# Patient Record
Sex: Female | Born: 1965 | Race: Black or African American | Hispanic: No | Smoking: Former smoker
Health system: Southern US, Community
[De-identification: ages and names within clinical notes are randomized; demographics above are authoritative.]

## PROBLEM LIST (undated history)

## (undated) DIAGNOSIS — R569 Unspecified convulsions: Secondary | ICD-10-CM

## (undated) DIAGNOSIS — K219 Gastro-esophageal reflux disease without esophagitis: Secondary | ICD-10-CM

## (undated) DIAGNOSIS — J309 Allergic rhinitis, unspecified: Secondary | ICD-10-CM

## (undated) DIAGNOSIS — R300 Dysuria: Secondary | ICD-10-CM

## (undated) DIAGNOSIS — G4730 Sleep apnea, unspecified: Secondary | ICD-10-CM

## (undated) DIAGNOSIS — F419 Anxiety disorder, unspecified: Secondary | ICD-10-CM

## (undated) DIAGNOSIS — R51 Headache: Secondary | ICD-10-CM

## (undated) DIAGNOSIS — D649 Anemia, unspecified: Secondary | ICD-10-CM

## (undated) DIAGNOSIS — I10 Essential (primary) hypertension: Secondary | ICD-10-CM

## (undated) DIAGNOSIS — L209 Atopic dermatitis, unspecified: Secondary | ICD-10-CM

## (undated) DIAGNOSIS — J45909 Unspecified asthma, uncomplicated: Secondary | ICD-10-CM

## (undated) DIAGNOSIS — C801 Malignant (primary) neoplasm, unspecified: Secondary | ICD-10-CM

## (undated) DIAGNOSIS — E538 Deficiency of other specified B group vitamins: Secondary | ICD-10-CM

## (undated) DIAGNOSIS — D120 Benign neoplasm of cecum: Secondary | ICD-10-CM

## (undated) DIAGNOSIS — E559 Vitamin D deficiency, unspecified: Secondary | ICD-10-CM

## (undated) DIAGNOSIS — F10250 Alcohol dependence with alcohol-induced psychotic disorder with delusions: Secondary | ICD-10-CM

## (undated) DIAGNOSIS — R519 Headache, unspecified: Secondary | ICD-10-CM

## (undated) DIAGNOSIS — G44019 Episodic cluster headache, not intractable: Secondary | ICD-10-CM

## (undated) DIAGNOSIS — D72819 Decreased white blood cell count, unspecified: Secondary | ICD-10-CM

## (undated) HISTORY — PX: VARICOSE VEIN SURGERY: SHX832

## (undated) HISTORY — DX: Decreased white blood cell count, unspecified: D72.819

## (undated) HISTORY — DX: Gastro-esophageal reflux disease without esophagitis: K21.9

## (undated) HISTORY — DX: Anemia, unspecified: D64.9

## (undated) HISTORY — DX: Anxiety disorder, unspecified: F41.9

## (undated) HISTORY — DX: Malignant (primary) neoplasm, unspecified: C80.1

## (undated) HISTORY — DX: Deficiency of other specified B group vitamins: E53.8

## (undated) HISTORY — DX: Essential (primary) hypertension: I10

## (undated) HISTORY — DX: Benign neoplasm of cecum: D12.0

---

## 1985-05-24 HISTORY — PX: THYROIDECTOMY: SHX17

## 1998-05-24 HISTORY — PX: HERNIA REPAIR: SHX51

## 2007-02-06 ENCOUNTER — Emergency Department (HOSPITAL_COMMUNITY): Admission: EM | Admit: 2007-02-06 | Discharge: 2007-02-06 | Payer: Self-pay | Admitting: Family Medicine

## 2008-01-05 ENCOUNTER — Ambulatory Visit: Payer: Self-pay | Admitting: Internal Medicine

## 2008-01-08 ENCOUNTER — Ambulatory Visit: Payer: Self-pay | Admitting: Internal Medicine

## 2008-04-19 ENCOUNTER — Emergency Department: Payer: Self-pay | Admitting: Emergency Medicine

## 2009-04-02 ENCOUNTER — Ambulatory Visit: Payer: Self-pay | Admitting: Internal Medicine

## 2010-05-24 HISTORY — PX: BREAST LUMPECTOMY: SHX2

## 2010-07-02 ENCOUNTER — Ambulatory Visit: Payer: Self-pay | Admitting: Internal Medicine

## 2010-07-07 ENCOUNTER — Ambulatory Visit: Payer: Self-pay | Admitting: Internal Medicine

## 2010-08-23 HISTORY — PX: OTHER SURGICAL HISTORY: SHX169

## 2010-08-23 HISTORY — PX: BREAST BIOPSY: SHX20

## 2010-08-27 ENCOUNTER — Ambulatory Visit: Payer: Self-pay | Admitting: Surgery

## 2010-09-08 ENCOUNTER — Ambulatory Visit: Payer: Self-pay | Admitting: Surgery

## 2010-09-09 LAB — PATHOLOGY REPORT

## 2011-02-15 ENCOUNTER — Ambulatory Visit: Payer: Self-pay | Admitting: Internal Medicine

## 2011-02-23 ENCOUNTER — Ambulatory Visit: Payer: Self-pay | Admitting: Internal Medicine

## 2011-03-25 ENCOUNTER — Ambulatory Visit: Payer: Self-pay | Admitting: Internal Medicine

## 2011-03-30 ENCOUNTER — Ambulatory Visit: Payer: Self-pay | Admitting: Otolaryngology

## 2011-07-08 ENCOUNTER — Ambulatory Visit: Payer: Self-pay | Admitting: Internal Medicine

## 2011-07-08 LAB — CBC CANCER CENTER
Basophil #: 0 x10 3/mm (ref 0.0–0.1)
Eosinophil #: 0 x10 3/mm (ref 0.0–0.7)
Lymphocyte %: 31 %
Monocyte #: 0.3 x10 3/mm (ref 0.0–0.7)
Monocyte %: 8.6 %
Neutrophil #: 1.9 x10 3/mm (ref 1.4–6.5)
Neutrophil %: 57.9 %
Platelet: 201 x10 3/mm (ref 150–440)
RDW: 15.3 % — ABNORMAL HIGH (ref 11.5–14.5)
WBC: 3.3 x10 3/mm — ABNORMAL LOW (ref 3.6–11.0)

## 2011-07-23 ENCOUNTER — Ambulatory Visit: Payer: Self-pay | Admitting: Internal Medicine

## 2011-12-31 ENCOUNTER — Ambulatory Visit: Payer: Self-pay | Admitting: Oncology

## 2012-08-17 ENCOUNTER — Ambulatory Visit: Payer: Self-pay | Admitting: Internal Medicine

## 2012-11-17 IMAGING — US US THYROID
1 series · 17 of 25 positions shown · non-contrast
Comparison: none

REASON FOR EXAM: thyroid nodule
COMMENTS:

[Series 1: us thyroid · 17 of 40 slices shown]
[im 1/40]
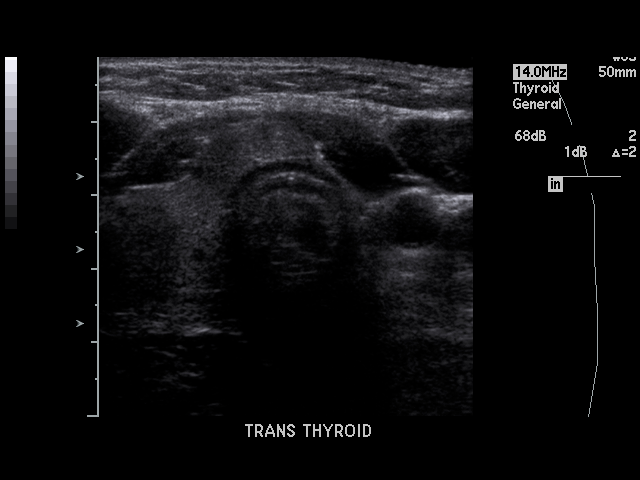
[im 4/40]
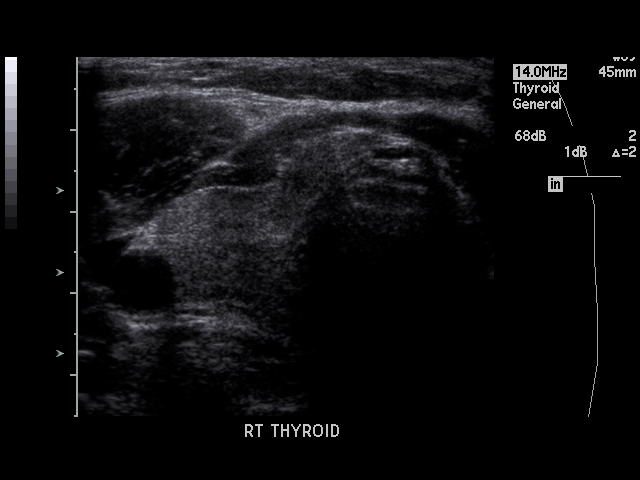
[im 5/40]
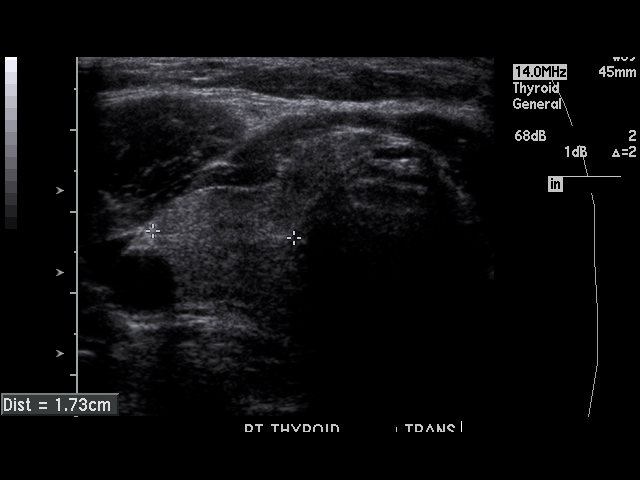
[im 9/40]
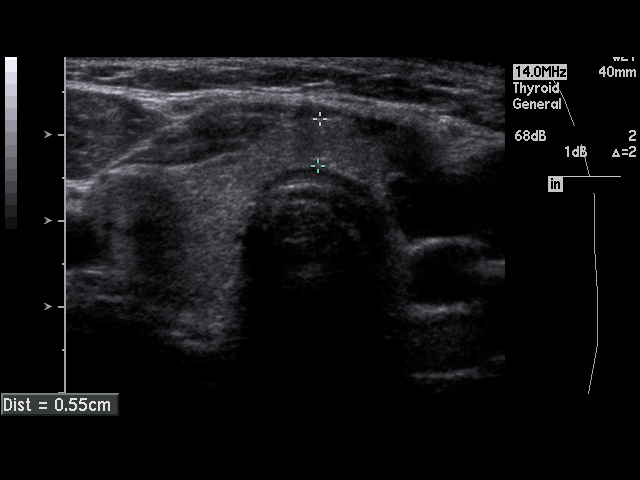
[im 10/40]
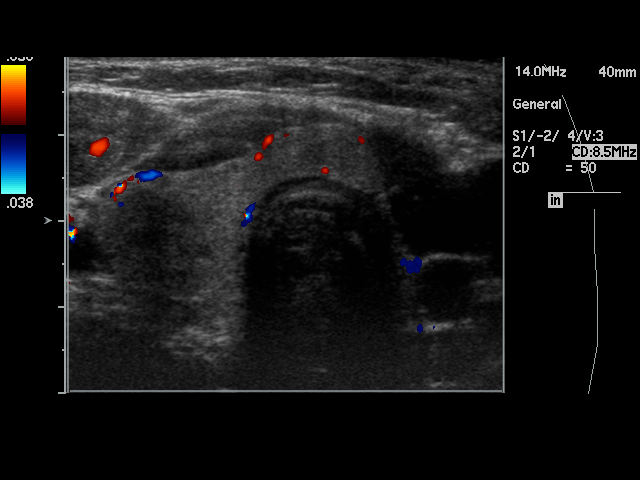
[im 14/40]
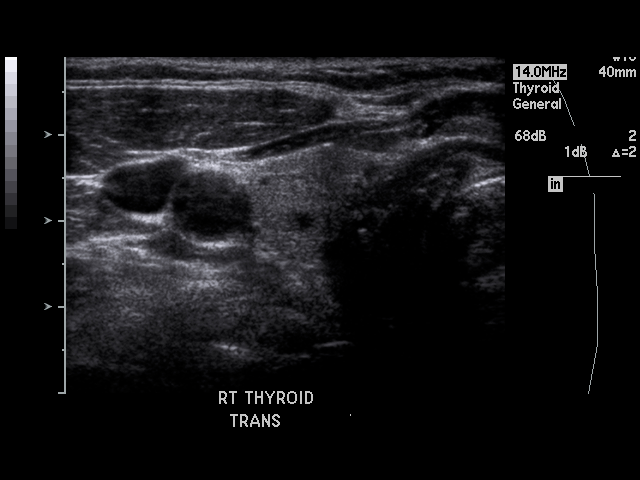
[im 15/40]
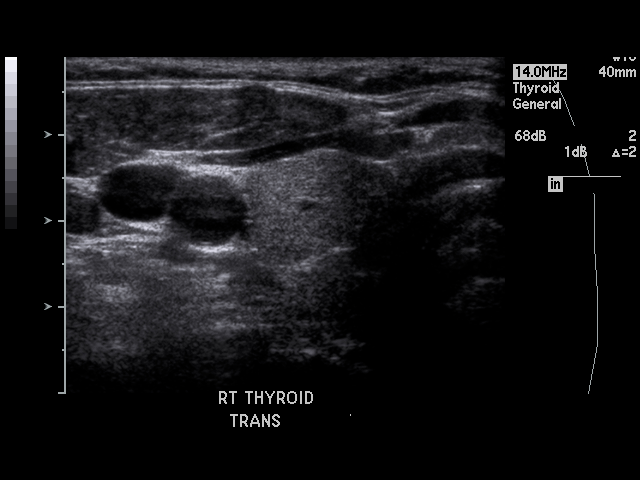
[im 18/40]
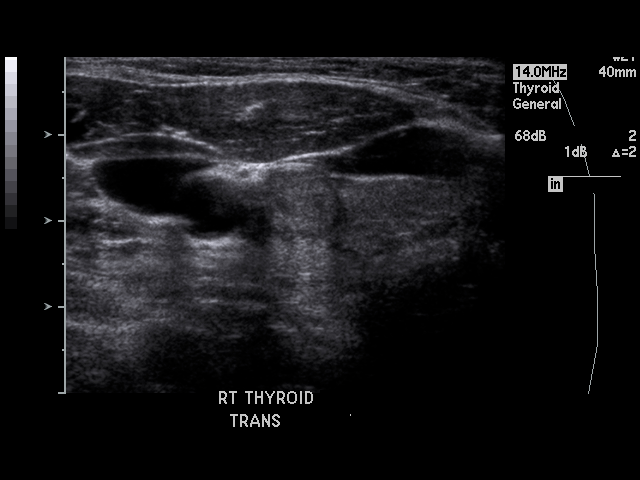
[im 20/40]
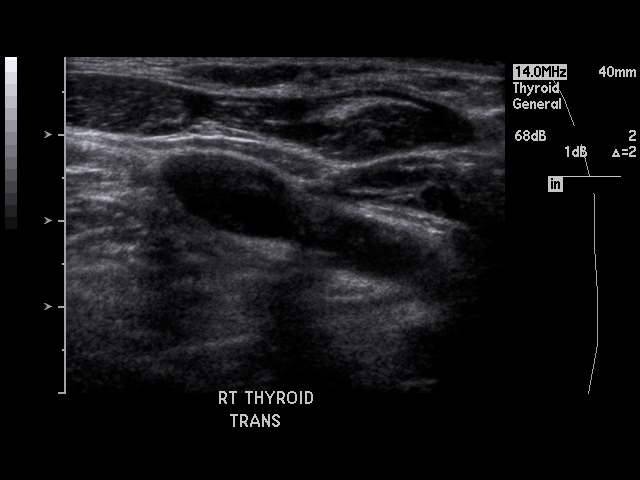
[im 22/40]
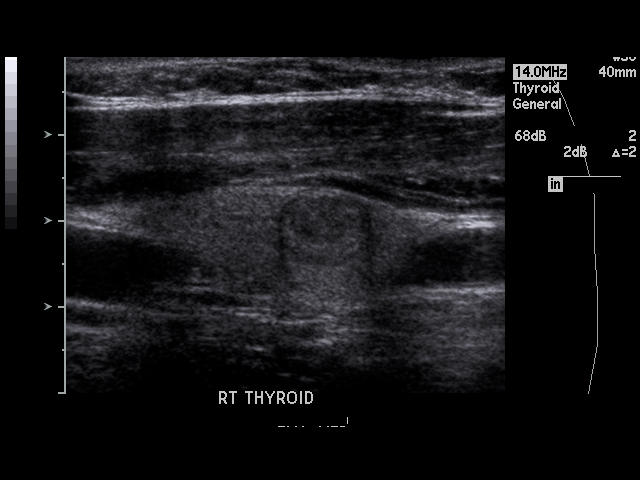
[im 25/40]
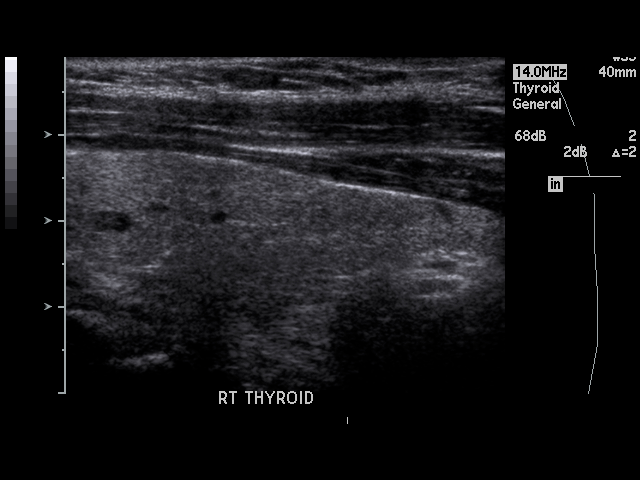
[im 27/40]
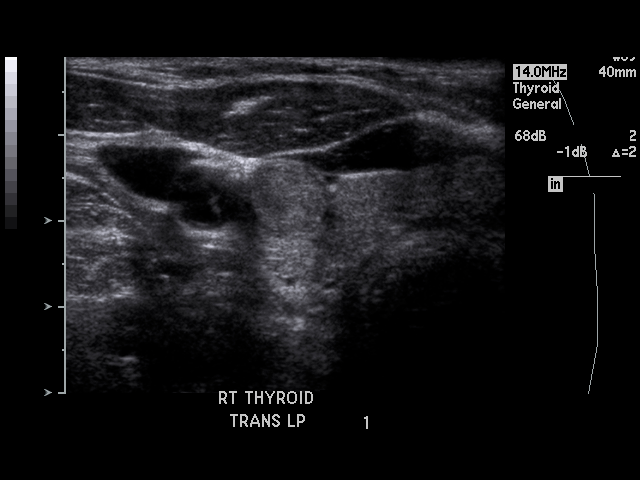
[im 30/40]
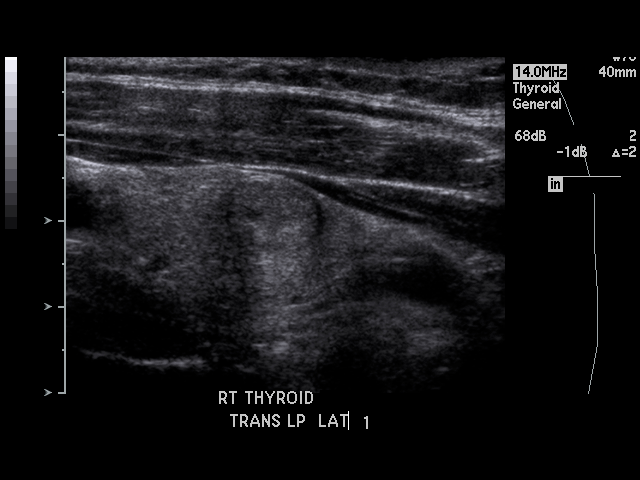
[im 31/40]
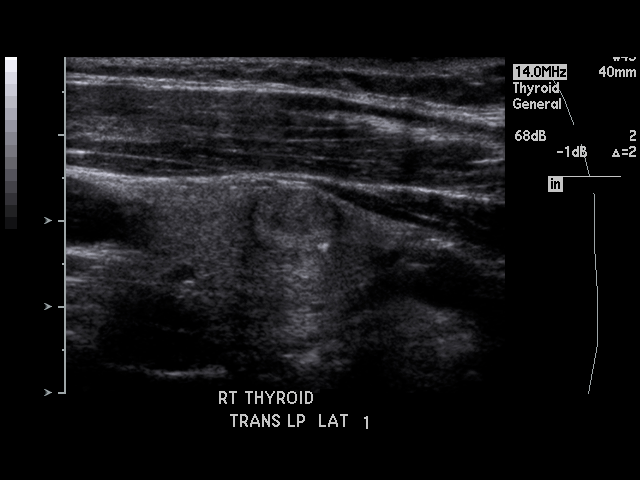
[im 35/40]
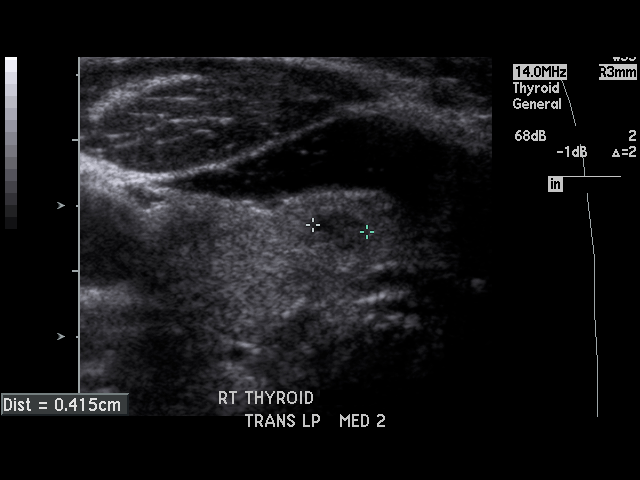
[im 36/40]
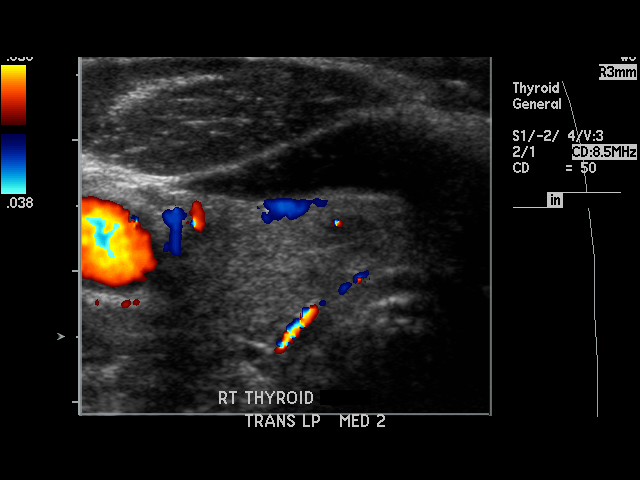
[im 40/40]
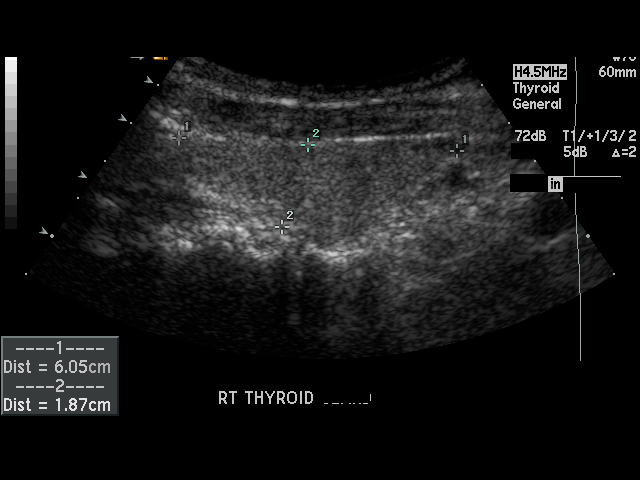

[17 of 25 positions shown; findings below may reference images not displayed]

PROCEDURE:     US  - US THYROID  - March 30, 2011  [DATE]

RESULT:     Sonographic interrogation of the thyroid is performed. There is
a history of left lobectomy. Comparison is made to the previous images dated
04/02/2009.

The right lobe measures 1.73 x 1.87 x 6.05 cm. The isthmus measures up to
0.55 cm anterior to posterior. Two areas of mixed echogenicity are seen in
the right lobe. One measures 0.88 x 1.11 x 0.89 cm. This is in the lower
pole laterally. The second area has a smaller measurement of 0.42 x 0.38 x
0.41 cm and is in the lower pole medially. Neither shows definite
calcification. Compared to the previous exam the nodules appear to be
relatively stable in size and sonographic appearance. No new nodules are
evident.
IMPRESSION: 1.     Stable solid nodules in the right lobe of the thyroid. Left lobectomy
changes are noted.

## 2014-02-07 ENCOUNTER — Ambulatory Visit: Payer: Self-pay | Admitting: Internal Medicine

## 2014-02-15 ENCOUNTER — Ambulatory Visit: Payer: Self-pay

## 2014-10-29 ENCOUNTER — Ambulatory Visit
Admission: RE | Admit: 2014-10-29 | Discharge: 2014-10-29 | Disposition: A | Discharge status: Home / Self Care | Payer: Medicaid Other | Source: Ambulatory Visit | Attending: Nurse Practitioner | Admitting: Nurse Practitioner

## 2014-10-29 ENCOUNTER — Other Ambulatory Visit: Payer: Self-pay | Admitting: Nurse Practitioner

## 2014-10-29 DIAGNOSIS — M25532 Pain in left wrist: Secondary | ICD-10-CM

## 2014-12-31 ENCOUNTER — Other Ambulatory Visit: Payer: Self-pay | Admitting: Nurse Practitioner

## 2014-12-31 DIAGNOSIS — R1903 Right lower quadrant abdominal swelling, mass and lump: Secondary | ICD-10-CM

## 2015-01-06 ENCOUNTER — Ambulatory Visit
Admission: RE | Admit: 2015-01-06 | Discharge: 2015-01-06 | Disposition: A | Discharge status: Home / Self Care | Payer: Medicaid Other | Source: Ambulatory Visit | Attending: Nurse Practitioner | Admitting: Nurse Practitioner

## 2015-01-06 DIAGNOSIS — R1903 Right lower quadrant abdominal swelling, mass and lump: Secondary | ICD-10-CM | POA: Diagnosis present

## 2015-01-06 HISTORY — DX: Unspecified asthma, uncomplicated: J45.909

## 2015-01-06 MED ORDER — IOHEXOL 300 MG/ML  SOLN
100.0000 mL | Freq: Once | INTRAMUSCULAR | Status: AC | PRN
  Administered 2015-01-06: 100 mL via INTRAVENOUS

## 2015-01-08 ENCOUNTER — Encounter: Payer: Self-pay | Admitting: General Surgery

## 2015-01-08 ENCOUNTER — Telehealth: Payer: Self-pay | Admitting: General Surgery

## 2015-01-08 ENCOUNTER — Ambulatory Visit (INDEPENDENT_AMBULATORY_CARE_PROVIDER_SITE_OTHER): Payer: Medicaid Other | Admitting: General Surgery

## 2015-01-08 VITALS — BP 110/78 | HR 78 | Resp 14 | Ht 66.0 in | Wt 182.0 lb

## 2015-01-08 DIAGNOSIS — K6389 Other specified diseases of intestine: Secondary | ICD-10-CM

## 2015-01-08 MED ORDER — POLYETHYLENE GLYCOL 3350 17 GM/SCOOP PO POWD: ORAL | Status: DC

## 2015-01-08 NOTE — Telephone Encounter (Signed)
Pt wanted to have her colonoscopy on Sept 28. She does not have any one available for transportation till then. She was advised on the potential acute problems related to intussusception which may warrant urgnt intervention. In view of this  She was encouraged to have the planned procedures done earlier than 6 weeks from now. Pt voiced understanding and will try to get someone to help her sooner.

## 2015-01-08 NOTE — Progress Notes (Signed)
Patient ID: Briana Snyder, female   DOB: February 22, 1966, 49 y.o.   MRN: [id number]  Chief Complaint  Patient presents with  . Abdominal Pain    HPI Briana Snyder is a 49 y.o. female here for evaluation of possible ileocecal mass. The patient has a history of abdominal pain for the last 2 years. She had an abdominal ultrasound done in her PCP office on 11/25/14 with abnormal findings. She then had a CT of the abdomen pelvis done on 01/06/15 which showed a possible ileal or cecal mass. She reports that the pain may come 1-2 times a month and has been increasing in intensity. She states that is may last from 20 minutes to 24 hours. She denies any nausea or vomiting. She reports that she has a bowel movement daily but she reports that her stools have been very thin.  HPI  Past Medical History  Diagnosis Date  . Asthma   . Anxiety   . GERD (gastroesophageal reflux disease)   . Anemia     Past Surgical History  Procedure Laterality Date  . Thyroidectomy  1987    Right  . Endometrial polyps  08/2010    resected  . Hernia repair  2000    inguinal   . Breast biopsy Left 08/2010    benign  . Breast lumpectomy Left 2012    Family History  Problem Relation Age of Onset  . Breast cancer Mother 38  . Prostate cancer Father     Social History Social History  Substance Use Topics  . Smoking status: Former Smoker -- 0.50 packs/day for 18 years    Types: Cigarettes    Quit date: 05/25/2011  . Smokeless tobacco: Never Used  . Alcohol Use: 0.0 oz/week    0 Standard drinks or equivalent per week    Not on File  Current Outpatient Prescriptions  Medication Sig Dispense Refill  . Acetaminophen-Codeine (TYLENOL/CODEINE #3) 300-30 MG per tablet Take 1 tablet by mouth 2 (two) times daily as needed for pain (headaches).    . busPIRone (BUSPAR) 10 MG tablet Take 1 tablet by mouth 2 (two) times daily.  0  . butalbital-aspirin-caffeine-codeine (BUTALBITAL COMPOUND/CODEINE) 50-325-40-30 MG capsule  Take 1 capsule by mouth 3 (three) times daily as needed for pain.    . clotrimazole-betamethasone (LOTRISONE) cream apply externally to affected area twice a day for 2 weeks  0  . fluticasone (FLONASE) 50 MCG/ACT nasal spray Place 1 spray into both nostrils daily.    Marland Kitchen omeprazole (PRILOSEC) 40 MG capsule Take 40 mg by mouth every evening.  0  . PROAIR HFA 108 (90 BASE) MCG/ACT inhaler Inhale 1 puff into the lungs every 4 (four) hours as needed.  0  . polyethylene glycol powder (GLYCOLAX/MIRALAX) powder 255 grams one bottle for colonoscopy prep 255 g 0   No current facility-administered medications for this visit.    Review of Systems Review of Systems  Constitutional: Negative.   Respiratory: Negative.   Cardiovascular: Negative.   Gastrointestinal: Positive for abdominal pain (right lower quadrant). Negative for nausea, vomiting, diarrhea, constipation, blood in stool, abdominal distention, anal bleeding and rectal pain.    Blood pressure 110/78, pulse 78, resp. rate 14, height $RemoveBe'5\' 6"'iTxNxqLUX$  (1.676 m), weight 182 lb (82.555 kg).  Physical Exam Physical Exam  Constitutional: She is oriented to person, place, and time. She appears well-developed and well-nourished.  Eyes: Conjunctivae are normal. No scleral icterus.  Neck: Neck supple.  Cardiovascular: Normal rate, regular rhythm and normal heart sounds.  Pulmonary/Chest: Effort normal and breath sounds normal.  Abdominal: Soft. Normal appearance and bowel sounds are normal. There is no hepatomegaly. There is tenderness (mild subjective in right side). No hernia.    Lymphadenopathy:    She has no cervical adenopathy.  Neurological: She is alert and oriented to person, place, and time.  Skin: Skin is warm and dry.    Data Reviewed CT of abdomen reviewed. 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 Colonoscopy indicated first. Likely will need surgical resection after if there is a cecal mass.  Colonoscopy with possible biopsy/polypectomy prn: Information regarding the procedure, including its potential risks and complications (including but not limited to perforation of the bowel, which may require emergency surgery to repair, and bleeding) was verbally given to the patient. Educational information regarding lower instestinal endoscopy was given to the patient. Written instructions for how to complete the bowel prep using Miralax were provided. The importance of drinking ample fluids to avoid dehydration as a result of the prep emphasized. The patient will be scheduled for a colonoscopy. Patient has to depend on transportation and they will be coming from out of town. This patient was given possible procedure dates and will call our office back when she is ready to proceed.   CBC, Met C, and CEA ordered at [facility] today.       PCP: Gerline Legacy G 01/08/2015, 1:12 PM

## 2015-01-08 NOTE — Patient Instructions (Addendum)
Colonoscopy A colonoscopy is an exam to look at the entire large intestine (colon). This exam can help find problems such as tumors, polyps, inflammation, and areas of bleeding. The exam takes about 1 hour.  LET [facility] CARE PROVIDER KNOW ABOUT:   Any allergies you have.  All medicines you are taking, including vitamins, herbs, eye drops, creams, and over-the-counter medicines.  Previous problems you or members of your family have had with the use of anesthetics.  Any blood disorders you have.  Previous surgeries you have had.  Medical conditions you have. RISKS AND COMPLICATIONS  Generally, this is a safe procedure. However, as with any procedure, complications can occur. Possible complications include:  Bleeding.  Tearing or rupture of the colon wall.  Reaction to medicines given during the exam.  Infection (rare). BEFORE THE PROCEDURE   Ask your health care provider about changing or stopping your regular medicines.  You may be prescribed an oral bowel prep. This involves drinking a large amount of medicated liquid, starting the day before your procedure. The liquid will cause you to have multiple loose stools until your stool is almost clear or light green. This cleans out your colon in preparation for the procedure.  Do not eat or drink anything else once you have started the bowel prep, unless your health care provider tells you it is safe to do so.  Arrange for someone to drive you home after the procedure. PROCEDURE   You will be given medicine to help you relax (sedative).  You will lie on your side with your knees bent.  A long, flexible tube with a light and camera on the end (colonoscope) will be inserted through the rectum and into the colon. The camera sends video back to a computer screen as it moves through the colon. The colonoscope also releases carbon dioxide gas to inflate the colon. This helps your health care provider see the area better.  During  the exam, your health care provider may take a small tissue sample (biopsy) to be examined under a microscope if any abnormalities are found.  The exam is finished when the entire colon has been viewed. AFTER THE PROCEDURE   Do not drive for 24 hours after the exam.  You may have a small amount of blood in your stool.  You may pass moderate amounts of gas and have mild abdominal cramping or bloating. This is caused by the gas used to inflate your colon during the exam.  Ask when your test results will be ready and how you will get your results. Make sure you get your test results. Document Released: 05/07/2000 Document Revised: 02/28/2013 Document Reviewed: 01/15/2013 [facility] Patient Information 2015 [place], [place]. This information is not intended to replace advice given to you by your health care provider. Make sure you discuss any questions you have with your health care provider.    Intussusception of colon: telescoping of bowel. (This occurs when a portion of the intestine slides into another portion of the intestine, could potentially be due to a mass or polyp. Once a colonoscopy is performed, will be able to further discuss this situation and plan for appropriate surgery).

## 2015-01-09 LAB — CBC WITH DIFFERENTIAL/PLATELET
BASOS: 1 %
Basophils Absolute: 0 10*3/uL (ref 0.0–0.2)
EOS (ABSOLUTE): 0.1 10*3/uL (ref 0.0–0.4)
EOS: 2 %
HEMATOCRIT: 42 % (ref 34.0–46.6)
Hemoglobin: 13.6 g/dL (ref 11.1–15.9)
IMMATURE GRANS (ABS): 0 10*3/uL (ref 0.0–0.1)
IMMATURE GRANULOCYTES: 0 %
Lymphocytes Absolute: 1.6 10*3/uL (ref 0.7–3.1)
Lymphs: 43 %
MCH: 26.3 pg — AB (ref 26.6–33.0)
MCHC: 32.4 g/dL (ref 31.5–35.7)
MCV: 81 fL (ref 79–97)
MONOS ABS: 0.4 10*3/uL (ref 0.1–0.9)
Monocytes: 12 %
NEUTROS ABS: 1.6 10*3/uL (ref 1.4–7.0)
NEUTROS PCT: 42 %
Platelets: 212 10*3/uL (ref 150–379)
RBC: 5.18 x10E6/uL (ref 3.77–5.28)
RDW: 17.2 % — ABNORMAL HIGH (ref 12.3–15.4)
WBC: 3.6 10*3/uL (ref 3.4–10.8)

## 2015-01-09 LAB — CEA: CEA: 2.2 ng/mL (ref 0.0–4.7)

## 2015-01-09 LAB — COMPREHENSIVE METABOLIC PANEL
A/G RATIO: 1.5 (ref 1.1–2.5)
ALT: 9 IU/L (ref 0–32)
AST: 20 IU/L (ref 0–40)
Albumin: 4.6 g/dL (ref 3.5–5.5)
Alkaline Phosphatase: 97 IU/L (ref 39–117)
BUN / CREAT RATIO: 8 — AB (ref 9–23)
BUN: 8 mg/dL (ref 6–24)
Bilirubin Total: 0.3 mg/dL (ref 0.0–1.2)
CALCIUM: 9.5 mg/dL (ref 8.7–10.2)
CO2: 24 mmol/L (ref 18–29)
Chloride: 100 mmol/L (ref 97–108)
Creatinine, Ser: 0.98 mg/dL (ref 0.57–1.00)
GFR, EST AFRICAN AMERICAN: 78 mL/min/{1.73_m2} (ref >59)
GFR, EST NON AFRICAN AMERICAN: 67 mL/min/{1.73_m2} (ref >59)
GLOBULIN, TOTAL: 3 g/dL (ref 1.5–4.5)
Glucose: 84 mg/dL (ref 65–99)
POTASSIUM: 5.3 mmol/L — AB (ref 3.5–5.2)
SODIUM: 142 mmol/L (ref 134–144)
TOTAL PROTEIN: 7.6 g/dL (ref 6.0–8.5)

## 2015-01-10 ENCOUNTER — Telehealth: Payer: Self-pay

## 2015-01-10 NOTE — Telephone Encounter (Signed)
Patient called to reschedule her colonoscopy scheduled for 02/19/15. Patient is now rescheduled for a colonoscopy at [facility] for 01/28/15. Instructions reviewed with patient. She will call back with any questions. Patient is aware of date and instructoins.

## 2015-01-21 ENCOUNTER — Other Ambulatory Visit: Payer: Self-pay | Admitting: General Surgery

## 2015-01-21 DIAGNOSIS — R933 Abnormal findings on diagnostic imaging of other parts of digestive tract: Secondary | ICD-10-CM

## 2015-01-28 ENCOUNTER — Ambulatory Visit
Admission: RE | Admit: 2015-01-28 | Discharge: 2015-01-28 | Disposition: A | Discharge status: Home / Self Care | Payer: Medicaid Other | Source: Ambulatory Visit | Attending: General Surgery | Admitting: General Surgery

## 2015-01-28 ENCOUNTER — Encounter (HOSPITAL_BASED_OUTPATIENT_CLINIC_OR_DEPARTMENT_OTHER): Payer: Medicaid Other | Admitting: General Surgery

## 2015-01-28 ENCOUNTER — Encounter: Payer: Self-pay | Admitting: *Deleted

## 2015-01-28 ENCOUNTER — Ambulatory Visit: Payer: Medicaid Other | Admitting: Anesthesiology

## 2015-01-28 ENCOUNTER — Encounter
Admission: RE | Disposition: A | Discharge status: Home / Self Care | Payer: Self-pay | Source: Ambulatory Visit | Attending: General Surgery

## 2015-01-28 DIAGNOSIS — D120 Benign neoplasm of cecum: Secondary | ICD-10-CM | POA: Diagnosis not present

## 2015-01-28 DIAGNOSIS — J45909 Unspecified asthma, uncomplicated: Secondary | ICD-10-CM | POA: Diagnosis not present

## 2015-01-28 DIAGNOSIS — K5730 Diverticulosis of large intestine without perforation or abscess without bleeding: Secondary | ICD-10-CM | POA: Diagnosis not present

## 2015-01-28 DIAGNOSIS — K561 Intussusception: Secondary | ICD-10-CM | POA: Insufficient documentation

## 2015-01-28 DIAGNOSIS — Z01818 Encounter for other preprocedural examination: Secondary | ICD-10-CM

## 2015-01-28 DIAGNOSIS — F419 Anxiety disorder, unspecified: Secondary | ICD-10-CM | POA: Insufficient documentation

## 2015-01-28 DIAGNOSIS — D649 Anemia, unspecified: Secondary | ICD-10-CM | POA: Diagnosis not present

## 2015-01-28 DIAGNOSIS — K219 Gastro-esophageal reflux disease without esophagitis: Secondary | ICD-10-CM | POA: Insufficient documentation

## 2015-01-28 DIAGNOSIS — R109 Unspecified abdominal pain: Secondary | ICD-10-CM | POA: Diagnosis present

## 2015-01-28 DIAGNOSIS — R933 Abnormal findings on diagnostic imaging of other parts of digestive tract: Secondary | ICD-10-CM

## 2015-01-28 DIAGNOSIS — Z87891 Personal history of nicotine dependence: Secondary | ICD-10-CM | POA: Insufficient documentation

## 2015-01-28 HISTORY — PX: COLONOSCOPY: SHX5424

## 2015-01-28 SURGERY — COLONOSCOPY
Anesthesia: General

## 2015-01-28 MED ORDER — MIDAZOLAM HCL 2 MG/2ML IJ SOLN
INTRAMUSCULAR | Status: DC | PRN
  Administered 2015-01-28: 2 mg via INTRAVENOUS

## 2015-01-28 MED ORDER — FENTANYL CITRATE (PF) 100 MCG/2ML IJ SOLN
INTRAMUSCULAR | Status: DC | PRN
  Administered 2015-01-28: 50 ug via INTRAVENOUS
  Administered 2015-01-28 (×2): 25 ug via INTRAVENOUS

## 2015-01-28 MED ORDER — PROPOFOL INFUSION 10 MG/ML OPTIME
INTRAVENOUS | Status: DC | PRN
  Administered 2015-01-28: 120 ug/kg/min via INTRAVENOUS

## 2015-01-28 MED ORDER — LACTATED RINGERS IV SOLN
INTRAVENOUS | Status: DC
  Administered 2015-01-28: 13:00:00 via INTRAVENOUS
  Administered 2015-01-28: 1000 mL via INTRAVENOUS

## 2015-01-28 NOTE — Op Note (Signed)
[facility] Gastroenterology Patient Name: Briana Snyder Procedure Date: 01/28/2015 12:33 PM MRN: [id number] Account #: [id number] Date of Birth: 1966/03/12 Admit Type: Outpatient Age: 49 Room: [facility] ENDO ROOM 1 Gender: Female Note Status: Finalized Procedure:         Colonoscopy Indications:       Abnormal CT of the GI tract, Preoperative assessment Providers:         Orlie Pollen, MD Referring MD:      No Local Md, MD (Referring MD) Medicines:         General Anesthesia Complications:     No immediate complications. Procedure:         Pre-Anesthesia Assessment:                    - General anesthesia under the supervision of an                     anesthesiologist was determined to be medically necessary                     for this procedure based on review of the patient's                     medical history, medications, and prior anesthesia history.                    After obtaining informed consent, the colonoscope was                     passed under direct vision. Throughout the procedure, the                     patient's blood pressure, pulse, and oxygen saturations                     were monitored continuously. The Colonoscope was                     introduced through the anus and advanced to the the cecum,                     identified by the ileocecal valve. The colonoscopy was                     performed without difficulty. The patient tolerated the                     procedure well. The quality of the bowel preparation was                     good. Findings:      The perianal and digital rectal examinations were normal.      Multiple small-mouthed diverticula were found in the sigmoid colon and       in the descending colon.      A [Size] non-bleeding polyp was found at the ileocecal valve. Biopsies       were taken with a cold forceps for histology.      The exam was otherwise without abnormality. Impression:        -  Diverticulosis in the sigmoid colon and in the                     descending colon.                    -  One, non-bleeding polyp at the ileocecal valve. Biopsied.                    - The examination was otherwise normal. Recommendation:    - Await pathology results.                    - Return to endoscopist in 1 week. Procedure Code(s): --- Professional ---                    [phone], Colonoscopy, flexible; with biopsy, single or                     multiple Diagnosis Code(s): --- Professional ---                    D12.0, Benign neoplasm of cecum                    Z01.818, Encounter for other preprocedural examination                    K57.30, Diverticulosis of large intestine without                     perforation or abscess without bleeding                    R93.3, Abnormal findings on diagnostic imaging of other                     parts of digestive tract CPT copyright 2014 American Medical Association. All rights reserved. The codes documented in this report are preliminary and upon coder review may  be revised to meet current compliance requirements. Orlie Pollen, MD 01/28/2015 2:42:59 PM This report has been signed electronically. Number of Addenda: 0 Note Initiated On: 01/28/2015 12:33 PM Scope Withdrawal Time: 0 hours 0 minutes 24 seconds  Total Procedure Duration: 0 hours 26 minutes 35 seconds       [facility]

## 2015-01-28 NOTE — Anesthesia Preprocedure Evaluation (Signed)
Anesthesia Evaluation  Patient identified by MRN, date of birth, ID band Patient awake    Reviewed: Allergy & Precautions, H&P , NPO status , Patient's Chart, lab work & pertinent test results  History of Anesthesia Complications Negative for: history of anesthetic complications  Airway Mallampati: II  TM Distance: >3 FB Neck ROM: full    Dental  (+) Poor Dentition, Chipped   Pulmonary neg shortness of breath, asthma , former smoker,  breath sounds clear to auscultation  Pulmonary exam normal       Cardiovascular Exercise Tolerance: Good - Past MI negative cardio ROS Normal cardiovascular examRhythm:regular Rate:Normal     Neuro/Psych PSYCHIATRIC DISORDERS Anxiety negative neurological ROS     GI/Hepatic Neg liver ROS, GERD-  Controlled,  Endo/Other  negative endocrine ROS  Renal/GU negative Renal ROS  negative genitourinary   Musculoskeletal   Abdominal   Peds  Hematology negative hematology ROS (+)   Anesthesia Other Findings Past Medical History:   Asthma                                                       Anxiety                                                      GERD (gastroesophageal reflux disease)                       Anemia                                                       Reproductive/Obstetrics negative OB ROS                             Anesthesia Physical Anesthesia Plan  ASA: III  Anesthesia Plan: General   Post-op Pain Management:    Induction:   Airway Management Planned:   Additional Equipment:   Intra-op Plan:   Post-operative Plan:   Informed Consent: I have reviewed the patients History and Physical, chart, labs and discussed the procedure including the risks, benefits and alternatives for the proposed anesthesia with the patient or authorized representative who has indicated his/her understanding and acceptance.   Dental Advisory  Given  Plan Discussed with: Anesthesiologist, CRNA and Surgeon  Anesthesia Plan Comments:         Anesthesia Quick Evaluation

## 2015-01-28 NOTE — Interval H&P Note (Signed)
History and Physical Interval Note:  01/28/2015 2:45 PM  Briana Snyder  has presented today for surgery, with the diagnosis of CECAL MASS ON CT SCAN  The various methods of treatment have been discussed with the patient and family. After consideration of risks, benefits and other options for treatment, the patient has consented to  Procedure(s): COLONOSCOPY (N/A) as a surgical intervention .  The patient's history has been reviewed, patient examined, no change in status, stable for surgery.  I have reviewed the patient's chart and labs.  Questions were answered to the patient's satisfaction.     Gwenneth Whiteman G

## 2015-01-28 NOTE — Anesthesia Procedure Notes (Signed)
Performed by: COOK-MARTIN, Rigel Filsinger Pre-anesthesia Checklist: Patient identified, Emergency Drugs available, Suction available, Patient being monitored and Timeout performed Patient Re-evaluated:Patient Re-evaluated prior to inductionOxygen Delivery Method: Nasal cannula Preoxygenation: Pre-oxygenation with 100% oxygen Intubation Type: IV induction Placement Confirmation: positive ETCO2 and CO2 detector       

## 2015-01-28 NOTE — H&P (View-Only) (Signed)
Patient ID: Briana Snyder, female   DOB: 16-Oct-1965, 49 y.o.   MRN: [id number]  Chief Complaint  Patient presents with  . Abdominal Pain    HPI Briana Snyder is a 49 y.o. female here for evaluation of possible ileocecal mass. The patient has a history of abdominal pain for the last 2 years. She had an abdominal ultrasound done in her PCP office on 11/25/14 with abnormal findings. She then had a CT of the abdomen pelvis done on 01/06/15 which showed a possible ileal or cecal mass. She reports that the pain may come 1-2 times a month and has been increasing in intensity. She states that is may last from 20 minutes to 24 hours. She denies any nausea or vomiting. She reports that she has a bowel movement daily but she reports that her stools have been very thin.  HPI  Past Medical History  Diagnosis Date  . Asthma   . Anxiety   . GERD (gastroesophageal reflux disease)   . Anemia     Past Surgical History  Procedure Laterality Date  . Thyroidectomy  1987    Right  . Endometrial polyps  08/2010    resected  . Hernia repair  2000    inguinal   . Breast biopsy Left 08/2010    benign  . Breast lumpectomy Left 2012    Family History  Problem Relation Age of Onset  . Breast cancer Mother 92  . Prostate cancer Father     Social History Social History  Substance Use Topics  . Smoking status: Former Smoker -- 0.50 packs/day for 18 years    Types: Cigarettes    Quit date: 05/25/2011  . Smokeless tobacco: Never Used  . Alcohol Use: 0.0 oz/week    0 Standard drinks or equivalent per week    Not on File  Current Outpatient Prescriptions  Medication Sig Dispense Refill  . Acetaminophen-Codeine (TYLENOL/CODEINE #3) 300-30 MG per tablet Take 1 tablet by mouth 2 (two) times daily as needed for pain (headaches).    . busPIRone (BUSPAR) 10 MG tablet Take 1 tablet by mouth 2 (two) times daily.  0  . butalbital-aspirin-caffeine-codeine (BUTALBITAL COMPOUND/CODEINE) 50-325-40-30 MG capsule  Take 1 capsule by mouth 3 (three) times daily as needed for pain.    . clotrimazole-betamethasone (LOTRISONE) cream apply externally to affected area twice a day for 2 weeks  0  . fluticasone (FLONASE) 50 MCG/ACT nasal spray Place 1 spray into both nostrils daily.    Marland Kitchen omeprazole (PRILOSEC) 40 MG capsule Take 40 mg by mouth every evening.  0  . PROAIR HFA 108 (90 BASE) MCG/ACT inhaler Inhale 1 puff into the lungs every 4 (four) hours as needed.  0  . polyethylene glycol powder (GLYCOLAX/MIRALAX) powder 255 grams one bottle for colonoscopy prep 255 g 0   No current facility-administered medications for this visit.    Review of Systems Review of Systems  Constitutional: Negative.   Respiratory: Negative.   Cardiovascular: Negative.   Gastrointestinal: Positive for abdominal pain (right lower quadrant). Negative for nausea, vomiting, diarrhea, constipation, blood in stool, abdominal distention, anal bleeding and rectal pain.    Blood pressure 110/78, pulse 78, resp. rate 14, height $RemoveBe'5\' 6"'uwvrvUTDP$  (1.676 m), weight 182 lb (82.555 kg).  Physical Exam Physical Exam  Constitutional: She is oriented to person, place, and time. She appears well-developed and well-nourished.  Eyes: Conjunctivae are normal. No scleral icterus.  Neck: Neck supple.  Cardiovascular: Normal rate, regular rhythm and normal heart sounds.  Pulmonary/Chest: Effort normal and breath sounds normal.  Abdominal: Soft. Normal appearance and bowel sounds are normal. There is no hepatomegaly. There is tenderness (mild subjective in right side). No hernia.    Lymphadenopathy:    She has no cervical adenopathy.  Neurological: She is alert and oriented to person, place, and time.  Skin: Skin is warm and dry.    Data Reviewed CT of abdomen reviewed. 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 Colonoscopy indicated first. Likely will need surgical resection after if there is a cecal mass.  Colonoscopy with possible biopsy/polypectomy prn: Information regarding the procedure, including its potential risks and complications (including but not limited to perforation of the bowel, which may require emergency surgery to repair, and bleeding) was verbally given to the patient. Educational information regarding lower instestinal endoscopy was given to the patient. Written instructions for how to complete the bowel prep using Miralax were provided. The importance of drinking ample fluids to avoid dehydration as a result of the prep emphasized. The patient will be scheduled for a colonoscopy. Patient has to depend on transportation and they will be coming from out of town. This patient was given possible procedure dates and will call our office back when she is ready to proceed.   CBC, Met C, and CEA ordered at [facility] today.       PCP: Gerline Legacy G 01/08/2015, 1:12 PM

## 2015-01-28 NOTE — Transfer of Care (Signed)
Immediate Anesthesia Transfer of Care Note  Patient: Briana Snyder  Procedure(s) Performed: Procedure(s): COLONOSCOPY (N/A)  Patient Location: PACU  Anesthesia Type:General  Level of Consciousness: awake, alert , oriented and sedated  Airway & Oxygen Therapy: Patient Spontanous Breathing and Patient connected to nasal cannula oxygen  Post-op Assessment: Report given to RN and Post -op Vital signs reviewed and stable  Post vital signs: Reviewed and stable  Last Vitals:  Filed Vitals:   01/28/15 1226  BP: 119/93  Pulse: 73  Temp: 37 C    Complications: no anesthesia problems

## 2015-01-30 LAB — SURGICAL PATHOLOGY

## 2015-01-31 NOTE — Anesthesia Postprocedure Evaluation (Signed)
  Anesthesia Post-op Note  Patient: Briana Snyder  Procedure(s) Performed: Procedure(s): COLONOSCOPY (N/A)  Anesthesia type:General  Patient location: PACU  Post pain: Pain level controlled  Post assessment: Post-op Vital signs reviewed, Patient's Cardiovascular Status Stable, Respiratory Function Stable, Patent Airway and No signs of Nausea or vomiting  Post vital signs: Reviewed and stable  Last Vitals:  Filed Vitals:   01/28/15 1513  BP: 131/96  Pulse: 83  Temp:   Resp: 16    Level of consciousness: awake, alert  and patient cooperative  Complications: No apparent anesthesia complications

## 2015-02-04 ENCOUNTER — Ambulatory Visit (INDEPENDENT_AMBULATORY_CARE_PROVIDER_SITE_OTHER): Payer: Medicaid Other | Admitting: General Surgery

## 2015-02-04 ENCOUNTER — Encounter: Payer: Self-pay | Admitting: General Surgery

## 2015-02-04 VITALS — BP 132/82 | HR 88 | Resp 16 | Ht 66.5 in | Wt 183.0 lb

## 2015-02-04 DIAGNOSIS — K635 Polyp of colon: Secondary | ICD-10-CM

## 2015-02-04 DIAGNOSIS — K6389 Other specified diseases of intestine: Secondary | ICD-10-CM

## 2015-02-04 MED ORDER — METRONIDAZOLE 500 MG PO TABS: 500.0000 mg | ORAL_TABLET | Freq: Once | ORAL | Status: DC

## 2015-02-04 MED ORDER — POLYETHYLENE GLYCOL 3350 17 GM/SCOOP PO POWD: 1.0000 | Freq: Once | ORAL | Status: DC

## 2015-02-04 MED ORDER — NEOMYCIN SULFATE 500 MG PO TABS: 500.0000 mg | ORAL_TABLET | Freq: Once | ORAL | Status: DC

## 2015-02-04 NOTE — Patient Instructions (Addendum)
The patient is aware to call back for any questions or concerns. Right colectomy Laparoscopic Colectomy Laparoscopic colectomy is surgery to remove part or all of the large intestine (colon). This procedure is used to treat several conditions, including:  Inflammation and infection of the colon (diverticulitis).  Tumors or masses in the colon.  Inflammatory bowel disease, such as Crohn disease or ulcerative colitis. Colectomy is an option when symptoms cannot be controlled with medicines.  Bleeding from the colon that cannot be controlled by another method.  Blockage or obstruction of the colon. LET [facility] CARE PROVIDER KNOW ABOUT:  Any allergies you have.  All medicines you are taking, including vitamins, herbs, eye drops, creams, and over-the-counter medicines.  Previous problems you or members of your family have had with the use of anesthetics.  Any blood disorders you have.  Previous surgeries you have had.  Medical conditions you have. RISKS AND COMPLICATIONS Generally, this is a safe procedure. However, as with any procedure, complications can occur. Possible complications include:  Infection.  Bleeding.  Damage to other organs.  Leaking from where the colon was sewn together.  Future blockage of the small intestines from scar tissue. Another surgery may be needed to repair this. In some cases, complications such as damage to other organs or excessive bleeding may require the surgeon to convert from a laparoscopic procedure to an open procedure. This involves making a larger incision in the abdomen to perform the procedure. BEFORE THE PROCEDURE  Ask your health care provider about changing or stopping any regular medicines.  You may be prescribed an oral bowel prep. This involves drinking a large amount of medicated liquid, starting the day before your surgery. The liquid will cause you to have multiple loose stools until your stool is almost clear or light  green. This cleans out your colon in preparation for the surgery.  Do not eat or drink anything else once you have started the bowel prep, unless your health care provider tells you it is safe to do so.  You may also be given antibiotic pills to clean out your colon of bacteria. Be sure to follow the directions carefully and take the medicine at the correct time. PROCEDURE   Small monitors will be put on your body. They are used to check your heart, blood pressure, and oxygen level.  An IV access tube will be put into one of your veins. Medicine will be able to flow directly into your body through this IV tube.  You might be given a medicine to help you relax (sedative).  You will be given a medicine to make you sleep through the procedure (general anesthetic). A breathing tube may be placed into your lungs during the procedure.  A thin, flexible tube (catheter) will be placed into your bladder to collect urine.  A tube may be put in through your nose. It is called a nasogastric tube. It is used to remove stomach fluids after surgery until the intestines start working again.  Your abdomen will be filled with air so that it expands. This gives the surgeon more room to operate and makes your organs easier to see.  Several small cuts (incisions) are made in your abdomen.  A thin, lighted tube with a tiny camera on the end (laparoscope) is put through one of the small incisions. The camera on the laparoscope sends a picture to a TV screen in the operating room. This gives the surgeon a good view inside your abdomen.  Hollow tubes are put through the other small incisions in your abdomen. The tools needed for the procedure are put through these tubes.  Clamps or staples are put on both ends of the diseased part of the colon.  The part of the intestine between the clamps or staples is removed.  If possible, the ends of the healthy colon that remain will be stitched or stapled together to  allow your body to expel waste (stool).  Sometimes, the remaining colon cannot be stitched back together. If this is the case, a colostomy is needed. For a colostomy:  An opening (stoma) to the outside of your body is made through the abdomen.  The end of the colon is brought to the opening. It is stitched to the skin.  A bag is attached to the opening. Stool will drain into this bag. The bag is removable.  The colostomy can be temporary or permanent.  The incisions from the colectomy are closed with stitches or staples. AFTER THE PROCEDURE  You will be monitored closely in a recovery area until you are stable and doing well. You will then be moved to a regular hospital room.  You will need to receive fluids through an IV tube until your bowel function has returned. This may take 1-3 days. Once your bowels are working again, you will be started on clear liquids and then advanced to solid food as tolerated.  You will be given pain medicines to control your pain. Document Released: 07/31/2002 Document Revised: 02/28/2013 Document Reviewed: 12/20/2012 [facility] Patient Information 2015 [place], [place]. This information is not intended to replace advice given to you by your health care provider. Make sure you discuss any questions you have with your health care provider.  Patient is scheduled for surgery at [facility] on 02/25/15. She will do her bowel prep on 02/24/15 as instructed. She is aware to call the day before surgery for her arrival time. prescriptions for Miralax, Flagyl, and Neomycin have been sent into her pharmacy. Patient is aware of date and instructions.

## 2015-02-04 NOTE — Progress Notes (Signed)
Patient ID: Briana Snyder, female   DOB: 03-19-1966, 49 y.o.   MRN: [id number]  Chief Complaint  Patient presents with  . Follow-up    HPI Briana Snyder is a 50 y.o. female.  Here today for postoperative visit, colonoscopy on 01-28-15 for colonic mass. She states she is doing well. 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   HPI  Past Medical History  Diagnosis Date  . Asthma   . Anxiety   . GERD (gastroesophageal reflux disease)   . Anemia     Past Surgical History  Procedure Laterality Date  . Thyroidectomy  1987    Right  . Endometrial polyps  08/2010    resected  . Hernia repair  2000    inguinal   . Breast biopsy Left 08/2010    benign  . Breast lumpectomy Left 2012  . Colonoscopy N/A 01/28/2015    Procedure: COLONOSCOPY;  Surgeon: Christene Lye, MD;  Location: [facility] ENDOSCOPY;  Service: Endoscopy;  Laterality: N/A;    Family History  Problem Relation Age of Onset  . Breast cancer Mother 76  . Prostate cancer Father     Social History Social History  Substance Use Topics  . Smoking status: Former Smoker -- 0.50 packs/day for 18 years    Types: Cigarettes    Quit date: 05/25/2011  . Smokeless tobacco: Never Used  . Alcohol Use: 0.0 oz/week    0 Standard drinks or equivalent per week    No Known Allergies  Current Outpatient Prescriptions  Medication Sig Dispense Refill  . Acetaminophen-Codeine (TYLENOL/CODEINE #3) 300-30 MG per tablet Take 1 tablet by mouth 2 (two) times daily as needed for pain (headaches).    . busPIRone (BUSPAR) 10 MG tablet Take 1 tablet by mouth 2 (two) times daily.  0  . butalbital-aspirin-caffeine-codeine (BUTALBITAL COMPOUND/CODEINE) 50-325-40-30 MG capsule Take 1 capsule by mouth 3 (three) times daily as needed for pain.    . clotrimazole-betamethasone (LOTRISONE) cream apply externally to affected area twice a day for 2 weeks  0  . fluticasone (FLONASE) 50 MCG/ACT nasal spray Place 1 spray  into both nostrils daily.    Marland Kitchen omeprazole (PRILOSEC) 40 MG capsule Take 40 mg by mouth every evening.  0  . PROAIR HFA 108 (90 BASE) MCG/ACT inhaler Inhale 1 puff into the lungs every 4 (four) hours as needed.  0   No current facility-administered medications for this visit.    Review of Systems Review of Systems  Constitutional: Negative.   Respiratory: Negative.   Cardiovascular: Negative.     Blood pressure 132/82, pulse 88, resp. rate 16, height 5' 6.5" (1.689 m), weight 183 lb (83.008 kg).  Physical Exam Physical Exam  Constitutional: She is oriented to person, place, and time. She appears well-developed and well-nourished.  HENT:  Mouth/Throat: Oropharynx is clear and moist.  Eyes: Conjunctivae are normal. No scleral icterus.  Neck: Neck supple.  Cardiovascular: Normal rate, regular rhythm and normal heart sounds.   No lower leg edema.  Pulmonary/Chest: Effort normal and breath sounds normal.  Abdominal: Soft. Bowel sounds are normal. There is no hepatomegaly. There is no tenderness.  Lymphadenopathy:    She has no cervical adenopathy.  Neurological: She is alert and oriented to person, place, and time.  Skin: Skin is warm and dry.  Psychiatric: Her behavior is normal.    Data Reviewed CT scan and colonoscopy.  Assessment    Cecal polyp with dysplasia and intussusception  Plan   Recommended laparoscopy, right colectomy.  Discussed risk and benefits of surgery in detail. Pt is agreeable.  Patient is scheduled for surgery at [facility] on 02/25/15. She will do her bowel prep on 02/24/15 as instructed. She is aware to call the day before surgery for her arrival time. prescriptions for Miralax, Flagyl, and Neomycin have been sent into her pharmacy. Patient is aware of date and instructions.   Cc: PCP  Shandell Giovanni G 02/04/2015, 9:54 AM

## 2015-02-04 NOTE — Progress Notes (Deleted)
Here today for postoperative visit, colonoscopy on 01-28-15. She states she is doing well.

## 2015-02-06 ENCOUNTER — Telehealth: Payer: Self-pay | Admitting: *Deleted

## 2015-02-06 NOTE — Telephone Encounter (Signed)
error 

## 2015-02-17 ENCOUNTER — Other Ambulatory Visit: Payer: Medicaid Other

## 2015-02-24 ENCOUNTER — Encounter: Payer: Self-pay | Admitting: *Deleted

## 2015-02-24 ENCOUNTER — Encounter
Admission: RE | Admit: 2015-02-24 | Discharge: 2015-02-24 | Disposition: A | Discharge status: Home / Self Care | Payer: Medicaid Other | Source: Ambulatory Visit | Attending: Anesthesiology | Admitting: Anesthesiology

## 2015-02-24 LAB — SURGICAL PCR SCREEN
MRSA, PCR: NEGATIVE
Staphylococcus aureus: POSITIVE — AB

## 2015-02-24 LAB — POTASSIUM: POTASSIUM: 3.7 mmol/L (ref 3.5–5.1)

## 2015-02-24 NOTE — Patient Instructions (Signed)
  Your procedure is scheduled on: 02-25-15 Report to [facility] To find out your arrival time please call [phone] between 1PM - 3PM on 02-24-15 [facility])  Remember: Instructions that are not followed completely may result in serious medical risk, up to and including death, or upon the discretion of your surgeon and anesthesiologist your surgery may need to be rescheduled.    _X___ 1. Do not eat food or drink liquids after midnight. No gum chewing or hard candies.     _X___ 2. No Alcohol for 24 hours before or after surgery.   ____ 3. Bring all medications with you on the day of surgery if instructed.    ____ 4. Notify your doctor if there is any change in your medical condition     (cold, fever, infections).     Do not wear jewelry, make-up, hairpins, clips or nail polish.  Do not wear lotions, powders, or perfumes. You may wear deodorant.  Do not shave 48 hours prior to surgery. Men may shave face and neck.  Do not bring valuables to the hospital.    [facility] is not responsible for any belongings or valuables.               Contacts, dentures or bridgework may not be worn into surgery.  Leave your suitcase in the car. After surgery it may be brought to your room.  For patients admitted to the hospital, discharge time is determined by your treatment team.   Patients discharged the day of surgery will not be allowed to drive home.   Please read over the following fact sheets that you were given:     __X__ Take these medicines the morning of surgery with A SIP OF WATER:    1. BUSPIRONE  2. OMEPRAZOLE  3. TAKE AN EXTRA OMEPRAZOLE Monday NIGHT  4.  5.  6.  ____ Fleet Enema (as directed)   ____ Use CHG Soap as directed  __X__ Use inhalers on the day of surgery-USE PROAIR INHALER AT [place]  ____ Stop metformin 2 days prior to surgery    ____ Take 1/2 of usual insulin dose the night before surgery and none on the morning  of surgery.   _X___ Stop Coumadin/Plavix/aspirin-STOP BUTALBITOL NOW  ____ Stop Anti-inflammatories-NO NSAIDS OR ASA PRODUCTS-TYLENOL OK   ____ Stop supplements until after surgery.    ____ Bring C-Pap to the hospital.

## 2015-02-24 NOTE — Pre-Procedure Instructions (Signed)
NOTIFIED MICHELE IN DR [facility] OFFICE THAT PT WAS POSITIVE FOR STAPH BUT NEGATIVE FOR MRSA ( MRSA PCR SWAB PER PROTOCOL FOR COLON SURGERY). RESULTS FAXED TO DR BWIOMB'T OFFICE AND INFORMED MICHELE THAT IT WAS UP TO DR Jamal Collin WHETHER OR NOT HE WANTS TO START MUPIROCIN FOR POSITVE STAPH RESULT. CHG Tucker WAS GIVEN TO PT AND SHE IS TO SHOWER WITH THIS TONIGHT AND IN THE MORNING BEFORE SURGERY.

## 2015-02-24 NOTE — Pre-Procedure Instructions (Signed)
MICHELE CALLED BACK AND SAID THAT DR Jamal Collin IS NOT GOING TO ORDER MUPIROCIN FOR + STAPH RESULT-HE STATED THAT INVANZ WILL BE ADEQUATE FOR THE POSITIVE STAPH RESULT

## 2015-02-25 ENCOUNTER — Encounter
Admission: RE | Disposition: A | Discharge status: Home / Self Care | Payer: Self-pay | Source: Ambulatory Visit | Attending: General Surgery

## 2015-02-25 ENCOUNTER — Encounter: Payer: Self-pay | Admitting: *Deleted

## 2015-02-25 ENCOUNTER — Inpatient Hospital Stay
Admission: RE | Admit: 2015-02-25 | Discharge: 2015-03-01 | DRG: 330 | Disposition: A | Discharge status: Home / Self Care | Payer: Medicaid Other | Source: Ambulatory Visit | Attending: General Surgery | Admitting: General Surgery

## 2015-02-25 ENCOUNTER — Inpatient Hospital Stay: Payer: Medicaid Other | Admitting: Anesthesiology

## 2015-02-25 DIAGNOSIS — K219 Gastro-esophageal reflux disease without esophagitis: Secondary | ICD-10-CM | POA: Diagnosis present

## 2015-02-25 DIAGNOSIS — Z79899 Other long term (current) drug therapy: Secondary | ICD-10-CM | POA: Diagnosis not present

## 2015-02-25 DIAGNOSIS — C180 Malignant neoplasm of cecum: Secondary | ICD-10-CM | POA: Diagnosis present

## 2015-02-25 DIAGNOSIS — K561 Intussusception: Secondary | ICD-10-CM | POA: Diagnosis present

## 2015-02-25 DIAGNOSIS — D649 Anemia, unspecified: Secondary | ICD-10-CM | POA: Diagnosis present

## 2015-02-25 DIAGNOSIS — D120 Benign neoplasm of cecum: Secondary | ICD-10-CM | POA: Diagnosis present

## 2015-02-25 DIAGNOSIS — K660 Peritoneal adhesions (postprocedural) (postinfection): Secondary | ICD-10-CM | POA: Diagnosis present

## 2015-02-25 DIAGNOSIS — J45909 Unspecified asthma, uncomplicated: Secondary | ICD-10-CM | POA: Diagnosis present

## 2015-02-25 DIAGNOSIS — K635 Polyp of colon: Secondary | ICD-10-CM

## 2015-02-25 DIAGNOSIS — F419 Anxiety disorder, unspecified: Secondary | ICD-10-CM | POA: Diagnosis present

## 2015-02-25 DIAGNOSIS — I10 Essential (primary) hypertension: Secondary | ICD-10-CM | POA: Diagnosis present

## 2015-02-25 DIAGNOSIS — Z87891 Personal history of nicotine dependence: Secondary | ICD-10-CM

## 2015-02-25 HISTORY — DX: Headache: R51

## 2015-02-25 HISTORY — PX: LAPAROSCOPIC RIGHT HEMI COLECTOMY: SHX5926

## 2015-02-25 HISTORY — DX: Headache, unspecified: R51.9

## 2015-02-25 LAB — POCT PREGNANCY, URINE: PREG TEST UR: NEGATIVE

## 2015-02-25 LAB — CBC
HCT: 33.6 % — ABNORMAL LOW (ref 35.0–47.0)
HEMOGLOBIN: 11.1 g/dL — AB (ref 12.0–16.0)
MCH: 28.4 pg (ref 26.0–34.0)
MCHC: 33 g/dL (ref 32.0–36.0)
MCV: 86.2 fL (ref 80.0–100.0)
Platelets: 188 10*3/uL (ref 150–440)
RBC: 3.9 MIL/uL (ref 3.80–5.20)
RDW: 18.7 % — ABNORMAL HIGH (ref 11.5–14.5)
WBC: 10.5 10*3/uL (ref 3.6–11.0)

## 2015-02-25 LAB — CREATININE, SERUM
Creatinine, Ser: 0.88 mg/dL (ref 0.44–1.00)
GFR calc Af Amer: >60 mL/min (ref >60)
GFR calc non Af Amer: >60 mL/min (ref >60)

## 2015-02-25 SURGERY — LAPAROSCOPIC RIGHT HEMI COLECTOMY
Anesthesia: General | Laterality: Right | Wound class: Clean Contaminated

## 2015-02-25 MED ORDER — ZOLPIDEM TARTRATE 5 MG PO TABS: 5.0000 mg | ORAL_TABLET | Freq: Every evening | ORAL | Status: DC | PRN

## 2015-02-25 MED ORDER — DEXAMETHASONE SODIUM PHOSPHATE 4 MG/ML IJ SOLN
INTRAMUSCULAR | Status: DC | PRN
  Administered 2015-02-25: 5 mg via INTRAVENOUS

## 2015-02-25 MED ORDER — LACTATED RINGERS IV SOLN
INTRAVENOUS | Status: DC
Start: 2015-02-25 — End: 2015-02-25
  Administered 2015-02-25 (×4): via INTRAVENOUS

## 2015-02-25 MED ORDER — CHLORHEXIDINE GLUCONATE 4 % EX LIQD: 1.0000 "application " | Freq: Once | CUTANEOUS | Status: DC

## 2015-02-25 MED ORDER — ALBUTEROL SULFATE (2.5 MG/3ML) 0.083% IN NEBU: 2.5000 mg | INHALATION_SOLUTION | RESPIRATORY_TRACT | Status: DC | PRN

## 2015-02-25 MED ORDER — INFLUENZA VAC SPLIT QUAD 0.5 ML IM SUSY: 0.5000 mL | PREFILLED_SYRINGE | INTRAMUSCULAR | Status: DC

## 2015-02-25 MED ORDER — LABETALOL HCL 5 MG/ML IV SOLN
INTRAVENOUS | Status: DC | PRN
  Administered 2015-02-25 (×2): 10 mg via INTRAVENOUS

## 2015-02-25 MED ORDER — FENTANYL CITRATE (PF) 100 MCG/2ML IJ SOLN
INTRAMUSCULAR | Status: DC | PRN
  Administered 2015-02-25 (×2): 250 ug via INTRAVENOUS

## 2015-02-25 MED ORDER — FERROUS SULFATE 325 (65 FE) MG PO TABS
325.0000 mg | ORAL_TABLET | Freq: Every day | ORAL | Status: DC
  Administered 2015-02-25 – 2015-02-28 (×3): 325 mg via ORAL
  Filled 2015-02-25 (×5): qty 1

## 2015-02-25 MED ORDER — IRON PO TABS: 1.0000 | ORAL_TABLET | Freq: Every day | ORAL | Status: DC

## 2015-02-25 MED ORDER — GLYCOPYRROLATE 0.2 MG/ML IJ SOLN
INTRAMUSCULAR | Status: DC | PRN
  Administered 2015-02-25: .5 mg via INTRAVENOUS

## 2015-02-25 MED ORDER — BUTALBITAL-ASA-CAFF-CODEINE 50-325-40-30 MG PO CAPS: 1.0000 | ORAL_CAPSULE | Freq: Three times a day (TID) | ORAL | Status: DC | PRN

## 2015-02-25 MED ORDER — LIDOCAINE HCL (CARDIAC) 20 MG/ML IV SOLN
INTRAVENOUS | Status: DC | PRN
  Administered 2015-02-25: 100 mg via INTRAVENOUS

## 2015-02-25 MED ORDER — ONDANSETRON HCL 4 MG/2ML IJ SOLN: 4.0000 mg | Freq: Once | INTRAMUSCULAR | Status: DC | PRN

## 2015-02-25 MED ORDER — FENTANYL CITRATE (PF) 100 MCG/2ML IJ SOLN
INTRAMUSCULAR | Status: AC
  Administered 2015-02-25: 25 ug via INTRAVENOUS
  Filled 2015-02-25: qty 2

## 2015-02-25 MED ORDER — ESMOLOL HCL 10 MG/ML IV SOLN
INTRAVENOUS | Status: DC | PRN
  Administered 2015-02-25: 20 mg via INTRAVENOUS

## 2015-02-25 MED ORDER — BUSPIRONE HCL 10 MG PO TABS
10.0000 mg | ORAL_TABLET | Freq: Two times a day (BID) | ORAL | Status: DC
  Administered 2015-02-25 – 2015-02-28 (×5): 10 mg via ORAL
  Filled 2015-02-25 (×6): qty 1

## 2015-02-25 MED ORDER — PROPOFOL 10 MG/ML IV BOLUS
INTRAVENOUS | Status: DC | PRN
  Administered 2015-02-25: 180 mg via INTRAVENOUS

## 2015-02-25 MED ORDER — ROCURONIUM BROMIDE 100 MG/10ML IV SOLN
INTRAVENOUS | Status: DC | PRN
  Administered 2015-02-25: 50 mg via INTRAVENOUS
  Administered 2015-02-25: 15 mg via INTRAVENOUS
  Administered 2015-02-25: 20 mg via INTRAVENOUS

## 2015-02-25 MED ORDER — OXYCODONE HCL 5 MG PO TABS
5.0000 mg | ORAL_TABLET | ORAL | Status: DC | PRN
  Administered 2015-02-25: 10 mg via ORAL
  Administered 2015-02-25: 5 mg via ORAL
  Administered 2015-02-26 (×2): 10 mg via ORAL
  Administered 2015-02-27 (×2): 5 mg via ORAL
  Administered 2015-02-27 – 2015-03-01 (×3): 10 mg via ORAL
  Filled 2015-02-25 (×2): qty 2
  Filled 2015-02-25 (×2): qty 1
  Filled 2015-02-25 (×4): qty 2
  Filled 2015-02-25: qty 1
  Filled 2015-02-25: qty 2

## 2015-02-25 MED ORDER — ALVIMOPAN 12 MG PO CAPS
12.0000 mg | ORAL_CAPSULE | Freq: Two times a day (BID) | ORAL | Status: DC
  Administered 2015-02-26 – 2015-02-27 (×4): 12 mg via ORAL
  Filled 2015-02-25 (×4): qty 1

## 2015-02-25 MED ORDER — CLOTRIMAZOLE 1 % EX CREA
TOPICAL_CREAM | Freq: Two times a day (BID) | CUTANEOUS | Status: DC
  Administered 2015-02-25 – 2015-02-28 (×7): via TOPICAL
  Filled 2015-02-25: qty 15

## 2015-02-25 MED ORDER — DEXTROSE-NACL 5-0.45 % IV SOLN
INTRAVENOUS | Status: DC
  Administered 2015-02-25 – 2015-02-26 (×3): via INTRAVENOUS

## 2015-02-25 MED ORDER — BUTALBITAL-APAP-CAFFEINE 50-325-40 MG PO TABS: 1.0000 | ORAL_TABLET | Freq: Three times a day (TID) | ORAL | Status: DC | PRN

## 2015-02-25 MED ORDER — MORPHINE SULFATE (PF) 2 MG/ML IV SOLN
2.0000 mg | INTRAVENOUS | Status: DC | PRN
  Administered 2015-02-25: 2 mg via INTRAVENOUS
  Filled 2015-02-25: qty 1

## 2015-02-25 MED ORDER — ACETAMINOPHEN 10 MG/ML IV SOLN
INTRAVENOUS | Status: DC | PRN
Start: 2015-02-25 — End: 2015-02-25
  Administered 2015-02-25: 1000 mg via INTRAVENOUS

## 2015-02-25 MED ORDER — SODIUM CHLORIDE 0.9 % IV SOLN
1.0000 g | INTRAVENOUS | Status: AC
  Administered 2015-02-25: 1 g via INTRAVENOUS
  Filled 2015-02-25: qty 1

## 2015-02-25 MED ORDER — PANTOPRAZOLE SODIUM 40 MG PO TBEC
40.0000 mg | DELAYED_RELEASE_TABLET | Freq: Every day | ORAL | Status: DC
  Administered 2015-02-27 – 2015-02-28 (×2): 40 mg via ORAL
  Filled 2015-02-25 (×2): qty 1

## 2015-02-25 MED ORDER — ALBUTEROL SULFATE HFA 108 (90 BASE) MCG/ACT IN AERS: 1.0000 | INHALATION_SPRAY | RESPIRATORY_TRACT | Status: DC | PRN

## 2015-02-25 MED ORDER — FLUTICASONE PROPIONATE 50 MCG/ACT NA SUSP
1.0000 | NASAL | Status: DC | PRN
  Filled 2015-02-25: qty 16

## 2015-02-25 MED ORDER — ENOXAPARIN SODIUM 40 MG/0.4ML ~~LOC~~ SOLN
40.0000 mg | SUBCUTANEOUS | Status: DC
  Administered 2015-02-25 – 2015-02-28 (×4): 40 mg via SUBCUTANEOUS
  Filled 2015-02-25 (×4): qty 0.4

## 2015-02-25 MED ORDER — PHENYLEPHRINE HCL 10 MG/ML IJ SOLN
INTRAMUSCULAR | Status: DC | PRN
  Administered 2015-02-25: 200 ug via INTRAVENOUS
  Administered 2015-02-25 (×2): 100 ug via INTRAVENOUS
  Administered 2015-02-25: 200 ug via INTRAVENOUS

## 2015-02-25 MED ORDER — ONDANSETRON HCL 4 MG/2ML IJ SOLN
INTRAMUSCULAR | Status: DC | PRN
  Administered 2015-02-25: 4 mg via INTRAVENOUS

## 2015-02-25 MED ORDER — ALVIMOPAN 12 MG PO CAPS
12.0000 mg | ORAL_CAPSULE | Freq: Once | ORAL | Status: AC
  Administered 2015-02-25: 12 mg via ORAL

## 2015-02-25 MED ORDER — FENTANYL CITRATE (PF) 100 MCG/2ML IJ SOLN
25.0000 ug | INTRAMUSCULAR | Status: DC | PRN
  Administered 2015-02-25 (×4): 25 ug via INTRAVENOUS

## 2015-02-25 MED ORDER — ACETAMINOPHEN 325 MG PO TABS
650.0000 mg | ORAL_TABLET | Freq: Four times a day (QID) | ORAL | Status: DC | PRN
  Administered 2015-02-27: 650 mg via ORAL
  Filled 2015-02-25: qty 2

## 2015-02-25 MED ORDER — ACETAMINOPHEN 10 MG/ML IV SOLN
INTRAVENOUS | Status: AC
  Filled 2015-02-25: qty 100

## 2015-02-25 MED ORDER — EPINEPHRINE HCL 1 MG/ML IJ SOLN
INTRAMUSCULAR | Status: DC | PRN
  Administered 2015-02-25 (×2): .01 mg via INTRAVENOUS

## 2015-02-25 MED ORDER — ACETAMINOPHEN 650 MG RE SUPP: 650.0000 mg | Freq: Four times a day (QID) | RECTAL | Status: DC | PRN

## 2015-02-25 MED ORDER — ALVIMOPAN 12 MG PO CAPS
ORAL_CAPSULE | ORAL | Status: AC
  Filled 2015-02-25: qty 1

## 2015-02-25 MED ORDER — NEOSTIGMINE METHYLSULFATE 10 MG/10ML IV SOLN
INTRAVENOUS | Status: DC | PRN
  Administered 2015-02-25: 3 mg via INTRAVENOUS

## 2015-02-25 SURGICAL SUPPLY — 77 items
APPLIER CLIP ROT 10 11.4 M/L (STAPLE)
BLADE SURG 10 STRL SS SAFETY (BLADE) ×3 IMPLANT
BLADE SURG 11 STRL SS SAFETY (MISCELLANEOUS) ×3 IMPLANT
CANISTER SUCT 1200ML W/VALVE (MISCELLANEOUS) ×3 IMPLANT
CANNULA DILATOR 12 W/SLV (CANNULA) IMPLANT
CANNULA DILATOR 12MM W/SLV (CANNULA)
CATH FOL LEG HOLDER (MISCELLANEOUS) ×3 IMPLANT
CATH TRAY 16F METER LATEX (MISCELLANEOUS) ×3 IMPLANT
CHLORAPREP W/TINT 26ML (MISCELLANEOUS) ×3 IMPLANT
CLEANER CAUTERY TIP 5X5 PAD (MISCELLANEOUS) IMPLANT
CLIP APPLIE ROT 10 11.4 M/L (STAPLE) IMPLANT
CLOSURE WOUND 1/2 X4 (GAUZE/BANDAGES/DRESSINGS)
COVER CLAMP SIL LG PBX B (MISCELLANEOUS) IMPLANT
DEFOGGER SCOPE WARMER CLEARIFY (MISCELLANEOUS) ×3 IMPLANT
DEVICE HAND ACCESS DEXTUS (MISCELLANEOUS) IMPLANT
DRAPE INCISE IOBAN 66X45 STRL (DRAPES) ×3 IMPLANT
DRAPE LEGGINS SURG 28X43 STRL (DRAPES) IMPLANT
DRAPE UNDER BUTTOCK W/FLU (DRAPES) IMPLANT
DRSG OPSITE POSTOP 4X10 (GAUZE/BANDAGES/DRESSINGS) IMPLANT
DRSG OPSITE POSTOP 4X8 (GAUZE/BANDAGES/DRESSINGS) ×3 IMPLANT
DRSG TEGADERM 2-3/8X2-3/4 SM (GAUZE/BANDAGES/DRESSINGS) ×3 IMPLANT
DRSG TEGADERM 4X4.75 (GAUZE/BANDAGES/DRESSINGS) IMPLANT
DRSG TELFA 3X8 NADH (GAUZE/BANDAGES/DRESSINGS) ×3 IMPLANT
ELECT BLADE 6.5 EXT (BLADE) ×3 IMPLANT
FILTER LAP SMOKE EVAC STRL (MISCELLANEOUS) IMPLANT
GLOVE BIO SURGEON STRL SZ7 (GLOVE) ×36 IMPLANT
GOWN STRL REUS W/ TWL LRG LVL3 (GOWN DISPOSABLE) ×8 IMPLANT
GOWN STRL REUS W/TWL LRG LVL3 (GOWN DISPOSABLE) ×16
HANDLE YANKAUER SUCT BULB TIP (MISCELLANEOUS) IMPLANT
IRRIGATION STRYKERFLOW (MISCELLANEOUS) ×1 IMPLANT
IRRIGATOR STRYKERFLOW (MISCELLANEOUS) ×3
IV LACTATED RINGERS 1000ML (IV SOLUTION) ×3 IMPLANT
KIT RM TURNOVER STRD PROC AR (KITS) ×3 IMPLANT
LABEL OR SOLS (LABEL) IMPLANT
NDL INSUFF ACCESS 14 VERSASTEP (NEEDLE) ×3 IMPLANT
NS IRRIG 500ML POUR BTL (IV SOLUTION) ×3 IMPLANT
PACK COLON CLEAN CLOSURE (MISCELLANEOUS) ×3 IMPLANT
PACK LAP CHOLECYSTECTOMY (MISCELLANEOUS) ×3 IMPLANT
PAD CLEANER CAUTERY TIP 5X5 (MISCELLANEOUS)
PAD GROUND ADULT SPLIT (MISCELLANEOUS) ×3 IMPLANT
PAD PREP 24X41 OB/GYN DISP (PERSONAL CARE ITEMS) IMPLANT
PAD TRENDELENBURG OR TABLE (MISCELLANEOUS) ×3 IMPLANT
PENCIL ELECTRO HAND CTR (MISCELLANEOUS) ×3 IMPLANT
PROT DEXTUS HAND ACCESS (MISCELLANEOUS)
RELOAD PROXIMATE 75MM BLUE (ENDOMECHANICALS) ×3 IMPLANT
RETRACTOR FIXED LENGTH SML (MISCELLANEOUS) IMPLANT
RETRACTOR WOUND ALXS 18CM MED (MISCELLANEOUS) ×1 IMPLANT
RTRCTR WOUND ALEXIS O 18CM MED (MISCELLANEOUS) ×3
SCISSORS METZENBAUM CVD 33 (INSTRUMENTS) IMPLANT
SET YANKAUER POOLE SUCT (MISCELLANEOUS) ×3 IMPLANT
SHEARS HARMONIC ACE PLUS 36CM (ENDOMECHANICALS) ×3 IMPLANT
SLEEVE ENDOPATH XCEL 5M (ENDOMECHANICALS) IMPLANT
SOL ANTI-FOG 6CC FOG-OUT (MISCELLANEOUS) ×1 IMPLANT
SOL FOG-OUT ANTI-FOG 6CC (MISCELLANEOUS) ×2
SPONGE LAP 18X18 5 PK (GAUZE/BANDAGES/DRESSINGS) ×6 IMPLANT
STAPLER PROXIMATE 75MM BLUE (STAPLE) ×3 IMPLANT
STAPLER SKIN PROX 35W (STAPLE) ×3 IMPLANT
STRIP CLOSURE SKIN 1/2X4 (GAUZE/BANDAGES/DRESSINGS) IMPLANT
SUT MNCRL AB 3-0 PS2 27 (SUTURE) ×3 IMPLANT
SUT PROLENE 0 CT 1 30 (SUTURE) ×12 IMPLANT
SUT SILK 2 0 (SUTURE) ×2
SUT SILK 2-0 18XBRD TIE 12 (SUTURE) ×1 IMPLANT
SUT SILK 3-0 (SUTURE) ×3 IMPLANT
SUT VIC AB 0 CT1 36 (SUTURE) IMPLANT
SUT VIC AB 2-0 BRD 54 (SUTURE) IMPLANT
SUT VIC AB 2-0 CT1 27 (SUTURE) ×2
SUT VIC AB 2-0 CT1 TAPERPNT 27 (SUTURE) ×1 IMPLANT
SUT VIC AB 3-0 54X BRD REEL (SUTURE) ×2 IMPLANT
SUT VIC AB 3-0 BRD 54 (SUTURE) ×4
SUT VIC AB 3-0 SH 27 (SUTURE) ×2
SUT VIC AB 3-0 SH 27X BRD (SUTURE) ×1 IMPLANT
SUT VIC AB 4-0 FS2 27 (SUTURE) IMPLANT
SYR BULB IRRIG 60ML STRL (SYRINGE) ×3 IMPLANT
TROCAR XCEL NON-BLD 11X100MML (ENDOMECHANICALS) ×3 IMPLANT
TROCAR XCEL NON-BLD 5MMX100MML (ENDOMECHANICALS) ×3 IMPLANT
TROCAR XCEL UNIV SLVE 11M 100M (ENDOMECHANICALS) ×3 IMPLANT
TUBING INSUFFLATOR HEATED (MISCELLANEOUS) ×3 IMPLANT

## 2015-02-25 NOTE — Anesthesia Preprocedure Evaluation (Signed)
Anesthesia Evaluation  Patient identified by MRN, date of birth, ID band Patient awake    Reviewed: Allergy & Precautions, NPO status , Patient's Chart, lab work & pertinent test results  History of Anesthesia Complications Negative for: history of anesthetic complications  Airway Mallampati: II  TM Distance: >3 FB Neck ROM: Full    Dental  (+) Teeth Intact   Pulmonary asthma (last inhaler 1 week ago) , former smoker,           Cardiovascular negative cardio ROS       Neuro/Psych Anxiety    GI/Hepatic GERD  Medicated and Controlled,  Endo/Other  negative endocrine ROS  Renal/GU negative Renal ROS     Musculoskeletal   Abdominal   Peds  Hematology  (+) anemia ,   Anesthesia Other Findings   Reproductive/Obstetrics                             Anesthesia Physical Anesthesia Plan  ASA: II  Anesthesia Plan: General   Post-op Pain Management:    Induction: Intravenous  Airway Management Planned:   Additional Equipment:   Intra-op Plan:   Post-operative Plan:   Informed Consent: I have reviewed the patients History and Physical, chart, labs and discussed the procedure including the risks, benefits and alternatives for the proposed anesthesia with the patient or authorized representative who has indicated his/her understanding and acceptance.     Plan Discussed with:   Anesthesia Plan Comments:         Anesthesia Quick Evaluation

## 2015-02-25 NOTE — Anesthesia Postprocedure Evaluation (Signed)
  Anesthesia Post-op Note  Patient: Briana Snyder  Procedure(s) Performed: Procedure(s): LAPAROSCOPIC RIGHT HEMI COLECTOMY (Right)  Anesthesia type:General  Patient location: PACU  Post pain: Pain level controlled  Post assessment: Post-op Vital signs reviewed, Patient's Cardiovascular Status Stable, Respiratory Function Stable, Patent Airway and No signs of Nausea or vomiting  Post vital signs: Reviewed and stable  Last Vitals:  Filed Vitals:   02/25/15 1219  BP: 112/78  Pulse: 76  Temp: 36.4 C  Resp: 16    Level of consciousness: awake, alert  and patient cooperative  Complications: No apparent anesthesia complications

## 2015-02-25 NOTE — H&P (View-Only) (Signed)
Patient ID: Briana Snyder, female   DOB: 03/29/1966, 49 y.o.   MRN: [id number]  Chief Complaint  Patient presents with  . Follow-up    HPI Briana Snyder is a 49 y.o. female.  Here today for postoperative visit, colonoscopy on 01-28-15 for colonic mass. She states she is doing well. 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   HPI  Past Medical History  Diagnosis Date  . Asthma   . Anxiety   . GERD (gastroesophageal reflux disease)   . Anemia     Past Surgical History  Procedure Laterality Date  . Thyroidectomy  1987    Right  . Endometrial polyps  08/2010    resected  . Hernia repair  2000    inguinal   . Breast biopsy Left 08/2010    benign  . Breast lumpectomy Left 2012  . Colonoscopy N/A 01/28/2015    Procedure: COLONOSCOPY;  Surgeon: Christene Lye, MD;  Location: [facility] ENDOSCOPY;  Service: Endoscopy;  Laterality: N/A;    Family History  Problem Relation Age of Onset  . Breast cancer Mother 46  . Prostate cancer Father     Social History Social History  Substance Use Topics  . Smoking status: Former Smoker -- 0.50 packs/day for 18 years    Types: Cigarettes    Quit date: 05/25/2011  . Smokeless tobacco: Never Used  . Alcohol Use: 0.0 oz/week    0 Standard drinks or equivalent per week    No Known Allergies  Current Outpatient Prescriptions  Medication Sig Dispense Refill  . Acetaminophen-Codeine (TYLENOL/CODEINE #3) 300-30 MG per tablet Take 1 tablet by mouth 2 (two) times daily as needed for pain (headaches).    . busPIRone (BUSPAR) 10 MG tablet Take 1 tablet by mouth 2 (two) times daily.  0  . butalbital-aspirin-caffeine-codeine (BUTALBITAL COMPOUND/CODEINE) 50-325-40-30 MG capsule Take 1 capsule by mouth 3 (three) times daily as needed for pain.    . clotrimazole-betamethasone (LOTRISONE) cream apply externally to affected area twice a day for 2 weeks  0  . fluticasone (FLONASE) 50 MCG/ACT nasal spray Place 1 spray  into both nostrils daily.    Marland Kitchen omeprazole (PRILOSEC) 40 MG capsule Take 40 mg by mouth every evening.  0  . PROAIR HFA 108 (90 BASE) MCG/ACT inhaler Inhale 1 puff into the lungs every 4 (four) hours as needed.  0   No current facility-administered medications for this visit.    Review of Systems Review of Systems  Constitutional: Negative.   Respiratory: Negative.   Cardiovascular: Negative.     Blood pressure 132/82, pulse 88, resp. rate 16, height 5' 6.5" (1.689 m), weight 183 lb (83.008 kg).  Physical Exam Physical Exam  Constitutional: She is oriented to person, place, and time. She appears well-developed and well-nourished.  HENT:  Mouth/Throat: Oropharynx is clear and moist.  Eyes: Conjunctivae are normal. No scleral icterus.  Neck: Neck supple.  Cardiovascular: Normal rate, regular rhythm and normal heart sounds.   No lower leg edema.  Pulmonary/Chest: Effort normal and breath sounds normal.  Abdominal: Soft. Bowel sounds are normal. There is no hepatomegaly. There is no tenderness.  Lymphadenopathy:    She has no cervical adenopathy.  Neurological: She is alert and oriented to person, place, and time.  Skin: Skin is warm and dry.  Psychiatric: Her behavior is normal.    Data Reviewed CT scan and colonoscopy.  Assessment    Cecal polyp with dysplasia and intussusception  Plan   Recommended laparoscopy, right colectomy.  Discussed risk and benefits of surgery in detail. Pt is agreeable.  Patient is scheduled for surgery at [facility] on 02/25/15. She will do her bowel prep on 02/24/15 as instructed. She is aware to call the day before surgery for her arrival time. prescriptions for Miralax, Flagyl, and Neomycin have been sent into her pharmacy. Patient is aware of date and instructions.   Cc: PCP  SANKAR,SEEPLAPUTHUR G 02/04/2015, 9:54 AM

## 2015-02-25 NOTE — Interval H&P Note (Signed)
History and Physical Interval Note:  02/25/2015 7:07 AM  Briana Snyder  has presented today for surgery, with the diagnosis of CECAL POLYP  The various methods of treatment have been discussed with the patient and family. After consideration of risks, benefits and other options for treatment, the patient has consented to  Procedure(s): LAPAROSCOPIC RIGHT HEMI COLECTOMY (Right) as a surgical intervention .  The patient's history has been reviewed, patient examined, no change in status, stable for surgery.  I have reviewed the patient's chart and labs.  Questions were answered to the patient's satisfaction.     Gustave Lindeman G

## 2015-02-25 NOTE — Anesthesia Procedure Notes (Signed)
Procedure Name: Intubation Date/Time: 02/25/2015 7:29 AM Performed by: Rosaria Ferries, Pius Byrom Pre-anesthesia Checklist: Patient identified, Emergency Drugs available, Suction available and Patient being monitored Patient Re-evaluated:Patient Re-evaluated prior to inductionOxygen Delivery Method: Circle system utilized Preoxygenation: Pre-oxygenation with 100% oxygen Intubation Type: IV induction Laryngoscope Size: Mac and 3 Grade View: Grade I Tube type: Oral Tube size: 7.0 mm Number of attempts: 1 Placement Confirmation: ETT inserted through vocal cords under direct vision,  positive ETCO2 and breath sounds checked- equal and bilateral Secured at: 21 cm Tube secured with: Tape Dental Injury: Teeth and Oropharynx as per pre-operative assessment

## 2015-02-25 NOTE — Transfer of Care (Signed)
Immediate Anesthesia Transfer of Care Note  Patient: Briana Snyder  Procedure(s) Performed: Procedure(s): LAPAROSCOPIC RIGHT HEMI COLECTOMY (Right)  Patient Location: PACU  Anesthesia Type:General  Level of Consciousness: awake, alert , oriented and patient cooperative  Airway & Oxygen Therapy: Patient Spontanous Breathing and Patient connected to nasal cannula oxygen  Post-op Assessment: Report given to RN and Post -op Vital signs reviewed and stable  Post vital signs: Reviewed and stable  Last Vitals:  Filed Vitals:   02/25/15 1025  BP: 127/99  Pulse: 79  Temp: 36.1 C  Resp: 14    Complications: No apparent anesthesia complications

## 2015-02-25 NOTE — Progress Notes (Signed)
   02/25/15 1522  Clinical Encounter Type  Visited With Patient  Visit Type Initial;Spiritual support  Referral From Nurse  Consult/Referral To Chaplain  Spiritual Encounters  Spiritual Needs Prayer  Stress Factors  Patient Stress Factors None identified  Order rec'd. Requesting prayer. Visited with Pt. And had prayer. Pt was in some discomfort (post-op) but thanked for the prayer. Chap. Evern Bio [phone]

## 2015-02-25 NOTE — Op Note (Signed)
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  Complications: None  EBL: 200 mL  Drains: None  Description: Patient was put to sleep in supine position the operating table. Foley catheter was inserted and the abdomen prepped and draped out as sterile field. Initial port incision was made and a small vertical incision just above the umbilicus and this was just below the small vertical midline incision where she had a previous hernia repair done. Veress needle with the InnerDyne sleeve was position through the peritoneum into the peritoneal cavity and verified of the hanging drop method and subsequent pneumoperitoneum obtained. 10 mm port was placed at the site. The camera in place there was noted to be adhesions of the omentum tenting up the colon in the epigastric region from her previous hernia repair and this precluded adequate visible visualization of the proximal portion of the transverse colon. The left upper quadrant 11 mm port and is epigastric 5 mm ports were placed. The cecum and terminal ileum was identified and was noted that there was tethered to the lateral peritoneum and with careful exposure this was taken down with the use of the harmonic device. There was bulkiness and heaviness in the cecum consistent with the known polyp at the site. Terminal ileum appeared normal. Mobilization of right colon was then performed and along the lateral gutter until the hepatic flexure area was reached. Again using the harmonic scalpel the hepatic flexure was freed. The right colon was noted be fairly mobile at this time being able to come towards the midline and a little bit beyond. The portion of the transverse colon that was tented and covered with the adhesions however could not be adequately freed. Therefore it was decided to  proceed with the minilaparotomy incision. 6 cm incision was then made along the upper midline from the umbilicus up including the initial port site and deepened through the layers of abdominal wall in the superior aspect of the previous mesh was encountered which was cut with the scissors along the midline area. The omental adhesions were then freed the transverse colon was adequately identified and freed. Lines of resection were mapped out in the terminal ileum and the proximal transverse colon. The mesentery of these 2 sites was freed and then taken down with the use of a Harmonic scalpel down to the main right colic vessel. The right colic vessel was clamped cut and ligated with the patient side being doubly ligated with 06 2-0 silk. A side-to-side anastomosis of the terminal ileum to the transverse colon was then made with the use of a GIA stapling device and the remaining opening was closed along with transection of the bowel using a second load of the GIA. The mesenteric opening was closed with 3-0 Vicryl and the corners along the staple line were reinforced with 3-0 silk. At this point the gloves and gown were changed the area around the incision was re-prepped and draped in closure was obtained the wound abdominal cavity was irrigated with saline and all fluid suctioned out. The fascia was closed with the interrupted stitches of 0 proline figure-of-eight. Subcutaneous tissue was irrigated and closed with 3-0 Vicryl. The skin portion going through the umbilicus was reapproximated with a subcuticular 3-0 Monocryl. The rest of the skin incision above this closed with staples as also the 2 small port sites.  Dry sterile dressings were placed, patient tolerated the procedure well. She was subsequently returned recovery room after extubation in stable condition. The transected right colon was opened to reveal the presence of a very large polyp with a very firm broad base located behind the ileocecal opening as was  evidenced on colonoscopy.

## 2015-02-26 LAB — BASIC METABOLIC PANEL
Anion gap: 7 (ref 5–15)
BUN: <5 mg/dL — ABNORMAL LOW (ref 6–20)
CHLORIDE: 103 mmol/L (ref 101–111)
CO2: 28 mmol/L (ref 22–32)
Calcium: 8.2 mg/dL — ABNORMAL LOW (ref 8.9–10.3)
Creatinine, Ser: 0.84 mg/dL (ref 0.44–1.00)
GFR calc non Af Amer: >60 mL/min (ref >60)
Glucose, Bld: 114 mg/dL — ABNORMAL HIGH (ref 65–99)
POTASSIUM: 3.3 mmol/L — AB (ref 3.5–5.1)
SODIUM: 138 mmol/L (ref 135–145)

## 2015-02-26 LAB — CBC
HEMATOCRIT: 33.5 % — AB (ref 35.0–47.0)
Hemoglobin: 11 g/dL — ABNORMAL LOW (ref 12.0–16.0)
MCH: 28 pg (ref 26.0–34.0)
MCHC: 32.7 g/dL (ref 32.0–36.0)
MCV: 85.5 fL (ref 80.0–100.0)
Platelets: 192 10*3/uL (ref 150–440)
RBC: 3.92 MIL/uL (ref 3.80–5.20)
RDW: 18.5 % — ABNORMAL HIGH (ref 11.5–14.5)
WBC: 6.8 10*3/uL (ref 3.6–11.0)

## 2015-02-26 MED ORDER — KCL IN DEXTROSE-NACL 20-5-0.45 MEQ/L-%-% IV SOLN
INTRAVENOUS | Status: DC
  Administered 2015-02-26 – 2015-02-27 (×3): via INTRAVENOUS
  Filled 2015-02-26 (×5): qty 1000

## 2015-02-26 NOTE — Progress Notes (Signed)
Patient ID: Briana Snyder, female   DOB: 06/20/1965, 49 y.o.   MRN: [id number] No complaints. No n/v, pain control good. AVSS.Abdomen is soft, good bowel sounds. Incision clean. Lungs clear. Good u/o K 3.3 , rest ok. Doing well. Encouraged oob and ambulation.

## 2015-02-27 LAB — SURGICAL PATHOLOGY

## 2015-02-27 LAB — POTASSIUM: POTASSIUM: 3.7 mmol/L (ref 3.5–5.1)

## 2015-02-27 MED ORDER — LISINOPRIL 10 MG PO TABS: 10.0000 mg | ORAL_TABLET | Freq: Every day | ORAL | Status: DC

## 2015-02-27 NOTE — Progress Notes (Signed)
Patient ID: Briana Snyder, female   DOB: 07/13/1965, 49 y.o.   MRN: [id number] No complaints. Afebrile. BP tending to be high at times-pt with no prior history of hypertension. Abd is soft, mildly distended, active bowel sounds.Incision is clean. Good u/o. K 3.7 Start clear liq diet. D/C foley. IM consult for BP

## 2015-02-27 NOTE — Consult Note (Signed)
[place] at [facility] Internal medicine consultation   PATIENT NAME: Briana Snyder    MR#:  [id number]  DATE OF BIRTH:  1966-01-07  DATE OF ADMISSION:  02/25/2015  PRIMARY CARE PHYSICIAN: Ronnell Freshwater, NP   REQUESTING/REFERRING PHYSICIAN: Dr. Jamal Collin  CHIEF COMPLAINT:  No chief complaint on file.  Hypertension  HISTORY OF PRESENT ILLNESS:  Briana Snyder  is a 49 y.o. female with a known history of GERD, anxiety, asthma who presented on 10/4 for laparoscopic right hemicolectomy due to finding of cecal polyp on colonoscopy in September 2016. Surgery was performed without complication and she has been recovering well. Today she has had a small bowel movement. She continues to have moderate abdominal pain at the incision site. She has a cough productive of clear sputum and this is very painful over the incision site. Hospitalist service was asked to consult regarding hypertension. She does not have a known history of hypertension.   PAST MEDICAL HISTORY:   Past Medical History  Diagnosis Date  . Asthma   . Anxiety   . GERD (gastroesophageal reflux disease)   . Anemia   . Headache     PAST SURGICAL HISTORY:   Past Surgical History  Procedure Laterality Date  . Thyroidectomy  1987    Right  . Endometrial polyps  08/2010    resected  . Hernia repair  2000    inguinal   . Breast biopsy Left 08/2010    benign  . Breast lumpectomy Left 2012  . Colonoscopy N/A 01/28/2015    Procedure: COLONOSCOPY;  Surgeon: Christene Lye, MD;  Location: [facility] ENDOSCOPY;  Service: Endoscopy;  Laterality: N/A;  . Varicose vein surgery    . Laparoscopic right hemi colectomy Right 02/25/2015    Procedure: LAPAROSCOPIC RIGHT HEMI COLECTOMY;  Surgeon: Christene Lye, MD;  Location: [facility] ORS;  Service: General;  Laterality: Right;    SOCIAL HISTORY:   Social History  Substance Use Topics  . Smoking status: Former Smoker -- 0.50 packs/day  for 18 years    Types: Cigarettes    Quit date: 05/25/2011  . Smokeless tobacco: Never Used  . Alcohol Use: 0.0 oz/week    0 Standard drinks or equivalent per week     Comment: pt states she drinks beer almost every day-pt unsure when asked how many cans of beer     FAMILY HISTORY:   Family History  Problem Relation Age of Onset  . Breast cancer Mother 66  . Prostate cancer Father     DRUG ALLERGIES:  No Known Allergies  REVIEW OF SYSTEMS:   Review of Systems  Constitutional: Negative for fever, chills, weight loss and malaise/fatigue.  HENT: Negative for congestion and hearing loss.   Eyes: Negative for blurred vision and pain.  Respiratory: Positive for cough and sputum production. Negative for hemoptysis, shortness of breath and stridor.   Cardiovascular: Negative for chest pain, palpitations, orthopnea and leg swelling.  Gastrointestinal: Positive for abdominal pain. Negative for nausea, vomiting, diarrhea, constipation and blood in stool.  Genitourinary: Negative for dysuria and frequency.  Musculoskeletal: Negative for myalgias, back pain, joint pain and neck pain.  Skin: Negative for rash.  Neurological: Negative for focal weakness, loss of consciousness and headaches.  Endo/Heme/Allergies: Does not bruise/bleed easily.  Psychiatric/Behavioral: Negative for depression and hallucinations. The patient is not nervous/anxious.     MEDICATIONS AT HOME:   Prior to Admission medications   Medication Sig Start Date End Date Taking?  Authorizing Provider  busPIRone (BUSPAR) 10 MG tablet Take 1 tablet by mouth 2 (two) times daily. 12/24/14  Yes Historical Provider, MD  butalbital-aspirin-caffeine-codeine (BUTALBITAL COMPOUND/CODEINE) 50-325-40-30 MG capsule Take 1 capsule by mouth 3 (three) times daily as needed for pain.   Yes Historical Provider, MD  clotrimazole-betamethasone (LOTRISONE) cream apply externally to affected area twice a day for 2 weeks 10/01/14  Yes Historical  Provider, MD  fluticasone (FLONASE) 50 MCG/ACT nasal spray Place 1 spray into both nostrils as needed.    Yes Historical Provider, MD  Iron TABS Take 1 tablet by mouth daily.   Yes Historical Provider, MD  omeprazole (PRILOSEC) 40 MG capsule Take 40 mg by mouth every evening. 12/24/14  Yes Historical Provider, MD  PROAIR HFA 108 (90 BASE) MCG/ACT inhaler Inhale 1 puff into the lungs every 4 (four) hours as needed. 12/24/14  Yes Historical Provider, MD      VITAL SIGNS:  Blood pressure 107/88, pulse 104, temperature 99.8 F (37.7 C), temperature source Oral, resp. rate 16, height 5\' 6"  (1.676 m), weight 89.041 kg (196 lb 4.8 oz), last menstrual period 02/08/2015, SpO2 98 %.  PHYSICAL EXAMINATION:  GENERAL:  49 y.o.-year-old patient lying in the bed with no acute distress.  EYES: Pupils equal, round, reactive to light and accommodation. No scleral icterus. Extraocular muscles intact.  HEENT: Head atraumatic, normocephalic. Oropharynx and nasopharynx clear.  NECK:  Supple, no jugular venous distention. No thyroid enlargement, no tenderness.  LUNGS: Normal breath sounds bilaterally, no wheezing, rales,rhonchi or crepitation. No use of accessory muscles of respiration.  CARDIOVASCULAR: S1, S2 normal. No murmurs, rubs, or gallops.  ABDOMEN: Soft, tender, nondistended. Bowel sounds present. No organomegaly or mass.  EXTREMITIES: No pedal edema, cyanosis, or clubbing.  NEUROLOGIC: Cranial nerves II through XII are intact. Muscle strength 5/5 in all extremities. Sensation intact. Gait not checked.  PSYCHIATRIC: The patient is alert and oriented x 3.  SKIN:Midline vertical surgical wound is covered in honeycomb dressing, stapled, no drainage, no erythema, tender   LABORATORY PANEL:   CBC  Recent Labs Lab 02/26/15 0538  WBC 6.8  HGB 11.0*  HCT 33.5*  PLT 192   ------------------------------------------------------------------------------------------------------------------  Chemistries    Recent Labs Lab 02/26/15 0538 02/27/15 0442  NA 138  --   K 3.3* 3.7  CL 103  --   CO2 28  --   GLUCOSE 114*  --   BUN <5*  --   CREATININE 0.84  --   CALCIUM 8.2*  --    ------------------------------------------------------------------------------------------------------------------  Cardiac Enzymes No results for input(s): TROPONINI in the last 168 hours. ------------------------------------------------------------------------------------------------------------------  RADIOLOGY:  No results found.  EKG:  No orders found for this or any previous visit.  IMPRESSION AND PLAN:   #1 hypertension: Blood pressure peaked at 161/68 yesterday morning. Has been declining since that time. She continues to have moderate pain which could drive a blood pressure. She is also on IV fluids. We will discontinue IV fluids. I will not start any antihypertensives at this time. Continue to monitor.  #2 postop course: Status post right hemicolectomy due to: Cecil polyp. Recovering well, had bowel movement today. Management per primary surgical team. Continue pain control. Encourage ambulation  #3 GERD - Continue Protonix  #4 anxiety - Continue BuSpar, Ambien as needed  Thank you for consulting the medicine service. We will continue to follow this patient with you   CODE STATUS: Full   TOTAL TIME TAKING CARE OF THIS PATIENT: 30 minutes.  Greater  than 50% of time spent in coordination of care and counseling.  Myrtis Ser M.D on 02/27/2015 at 1:30 PM  Between 7am to 6pm - Pager - [phone]  After 6pm go to [email or web address] - password [email or web address]  Office  [phone]  CC: Primary care physician; Ronnell Freshwater, NP

## 2015-02-28 NOTE — Progress Notes (Signed)
[place] at [place] NAME: Briana Snyder    MR#:  [id number]  DATE OF BIRTH:  [birth year]  SUBJECTIVE:  CHIEF COMPLAINT:  No chief complaint on file.  No complaints this morning. Bandages have been removed and she is been walking in the room. Blood pressure has been controlled. Tolerating diet.  REVIEW OF SYSTEMS:   Review of Systems  Constitutional: Negative for fever.  Respiratory: Negative for shortness of breath.   Cardiovascular: Negative for chest pain and palpitations.  Gastrointestinal: Positive for abdominal pain. Negative for nausea and vomiting.  Genitourinary: Negative for dysuria.    DRUG ALLERGIES:  No Known Allergies  VITALS:  Blood pressure 100/72, pulse 96, temperature 98.8 F (37.1 C), temperature source Oral, resp. rate 18, height 5\' 6"  (1.676 m), weight 89.041 kg (196 lb 4.8 oz), last menstrual period 02/08/2015, SpO2 96 %.  PHYSICAL EXAMINATION:  GENERAL:  49 y.o.-year-old patient lying in the bed with no acute distress.  EYES: Pupils equal, round, reactive to light and accommodation. No scleral icterus. Extraocular muscles intact.  HEENT: Head atraumatic, normocephalic. Oropharynx and nasopharynx clear.  NECK:  Supple, no jugular venous distention. No thyroid enlargement, no tenderness.  LUNGS: Normal breath sounds bilaterally, no wheezing, rales,rhonchi or crepitation. No use of accessory muscles of respiration.  CARDIOVASCULAR: S1, S2 normal. No murmurs, rubs, or gallops.  ABDOMEN: Soft, tender over incision which has been stapled., nondistended. Bowel sounds present. No organomegaly or mass.  EXTREMITIES: No pedal edema, cyanosis, or clubbing.  NEUROLOGIC: Cranial nerves II through XII are intact. Muscle strength 5/5 in all extremities. Sensation intact. Gait not checked.  PSYCHIATRIC: The patient is alert and oriented x 3.  SKIN: No obvious rash, lesion, or ulcer. Surgical wounds are clean with no  exudate, erythema, induration    LABORATORY PANEL:   CBC  Recent Labs Lab 02/26/15 0538  WBC 6.8  HGB 11.0*  HCT 33.5*  PLT 192   ------------------------------------------------------------------------------------------------------------------  Chemistries   Recent Labs Lab 02/26/15 0538 02/27/15 0442  NA 138  --   K 3.3* 3.7  CL 103  --   CO2 28  --   GLUCOSE 114*  --   BUN <5*  --   CREATININE 0.84  --   CALCIUM 8.2*  --    ------------------------------------------------------------------------------------------------------------------  Cardiac Enzymes No results for input(s): TROPONINI in the last 168 hours. ------------------------------------------------------------------------------------------------------------------  RADIOLOGY:  No results found.  EKG:  No orders found for this or any previous visit.  ASSESSMENT AND PLAN:    #1 hypertension:  - Elevations were likely due to pain. Now controlled. Does not need antihypertensives.  #2 postop course: Status post right hemicolectomy due to: Cecil polyp.  - Recovering well. Plan is for discharge in the morning per the surgical service  #3 GERD - Continue Protonix  #4 anxiety - Continue BuSpar, Ambien as needed  Thank you for consultation is on this patient. We'll sign off at this time  CODE STATUS: Full   TOTAL TIME TAKING CARE OF THIS PATIENT: 15 minutes.  Greater than 50% of time spent in care coordination and counseling. Care plan discussed with the patient her friend and daughter at the bedside POSSIBLE D/C IN 1 DAYS, DEPENDING ON CLINICAL CONDITION.   Myrtis Ser M.D on 02/28/2015 at 1:33 PM  Between 7am to 6pm - Pager - [phone]  After 6pm go to [email or web address] - [facility]  Office  [phone]  CC: Primary care physician; Ronnell Freshwater, NP

## 2015-02-28 NOTE — Progress Notes (Signed)
Patient ID: Briana Snyder, female   DOB: [birth year], 49 y.o.   MRN: [id number] One time temp rise to 100, resolved. No complaints. Bowels moved. No n/v. VSS with stable BP. Abd is soft, incision clean, good bowel sounds. Doing well. Advance diet. Home in am if stable. 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 Pt advised on this fully.

## 2015-03-01 LAB — BASIC METABOLIC PANEL
ANION GAP: 4 — AB (ref 5–15)
BUN: 6 mg/dL (ref 6–20)
CALCIUM: 8 mg/dL — AB (ref 8.9–10.3)
CO2: 26 mmol/L (ref 22–32)
Chloride: 107 mmol/L (ref 101–111)
Creatinine, Ser: 0.96 mg/dL (ref 0.44–1.00)
GFR calc Af Amer: >60 mL/min (ref >60)
GLUCOSE: 108 mg/dL — AB (ref 65–99)
Potassium: 3.1 mmol/L — ABNORMAL LOW (ref 3.5–5.1)
Sodium: 137 mmol/L (ref 135–145)

## 2015-03-01 LAB — CBC
HEMATOCRIT: 30.9 % — AB (ref 35.0–47.0)
Hemoglobin: 10 g/dL — ABNORMAL LOW (ref 12.0–16.0)
MCH: 27.9 pg (ref 26.0–34.0)
MCHC: 32.4 g/dL (ref 32.0–36.0)
MCV: 86.1 fL (ref 80.0–100.0)
PLATELETS: 179 10*3/uL (ref 150–440)
RBC: 3.59 MIL/uL — ABNORMAL LOW (ref 3.80–5.20)
RDW: 18.2 % — AB (ref 11.5–14.5)
WBC: 5.9 10*3/uL (ref 3.6–11.0)

## 2015-03-01 MED ORDER — INFLUENZA VAC SPLIT QUAD 0.5 ML IM SUSY: 0.5000 mL | PREFILLED_SYRINGE | INTRAMUSCULAR | Status: DC

## 2015-03-01 MED ORDER — HYDROCODONE-ACETAMINOPHEN 5-325 MG PO TABS: 1.0000 | ORAL_TABLET | Freq: Four times a day (QID) | ORAL | Status: DC | PRN

## 2015-03-01 NOTE — Discharge Instructions (Signed)
Heating pad to abdomen for comfort.  Alcohol: If needed for soreness.  Norco (hydrocodone) if needed for pain.  Diet as tolerated.  No lifting over 10 pounds.  No driving until pain-free.  You may shower any time.  Come Dr. Angie Fava office next Wednesday for staple removal.

## 2015-03-01 NOTE — Progress Notes (Signed)
Pt alert and oriented. Discharge summary given to patient. IV site removed. Concerns addressed.

## 2015-03-05 ENCOUNTER — Ambulatory Visit (INDEPENDENT_AMBULATORY_CARE_PROVIDER_SITE_OTHER): Payer: Medicaid Other | Admitting: *Deleted

## 2015-03-05 DIAGNOSIS — K6389 Other specified diseases of intestine: Secondary | ICD-10-CM

## 2015-03-05 NOTE — Progress Notes (Signed)
The staples were removed and steri strips applied. No sign of infection.

## 2015-03-05 NOTE — Patient Instructions (Signed)
Patient to return as scheduled.  

## 2015-03-10 NOTE — Discharge Summary (Signed)
Physician Discharge Summary  Patient ID: Briana Snyder MRN: [id number] DOB/AGE: May 17, 1966 49 y.o.  Admit date: 02/25/2015 Discharge date: 03/10/2015  Admission Diagnoses: Polyp with dysplasia cecum  Discharge Diagnoses: Invasive adenocarcinoma cecum Active Problems:   Benign neoplasm of cecum   Discharged Condition: good  Hospital Course: Pt underwent laparoscopy and right hemicolectomy as planned on 02/25/15. Procedure was uneventful. Postoperative course was uncomplicated. She did have mildly elevated BP with no prior history. IM consult was obtained. BP settled down with no treatment required. She was started on solid food on 02/28/15 after she had return of bowel activity. Discharged home on 03/01/15   Consults: internl medicine.  Significant Diagnostic Studies: pathology  Treatments: surgery: laparoscopy, right hemicolectomy  Discharge Exam: Blood pressure 97/67, pulse 101, temperature 98.9 F (37.2 C), temperature source Oral, resp. rate 18, height 5\' 6"  (1.676 m), weight 196 lb 4.8 oz (89.041 kg), last menstrual period 02/08/2015, SpO2 95 %. GI: soft, non-tender; bowel sounds normal; no masses,  no organomegaly. Incision clean and healing well  Disposition: 01-Home or Self Care     Medication List    TAKE these medications        busPIRone 10 MG tablet  Commonly known as:  BUSPAR  Take 1 tablet by mouth 2 (two) times daily.     BUTALBITAL COMPOUND/CODEINE 50-325-40-30 MG capsule  Generic drug:  butalbital-aspirin-caffeine-codeine  Take 1 capsule by mouth 3 (three) times daily as needed for pain.     clotrimazole-betamethasone cream  Commonly known as:  LOTRISONE  apply externally to affected area twice a day for 2 weeks     fluticasone 50 MCG/ACT nasal spray  Commonly known as:  FLONASE  Place 1 spray into both nostrils as needed.     HYDROcodone-acetaminophen 5-325 MG tablet  Commonly known as:  NORCO  Take 1-2 tablets by mouth every 6 (six) hours as  needed for moderate pain.     Influenza vac split quadrivalent PF 0.5 ML injection  Commonly known as:  FLUARIX  Inject 0.5 mLs into the muscle tomorrow at 10 am.     Iron Tabs  Take 1 tablet by mouth daily.     omeprazole 40 MG capsule  Commonly known as:  PRILOSEC  Take 40 mg by mouth every evening.     PROAIR HFA 108 (90 BASE) MCG/ACT inhaler  Generic drug:  albuterol  Inhale 1 puff into the lungs every 4 (four) hours as needed.           Follow-up Information    Follow up with Christene Lye, MD In 2 weeks.   Specialties:  General Surgery, Radiology   Why:  post op check   Contact information:   [place] [address] [phone]       Signed: Christene Lye 03/10/2015, 4:26 PM

## 2015-03-13 ENCOUNTER — Ambulatory Visit (INDEPENDENT_AMBULATORY_CARE_PROVIDER_SITE_OTHER): Payer: Medicaid Other | Admitting: General Surgery

## 2015-03-13 ENCOUNTER — Encounter: Payer: Self-pay | Admitting: General Surgery

## 2015-03-13 VITALS — BP 112/84 | HR 78 | Resp 12 | Ht 66.0 in | Wt 180.0 lb

## 2015-03-13 DIAGNOSIS — C180 Malignant neoplasm of cecum: Secondary | ICD-10-CM

## 2015-03-13 NOTE — Progress Notes (Signed)
This is a 49 year old female here today-lap/ right  hemi colectomy done on 02/25/15.Patient states she is doing well.  Incision looks clear and healing well. Lungs are clear and abdomenl is soft.  Path- T2,N0,M0 invasive CA cecum. Margins clear. Patient to return in one month. No exertional activity. May drive.

## 2015-03-13 NOTE — Patient Instructions (Signed)
Patient to return in one month. 

## 2015-04-10 ENCOUNTER — Ambulatory Visit (INDEPENDENT_AMBULATORY_CARE_PROVIDER_SITE_OTHER): Payer: Medicaid Other | Admitting: General Surgery

## 2015-04-10 ENCOUNTER — Ambulatory Visit: Payer: Medicaid Other | Admitting: General Surgery

## 2015-04-10 ENCOUNTER — Encounter: Payer: Self-pay | Admitting: General Surgery

## 2015-04-10 VITALS — BP 130/88 | HR 78 | Resp 12 | Ht 64.0 in | Wt 179.0 lb

## 2015-04-10 DIAGNOSIS — C180 Malignant neoplasm of cecum: Secondary | ICD-10-CM

## 2015-04-10 NOTE — Progress Notes (Signed)
This is a 49 year old female here today-lap/ right hemi colectomy done on 02/25/15.Patient states she is doing well. She feels stronger, has not had any abdominal pain. I have reviewed the history of present illness with the patient.    Incision well healedl. Lungs are clear and abdomen  is soft, non tender. T2,N0,M0 CA cecum. Recovered well from right colectomy. Patient to return in four months. CEA ordered

## 2015-04-10 NOTE — Patient Instructions (Addendum)
Patient to return in four months. 

## 2015-04-11 LAB — CEA: CEA: 1.2 ng/mL (ref 0.0–4.7)

## 2015-05-29 ENCOUNTER — Other Ambulatory Visit: Payer: Self-pay | Admitting: *Deleted

## 2015-05-29 DIAGNOSIS — C180 Malignant neoplasm of cecum: Secondary | ICD-10-CM

## 2015-06-04 ENCOUNTER — Encounter: Payer: Self-pay | Admitting: *Deleted

## 2015-08-06 ENCOUNTER — Encounter: Payer: Self-pay | Admitting: *Deleted

## 2015-08-06 ENCOUNTER — Encounter: Payer: Self-pay | Admitting: General Surgery

## 2015-08-06 ENCOUNTER — Ambulatory Visit (INDEPENDENT_AMBULATORY_CARE_PROVIDER_SITE_OTHER): Payer: Medicaid Other | Admitting: General Surgery

## 2015-08-06 VITALS — BP 132/86 | HR 78 | Resp 14 | Ht 66.0 in | Wt 176.0 lb

## 2015-08-06 DIAGNOSIS — C180 Malignant neoplasm of cecum: Secondary | ICD-10-CM

## 2015-08-06 NOTE — Progress Notes (Signed)
Patient ID: Briana Snyder, female   DOB: [birth year], 50 y.o.   MRN: [id number]  Chief Complaint  Patient presents with  . Colon Cancer    HPI Briana Snyder is a 50 y.o. female here today for her follow up colon cancer. Patient was send to lab today for CEA. She states she is doing well. Bowels move daily and states the stool is "skinny" since the surgery. She does feel some "tension " in her lower abdomen. No nausea or vomiting. Overall, she has been doing very well. I have reviewed the history of present illness with the patient.   HPI  Past Medical History  Diagnosis Date  . Asthma   . Anxiety   . GERD (gastroesophageal reflux disease)   . Anemia   . Headache     Past Surgical History  Procedure Laterality Date  . Thyroidectomy  1987    Right  . Endometrial polyps  08/2010    resected  . Hernia repair  2000    inguinal   . Breast biopsy Left 08/2010    benign  . Breast lumpectomy Left 2012  . Colonoscopy N/A 01/28/2015    Procedure: COLONOSCOPY;  Surgeon: Christene Lye, MD;  Location: [facility];  Service: Endoscopy;  Laterality: N/A;  . Varicose vein surgery    . Laparoscopic right hemi colectomy Right 02/25/2015    Procedure: LAPAROSCOPIC RIGHT HEMI COLECTOMY;  Surgeon: Christene Lye, MD;  Location: [facility] ORS;  Service: General;  Laterality: Right;    Family History  Problem Relation Age of Onset  . Breast cancer Mother 107  . Prostate cancer Father     Social History Social History  Substance Use Topics  . Smoking status: Former Smoker -- 0.50 packs/day for 18 years    Types: Cigarettes    Quit date: 05/25/2011  . Smokeless tobacco: Never Used  . Alcohol Use: 0.0 oz/week    0 Standard drinks or equivalent per week     Comment: pt states she drinks beer almost every day-pt unsure when asked how many cans of beer     No Known Allergies  Current Outpatient Prescriptions  Medication Sig Dispense Refill  . busPIRone (BUSPAR) 10 MG tablet  Take 1 tablet by mouth 2 (two) times daily.  0  . butalbital-aspirin-caffeine-codeine (BUTALBITAL COMPOUND/CODEINE) 50-325-40-30 MG capsule Take 1 capsule by mouth 3 (three) times daily as needed for pain.    . fluticasone (FLONASE) 50 MCG/ACT nasal spray Place 1 spray into both nostrils as needed.     Marland Kitchen omeprazole (PRILOSEC) 40 MG capsule Take 40 mg by mouth every evening.  0  . PROAIR HFA 108 (90 BASE) MCG/ACT inhaler Inhale 1 puff into the lungs every 4 (four) hours as needed.  0   No current facility-administered medications for this visit.    Review of Systems Review of Systems  Constitutional: Negative.   Respiratory: Negative.   Cardiovascular: Negative.   Gastrointestinal: Negative for nausea, vomiting, diarrhea and constipation.    Blood pressure 132/86, pulse 78, resp. rate 14, height 5\' 6"  (1.676 m), weight 176 lb (79.833 kg).  Physical Exam Physical Exam  Constitutional: She is oriented to person, place, and time. She appears well-developed and well-nourished.  HENT:  Mouth/Throat: Oropharynx is clear and moist.  Eyes: Conjunctivae are normal. No scleral icterus.  Neck: Neck supple.  Cardiovascular: Normal rate, regular rhythm and normal heart sounds.   Pulmonary/Chest: Effort normal and breath sounds normal.  Abdominal: Soft. Normal appearance and bowel  sounds are normal. She exhibits no distension. There is no hepatomegaly. There is no tenderness. No hernia.  Incision is well healed. No palpable mass or hernia  Lymphadenopathy:    She has no cervical adenopathy.  Neurological: She is alert and oriented to person, place, and time.  Skin: Skin is warm and dry.  Psychiatric: Her behavior is normal.    Data Reviewed Notes reviewed  Assessment    Stable exam, no new findings. 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     Plan   follow up in 6 months. Will need repeat colonoscopy at that time. CEA today to be monitored today and for the next  visit    PCP:  [place] This information has been scribed by Gaspar Cola CMA.     Shamell Hittle G 08/06/2015, 1:16 PM

## 2015-08-06 NOTE — Patient Instructions (Signed)
The patient is aware to call back for any questions or concerns.  

## 2015-08-07 ENCOUNTER — Telehealth: Payer: Self-pay | Admitting: *Deleted

## 2015-08-07 LAB — CEA: CEA: 2.2 ng/mL (ref 0.0–4.7)

## 2015-08-07 NOTE — Progress Notes (Signed)
Quick Note:  Inform pt labs are normal. F/u as scheduled ______ 

## 2015-08-07 NOTE — Telephone Encounter (Signed)
Notified patient as instructed, patient pleased. Discussed follow-up appointments, patient agrees  

## 2015-08-07 NOTE — Telephone Encounter (Signed)
-----   Message from Christene Lye, MD sent at 08/07/2015  9:11 AM EDT ----- Inform pt labs are normal. F/u as scheduled

## 2015-10-07 ENCOUNTER — Other Ambulatory Visit: Payer: Self-pay | Admitting: Nurse Practitioner

## 2015-10-07 DIAGNOSIS — Z1231 Encounter for screening mammogram for malignant neoplasm of breast: Secondary | ICD-10-CM

## 2015-10-28 ENCOUNTER — Ambulatory Visit: Payer: Medicaid Other | Attending: Nurse Practitioner

## 2015-12-05 ENCOUNTER — Ambulatory Visit
Admission: RE | Admit: 2015-12-05 | Discharge: 2015-12-05 | Disposition: A | Discharge status: Home / Self Care | Payer: Medicaid Other | Source: Ambulatory Visit | Attending: Nurse Practitioner | Admitting: Nurse Practitioner

## 2015-12-05 DIAGNOSIS — Z1231 Encounter for screening mammogram for malignant neoplasm of breast: Secondary | ICD-10-CM | POA: Diagnosis not present

## 2015-12-18 ENCOUNTER — Encounter: Payer: Self-pay | Admitting: *Deleted

## 2016-02-05 ENCOUNTER — Encounter: Payer: Self-pay | Admitting: *Deleted

## 2016-02-12 ENCOUNTER — Ambulatory Visit (INDEPENDENT_AMBULATORY_CARE_PROVIDER_SITE_OTHER): Payer: Medicaid Other | Admitting: General Surgery

## 2016-02-12 VITALS — BP 108/82 | HR 82 | Resp 12 | Ht 66.0 in | Wt 181.0 lb

## 2016-02-12 DIAGNOSIS — C180 Malignant neoplasm of cecum: Secondary | ICD-10-CM | POA: Diagnosis not present

## 2016-02-12 NOTE — Progress Notes (Signed)
Patient ID: Briana Snyder, female   DOB: Sep 06, 1965, 50 y.o.   MRN: [id number]  Chief Complaint  Patient presents with  . Follow-up  . Colonoscopy    HPI Briana Snyder is a 50 y.o. female. Here for follow up colon cancer and colonoscopy discussion. The last colonoscopy was completed 2016. Denies any gastrointestinal issues. Bowels move regular (4-5 times a day) and no bleeding noted. Recent complaints of chest pain, cough and shortness of breath. I have reviewed the history of present illness with the patient.  HPI  Past Medical History:  Diagnosis Date  . Anemia   . Anxiety   . Asthma   . GERD (gastroesophageal reflux disease)   . Headache     Past Surgical History:  Procedure Laterality Date  . BREAST BIOPSY Left 08/2010   benign  . BREAST LUMPECTOMY Left 2012  . COLONOSCOPY N/A 01/28/2015   Procedure: COLONOSCOPY;  Surgeon: Christene Lye, MD;  Location: [facility];  Service: Endoscopy;  Laterality: N/A;  . endometrial polyps  08/2010   resected  . HERNIA REPAIR  2000   inguinal   . LAPAROSCOPIC RIGHT HEMI COLECTOMY Right 02/25/2015   Procedure: LAPAROSCOPIC RIGHT HEMI COLECTOMY;  Surgeon: Christene Lye, MD;  Location: [facility] ORS;  Service: General;  Laterality: Right;  . THYROIDECTOMY  1987   Right  . VARICOSE VEIN SURGERY      Family History  Problem Relation Age of Onset  . Breast cancer Mother 64  . Prostate cancer Father     Social History Social History  Substance Use Topics  . Smoking status: Former Smoker    Packs/day: 0.50    Years: 18.00    Types: Cigarettes    Quit date: 05/25/2011  . Smokeless tobacco: Never Used  . Alcohol use 0.0 oz/week     Comment: pt states she drinks beer almost every day-pt unsure when asked how many cans of beer     No Known Allergies  Current Outpatient Prescriptions  Medication Sig Dispense Refill  . busPIRone (BUSPAR) 10 MG tablet Take 1 tablet by mouth 2 (two) times daily.  0  . fluticasone  (FLONASE) 50 MCG/ACT nasal spray Place 1 spray into both nostrils as needed.     Marland Kitchen omeprazole (PRILOSEC) 40 MG capsule Take 40 mg by mouth as needed.   0  . Potassium Chloride CR (MICRO-K) 8 MEQ CPCR capsule CR Take 8 mEq by mouth daily.   0  . PROAIR HFA 108 (90 BASE) MCG/ACT inhaler Inhale 1 puff into the lungs every 4 (four) hours as needed.  0   No current facility-administered medications for this visit.     Review of Systems Review of Systems  Constitutional: Negative.   Respiratory: Positive for cough, chest tightness and shortness of breath.   Cardiovascular: Negative.     Blood pressure 108/82, pulse 82, resp. rate 12, height 5\' 6"  (1.676 m), weight 181 lb (82.1 kg).  Physical Exam Physical Exam  Constitutional: She is oriented to person, place, and time. She appears well-developed and well-nourished.  HENT:  Mouth/Throat: Oropharynx is clear and moist.  Eyes: Conjunctivae are normal. No scleral icterus.  Neck: Neck supple.  Cardiovascular: Normal rate, regular rhythm and normal heart sounds.   Pulmonary/Chest: Effort normal and breath sounds normal.  Abdominal: Soft. Bowel sounds are normal. There is no tenderness.  Lymphadenopathy:    She has no cervical adenopathy.  Neurological: She is alert and oriented to person, place, and time.  Skin:  Skin is warm and dry.  Psychiatric: Her behavior is normal.    Data Reviewed Notes reviewed  Assessment    Stable exam, no new findings, post right colectomy for T2N0M0 cecal carcinoma-1 yr out      Plan    CBC, Hepatic function, CEA labs today.     Colonoscopy with possible biopsy/polypectomy prn: Information regarding the procedure, including its potential risks and complications (including but not limited to perforation of the bowel, which may require emergency surgery to repair, and bleeding) was verbally given to the patient. Educational information regarding lower intestinal endoscopy was given to the patient.  Written instructions for how to complete the bowel prep using Miralax were provided. The importance of drinking ample fluids to avoid dehydration as a result of the prep emphasized.  Follow up in 6 months.  This information has been scribed by Karie Fetch RN, BSN,BC.   Bodin Gorka G 02/17/2016, 10:24 AM

## 2016-02-12 NOTE — Patient Instructions (Addendum)
The patient is aware to call back for any questions or concerns.  Colonoscopy A colonoscopy is an exam to look at the entire large intestine (colon). This exam can help find problems such as tumors, polyps, inflammation, and areas of bleeding. The exam takes about 1 hour.  LET [facility] CARE PROVIDER KNOW ABOUT:   Any allergies you have.  All medicines you are taking, including vitamins, herbs, eye drops, creams, and over-the-counter medicines.  Previous problems you or members of your family have had with the use of anesthetics.  Any blood disorders you have.  Previous surgeries you have had.  Medical conditions you have. RISKS AND COMPLICATIONS  Generally, this is a safe procedure. However, as with any procedure, complications can occur. Possible complications include:  Bleeding.  Tearing or rupture of the colon wall.  Reaction to medicines given during the exam.  Infection (rare). BEFORE THE PROCEDURE   Ask your health care provider about changing or stopping your regular medicines.  You may be prescribed an oral bowel prep. This involves drinking a large amount of medicated liquid, starting the day before your procedure. The liquid will cause you to have multiple loose stools until your stool is almost clear or light green. This cleans out your colon in preparation for the procedure.  Do not eat or drink anything else once you have started the bowel prep, unless your health care provider tells you it is safe to do so.  Arrange for someone to drive you home after the procedure. PROCEDURE   You will be given medicine to help you relax (sedative).  You will lie on your side with your knees bent.  A long, flexible tube with a light and camera on the end (colonoscope) will be inserted through the rectum and into the colon. The camera sends video back to a computer screen as it moves through the colon. The colonoscope also releases carbon dioxide gas to inflate the colon.  This helps your health care provider see the area better.  During the exam, your health care provider may take a small tissue sample (biopsy) to be examined under a microscope if any abnormalities are found.  The exam is finished when the entire colon has been viewed. AFTER THE PROCEDURE   Do not drive for 24 hours after the exam.  You may have a small amount of blood in your stool.  You may pass moderate amounts of gas and have mild abdominal cramping or bloating. This is caused by the gas used to inflate your colon during the exam.  Ask when your test results will be ready and how you will get your results. Make sure you get your test results.   This information is not intended to replace advice given to you by your health care provider. Make sure you discuss any questions you have with your health care provider.   Document Released: 05/07/2000 Document Revised: 02/28/2013 Document Reviewed: 01/15/2013 Elsevier Interactive Patient Education [employer].

## 2016-02-13 LAB — CBC WITH DIFFERENTIAL/PLATELET
BASOS: 1 %
Basophils Absolute: 0 10*3/uL (ref 0.0–0.2)
EOS (ABSOLUTE): 0.1 10*3/uL (ref 0.0–0.4)
EOS: 2 %
HEMATOCRIT: 36.7 % (ref 34.0–46.6)
Hemoglobin: 12.3 g/dL (ref 11.1–15.9)
Immature Grans (Abs): 0 10*3/uL (ref 0.0–0.1)
Immature Granulocytes: 0 %
LYMPHS ABS: 1.2 10*3/uL (ref 0.7–3.1)
Lymphs: 29 %
MCH: 30.2 pg (ref 26.6–33.0)
MCHC: 33.5 g/dL (ref 31.5–35.7)
MCV: 90 fL (ref 79–97)
MONOS ABS: 0.4 10*3/uL (ref 0.1–0.9)
Monocytes: 9 %
NEUTROS ABS: 2.5 10*3/uL (ref 1.4–7.0)
Neutrophils: 59 %
Platelets: 177 10*3/uL (ref 150–379)
RBC: 4.07 x10E6/uL (ref 3.77–5.28)
RDW: 15.9 % — AB (ref 12.3–15.4)
WBC: 4.1 10*3/uL (ref 3.4–10.8)

## 2016-02-13 LAB — CEA: CEA: 1.8 ng/mL (ref 0.0–4.7)

## 2016-02-13 LAB — HEPATIC FUNCTION PANEL
ALBUMIN: 4.3 g/dL (ref 3.5–5.5)
ALT: 11 IU/L (ref 0–32)
AST: 24 IU/L (ref 0–40)
Alkaline Phosphatase: 96 IU/L (ref 39–117)
BILIRUBIN TOTAL: 0.6 mg/dL (ref 0.0–1.2)
BILIRUBIN, DIRECT: 0.16 mg/dL (ref 0.00–0.40)
Total Protein: 6.9 g/dL (ref 6.0–8.5)

## 2016-02-13 NOTE — Progress Notes (Signed)
Inform pt labs are normal. F/u as scheduled

## 2016-02-17 ENCOUNTER — Encounter: Payer: Self-pay | Admitting: General Surgery

## 2016-02-17 ENCOUNTER — Telehealth: Payer: Self-pay | Admitting: *Deleted

## 2016-02-17 NOTE — Telephone Encounter (Signed)
Notified patient as instructed, patient pleased. Discussed follow-up appointments, patient agrees  

## 2016-02-17 NOTE — Telephone Encounter (Signed)
-----   Message from Christene Lye, MD sent at 02/13/2016  3:55 PM EDT ----- Inform pt labs are normal. F/u as scheduled

## 2016-06-22 ENCOUNTER — Other Ambulatory Visit: Payer: Self-pay | Admitting: Nurse Practitioner

## 2016-06-22 DIAGNOSIS — G44229 Chronic tension-type headache, not intractable: Secondary | ICD-10-CM

## 2016-06-30 ENCOUNTER — Ambulatory Visit: Payer: Medicaid Other

## 2016-07-06 ENCOUNTER — Ambulatory Visit
Admission: RE | Admit: 2016-07-06 | Discharge: 2016-07-06 | Disposition: A | Discharge status: Home / Self Care | Payer: Medicaid Other | Source: Ambulatory Visit | Attending: Nurse Practitioner | Admitting: Nurse Practitioner

## 2016-07-06 DIAGNOSIS — G44229 Chronic tension-type headache, not intractable: Secondary | ICD-10-CM | POA: Insufficient documentation

## 2016-08-18 ENCOUNTER — Encounter: Payer: Self-pay | Admitting: *Deleted

## 2016-08-23 ENCOUNTER — Ambulatory Visit (INDEPENDENT_AMBULATORY_CARE_PROVIDER_SITE_OTHER): Payer: Medicaid Other | Admitting: General Surgery

## 2016-08-23 ENCOUNTER — Ambulatory Visit: Payer: Medicaid Other | Admitting: General Surgery

## 2016-08-23 ENCOUNTER — Encounter: Payer: Self-pay | Admitting: General Surgery

## 2016-08-23 VITALS — BP 130/80 | HR 70 | Resp 12 | Ht 66.0 in | Wt 176.0 lb

## 2016-08-23 DIAGNOSIS — C180 Malignant neoplasm of cecum: Secondary | ICD-10-CM | POA: Diagnosis not present

## 2016-08-23 NOTE — Patient Instructions (Signed)
Colonoscopy, Adult A colonoscopy is an exam to look at the entire large intestine. During the exam, a lubricated, bendable tube is inserted into the anus and then passed into the rectum, colon, and other parts of the large intestine. A colonoscopy is often done as a part of normal colorectal screening or in response to certain symptoms, such as anemia, persistent diarrhea, abdominal pain, and blood in the stool. The exam can help screen for and diagnose medical problems, including:  Tumors.  Polyps.  Inflammation.  Areas of bleeding. Tell a health care provider about:  Any allergies you have.  All medicines you are taking, including vitamins, herbs, eye drops, creams, and over-the-counter medicines.  Any problems you or family members have had with anesthetic medicines.  Any blood disorders you have.  Any surgeries you have had.  Any medical conditions you have.  Any problems you have had passing stool. What are the risks? Generally, this is a safe procedure. However, problems may occur, including:  Bleeding.  A tear in the intestine.  A reaction to medicines given during the exam.  Infection (rare). What happens before the procedure? Eating and drinking restrictions  Follow instructions from your health care provider about eating and drinking, which may include:  A few days before the procedure - follow a low-fiber diet. Avoid nuts, seeds, dried fruit, raw fruits, and vegetables.  1-3 days before the procedure - follow a clear liquid diet. Drink only clear liquids, such as clear broth or bouillon, black coffee or tea, clear juice, clear soft drinks or sports drinks, gelatin dessert, and popsicles. Avoid any liquids that contain red or purple dye.  On the day of the procedure - do not eat or drink anything during the 2 hours before the procedure, or within the time period that your health care provider recommends. Bowel prep  If you were prescribed an oral bowel prep to  clean out your colon:  Take it as told by your health care provider. Starting the day before your procedure, you will need to drink a large amount of medicated liquid. The liquid will cause you to have multiple loose stools until your stool is almost clear or light green.  If your skin or anus gets irritated from diarrhea, you may use these to relieve the irritation:  Medicated wipes, such as adult wet wipes with aloe and vitamin E.  A skin soothing-product like petroleum jelly.  If you vomit while drinking the bowel prep, take a break for up to 60 minutes and then begin the bowel prep again. If vomiting continues and you cannot take the bowel prep without vomiting, call your health care provider. General instructions   Ask your health care provider about changing or stopping your regular medicines. This is especially important if you are taking diabetes medicines or blood thinners.  Plan to have someone take you home from the hospital or clinic. What happens during the procedure?  An IV tube may be inserted into one of your veins.  You will be given medicine to help you relax (sedative).  To reduce your risk of infection:  Your health care team will wash or sanitize their hands.  Your anal area will be washed with soap.  You will be asked to lie on your side with your knees bent.  Your health care provider will lubricate a long, thin, flexible tube. The tube will have a camera and a light on the end.  The tube will be inserted into your anus.    The tube will be gently eased through your rectum and colon.  Air will be delivered into your colon to keep it open. You may feel some pressure or cramping.  The camera will be used to take images during the procedure.  A small tissue sample may be removed from your body to be examined under a microscope (biopsy). If any potential problems are found, the tissue will be sent to a lab for testing.  If small polyps are found, your health  care provider may remove them and have them checked for cancer cells.  The tube that was inserted into your anus will be slowly removed. The procedure may vary among health care providers and hospitals. What happens after the procedure?  Your blood pressure, heart rate, breathing rate, and blood oxygen level will be monitored until the medicines you were given have worn off.  Do not drive for 24 hours after the exam.  You may have a small amount of blood in your stool.  You may pass gas and have mild abdominal cramping or bloating due to the air that was used to inflate your colon during the exam.  It is up to you to get the results of your procedure. Ask your health care provider, or the department performing the procedure, when your results will be ready. This information is not intended to replace advice given to you by your health care provider. Make sure you discuss any questions you have with your health care provider. Document Released: 05/07/2000 Document Revised: 03/10/2016 Document Reviewed: 07/22/2015 Elsevier Interactive Patient Education  2017 Elsevier Inc.  

## 2016-08-23 NOTE — Progress Notes (Signed)
Patient ID: Briana Snyder, female   DOB: 1965/10/23, 51 y.o.   MRN: [id number]  Chief Complaint  Patient presents with  . Follow-up    HPI Briana Snyder is a 51 y.o. female . Here for follow up colon cancer. Patient states she is doing well. No Gi problems. Last colonoscopy was 01/28/2015. She is 89mos post right colectomy for CA I have reviewed the history of present illness with the patient.  HPI  Past Medical History:  Diagnosis Date  . Anemia   . Anxiety   . Asthma   . Cancer ([place])    T2,   . GERD (gastroesophageal reflux disease)   . Headache     Past Surgical History:  Procedure Laterality Date  . BREAST BIOPSY Left 08/2010   benign  . BREAST LUMPECTOMY Left 2012  . COLONOSCOPY N/A 01/28/2015   Procedure: COLONOSCOPY;  Surgeon: Christene Lye, MD;  Location: [facility];  Service: Endoscopy;  Laterality: N/A;  . endometrial polyps  08/2010   resected  . HERNIA REPAIR  2000   inguinal   . LAPAROSCOPIC RIGHT HEMI COLECTOMY Right 02/25/2015   Procedure: LAPAROSCOPIC RIGHT HEMI COLECTOMY;  Surgeon: Christene Lye, MD;  Location: [facility] ORS;  Service: General;  Laterality: Right;  . THYROIDECTOMY  1987   Right  . VARICOSE VEIN SURGERY      Family History  Problem Relation Age of Onset  . Breast cancer Mother 38  . Prostate cancer Father     Social History Social History  Substance Use Topics  . Smoking status: Former Smoker    Packs/day: 0.50    Years: 18.00    Types: Cigarettes    Quit date: 05/25/2011  . Smokeless tobacco: Never Used  . Alcohol use 0.0 oz/week     Comment: pt states she drinks beer almost every day-pt unsure when asked how many cans of beer     No Known Allergies  Current Outpatient Prescriptions  Medication Sig Dispense Refill  . busPIRone (BUSPAR) 10 MG tablet Take 1 tablet by mouth 2 (two) times daily.  0  . fluticasone (FLONASE) 50 MCG/ACT nasal spray Place 1 spray into both nostrils as needed.     Marland Kitchen omeprazole  (PRILOSEC) 40 MG capsule Take 40 mg by mouth as needed.   0  . Potassium Chloride CR (MICRO-K) 8 MEQ CPCR capsule CR Take 8 mEq by mouth daily.   0  . PROAIR HFA 108 (90 BASE) MCG/ACT inhaler Inhale 1 puff into the lungs every 4 (four) hours as needed.  0   No current facility-administered medications for this visit.     Review of Systems Review of Systems  Constitutional: Negative.   Respiratory: Negative.   Cardiovascular: Negative.   Gastrointestinal: Negative.     Blood pressure 130/80, pulse 70, resp. rate 12, height 5\' 6"  (1.676 m), weight 176 lb (79.8 kg).  Physical Exam Physical Exam  Constitutional: She is oriented to person, place, and time. She appears well-developed and well-nourished.  Eyes: Conjunctivae are normal. No scleral icterus.  Neck: Neck supple.  Cardiovascular: Normal rate, regular rhythm and normal heart sounds.   Pulmonary/Chest: Effort normal and breath sounds normal.  Abdominal: Soft. Bowel sounds are normal. She exhibits no mass. There is no hepatomegaly. There is no tenderness. No hernia.  Lymphadenopathy:    She has no cervical adenopathy.  Neurological: She is alert and oriented to person, place, and time.  Skin: Skin is warm and dry.    Data  Reviewed Prior notes reviewed  Assessment    CA Cecum, T2,N0   Stable exam, no new findings, 18 mos post right colectomy   Plan CEA liver panel cbc requested. Next follow up in 6 mos    Colonoscopy with possible biopsy/polypectomy prn: Information regarding the procedure, including its potential risks and complications (including but not limited to perforation of the bowel, which may require emergency surgery to repair, and bleeding) was verbally given to the patient. Educational information regarding lower intestinal endoscopy was given to the patient. Written instructions for how to complete the bowel prep using Miralax were provided. The importance of drinking ample fluids to avoid dehydration as a  result of the prep emphasized.  Patient wishes to call the office back to arrange a date for colonoscopy once she arranges for transportation. This patient was provided with colonoscopy instructions today. Miralax prescription will be sent in once date is arranged.   This information has been scribed by Gaspar Cola CMA.   Andra Matsuo G 08/25/2016, 8:13 AM

## 2016-08-24 ENCOUNTER — Telehealth: Payer: Self-pay | Admitting: *Deleted

## 2016-08-24 ENCOUNTER — Other Ambulatory Visit: Payer: Self-pay | Admitting: Nurse Practitioner

## 2016-08-24 DIAGNOSIS — R0602 Shortness of breath: Secondary | ICD-10-CM

## 2016-08-24 LAB — CBC WITH DIFFERENTIAL/PLATELET
BASOS: 1 %
Basophils Absolute: 0 10*3/uL (ref 0.0–0.2)
EOS (ABSOLUTE): 0.1 10*3/uL (ref 0.0–0.4)
Eos: 1 %
HEMOGLOBIN: 13.9 g/dL (ref 11.1–15.9)
Hematocrit: 43.2 % (ref 34.0–46.6)
IMMATURE GRANS (ABS): 0 10*3/uL (ref 0.0–0.1)
Immature Granulocytes: 0 %
LYMPHS ABS: 1.4 10*3/uL (ref 0.7–3.1)
LYMPHS: 33 %
MCH: 28.5 pg (ref 26.6–33.0)
MCHC: 32.2 g/dL (ref 31.5–35.7)
MCV: 89 fL (ref 79–97)
Monocytes Absolute: 0.5 10*3/uL (ref 0.1–0.9)
Monocytes: 11 %
NEUTROS ABS: 2.4 10*3/uL (ref 1.4–7.0)
Neutrophils: 54 %
PLATELETS: 195 10*3/uL (ref 150–379)
RBC: 4.88 x10E6/uL (ref 3.77–5.28)
RDW: 15.4 % (ref 12.3–15.4)
WBC: 4.4 10*3/uL (ref 3.4–10.8)

## 2016-08-24 LAB — HEPATIC FUNCTION PANEL
ALK PHOS: 77 IU/L (ref 39–117)
ALT: 14 IU/L (ref 0–32)
AST: 23 IU/L (ref 0–40)
Albumin: 3.8 g/dL (ref 3.5–5.5)
BILIRUBIN, DIRECT: 0.17 mg/dL (ref 0.00–0.40)
Bilirubin Total: 0.6 mg/dL (ref 0.0–1.2)
Total Protein: 6.2 g/dL (ref 6.0–8.5)

## 2016-08-24 LAB — CEA: CEA: 1.2 ng/mL (ref 0.0–4.7)

## 2016-08-24 NOTE — Telephone Encounter (Signed)
-----   Message from Christene Lye, MD sent at 08/24/2016  7:00 AM EDT ----- Inform pt labs are normal

## 2016-08-24 NOTE — Telephone Encounter (Signed)
Notified patient as instructed, patient pleased. Discussed follow-up appointments, patient agrees  

## 2017-02-10 ENCOUNTER — Ambulatory Visit: Payer: Medicaid Other | Admitting: General Surgery

## 2017-05-09 ENCOUNTER — Ambulatory Visit: Payer: Medicaid Other | Admitting: General Surgery

## 2017-05-11 ENCOUNTER — Encounter: Payer: Self-pay | Admitting: General Surgery

## 2017-05-11 ENCOUNTER — Ambulatory Visit: Payer: Medicaid Other | Admitting: General Surgery

## 2017-05-11 ENCOUNTER — Other Ambulatory Visit
Admission: RE | Admit: 2017-05-11 | Discharge: 2017-05-11 | Disposition: A | Discharge status: Home / Self Care | Payer: Medicaid Other | Source: Ambulatory Visit | Attending: General Surgery | Admitting: General Surgery

## 2017-05-11 VITALS — BP 120/70 | HR 84 | Resp 13 | Ht 66.0 in | Wt 173.0 lb

## 2017-05-11 DIAGNOSIS — C180 Malignant neoplasm of cecum: Secondary | ICD-10-CM | POA: Diagnosis not present

## 2017-05-11 LAB — COMPREHENSIVE METABOLIC PANEL
ALBUMIN: 3.9 g/dL (ref 3.5–5.0)
ALT: 15 U/L (ref 14–54)
ANION GAP: 7 (ref 5–15)
AST: 20 U/L (ref 15–41)
Alkaline Phosphatase: 79 U/L (ref 38–126)
BILIRUBIN TOTAL: 0.4 mg/dL (ref 0.3–1.2)
BUN: 9 mg/dL (ref 6–20)
CHLORIDE: 108 mmol/L (ref 101–111)
CO2: 27 mmol/L (ref 22–32)
Calcium: 9.2 mg/dL (ref 8.9–10.3)
Creatinine, Ser: 0.87 mg/dL (ref 0.44–1.00)
GFR calc Af Amer: >60 mL/min (ref >60)
GFR calc non Af Amer: >60 mL/min (ref >60)
GLUCOSE: 132 mg/dL — AB (ref 65–99)
POTASSIUM: 4.1 mmol/L (ref 3.5–5.1)
SODIUM: 142 mmol/L (ref 135–145)
TOTAL PROTEIN: 7.5 g/dL (ref 6.5–8.1)

## 2017-05-11 LAB — CBC WITH DIFFERENTIAL/PLATELET
BASOS ABS: 0 10*3/uL (ref 0–0.1)
Basophils Relative: 1 %
EOS PCT: 2 %
Eosinophils Absolute: 0.1 10*3/uL (ref 0–0.7)
HEMATOCRIT: 39 % (ref 35.0–47.0)
Hemoglobin: 12.8 g/dL (ref 12.0–16.0)
LYMPHS ABS: 1.4 10*3/uL (ref 1.0–3.6)
LYMPHS PCT: 38 %
MCH: 29.5 pg (ref 26.0–34.0)
MCHC: 32.8 g/dL (ref 32.0–36.0)
MCV: 90 fL (ref 80.0–100.0)
MONO ABS: 0.3 10*3/uL (ref 0.2–0.9)
MONOS PCT: 7 %
NEUTROS ABS: 1.9 10*3/uL (ref 1.4–6.5)
Neutrophils Relative %: 52 %
PLATELETS: 194 10*3/uL (ref 150–440)
RBC: 4.33 MIL/uL (ref 3.80–5.20)
RDW: 15 % — AB (ref 11.5–14.5)
WBC: 3.6 10*3/uL (ref 3.6–11.0)

## 2017-05-11 NOTE — Progress Notes (Signed)
Patient ID: Briana Snyder, female   DOB: 09-10-1965, 51 y.o.   MRN: [id number]  Chief Complaint  Patient presents with  . Follow-up    cancer of the cecum    HPI Briana Snyder is a 51 y.o. female here for follow up for cancer of the cecum. She had a laparoscopic right Hemi Colectomy on 02/25/15. She reports that she is doing well, but does have some odd feelings in the intestines at times, but no pain. She reports she has a bowel movement daily.  HPI  Past Medical History:  Diagnosis Date  . Anemia   . Anxiety   . Asthma   . Cancer ([place])    T2,   . GERD (gastroesophageal reflux disease)   . Headache     Past Surgical History:  Procedure Laterality Date  . BREAST BIOPSY Left 08/2010   benign  . BREAST LUMPECTOMY Left 2012  . COLONOSCOPY N/A 01/28/2015   Procedure: COLONOSCOPY;  Surgeon: Christene Lye, MD;  Location: [facility] END[facility];  Service: Endoscopy;  Laterality: N/A;  . endometrial polyps  08/2010   resected  . HERNIA REPAIR  2000   inguinal   . LAPAROSCOPIC RIGHT HEMI COLECTOMY Right 02/25/2015   Procedure: LAPAROSCOPIC RIGHT HEMI COLECTOMY;  Surgeon: Christene Lye, MD;  Location: [facility] ORS;  Service: General;  Laterality: Right;  . THYROIDECTOMY  1987   Right  . VARICOSE VEIN SURGERY      Family History  Problem Relation Age of Onset  . Breast cancer Mother 52  . Prostate cancer Father     Social History Social History   Tobacco Use  . Smoking status: Former Smoker    Packs/day: 0.50    Years: 18.00    Pack years: 9.00    Types: Cigarettes    Last attempt to quit: 05/25/2011    Years since quitting: 5.9  . Smokeless tobacco: Never Used  Substance Use Topics  . Alcohol use: Yes    Alcohol/week: 0.0 oz    Comment: pt states she drinks beer almost every day-pt unsure when asked how many cans of beer   . Drug use: No    No Known Allergies  Current Outpatient Medications  Medication Sig Dispense Refill  . busPIRone (BUSPAR) 10 MG tablet  Take 1 tablet by mouth 2 (two) times daily.  0  . fluticasone (FLONASE) 50 MCG/ACT nasal spray Place 1 spray into both nostrils as needed.     Marland Kitchen omeprazole (PRILOSEC) 40 MG capsule Take 40 mg by mouth as needed.   0  . Potassium Chloride CR (MICRO-K) 8 MEQ CPCR capsule CR Take 8 mEq by mouth daily.   0  . PROAIR HFA 108 (90 BASE) MCG/ACT inhaler Inhale 1 puff into the lungs every 4 (four) hours as needed.  0   No current facility-administered medications for this visit.     Review of Systems Review of Systems  Constitutional: Negative.   Respiratory: Negative.   Cardiovascular: Negative.   Gastrointestinal: Negative.     Blood pressure 120/70, pulse 84, resp. rate 13, height 5\' 6"  (1.676 m), weight 173 lb (78.5 kg).  Physical Exam Physical Exam  Constitutional: She is oriented to person, place, and time. She appears well-developed and well-nourished.  Eyes: Conjunctivae are normal. No scleral icterus.  Neck: Neck supple.  Cardiovascular: Normal rate, regular rhythm and normal heart sounds.  Pulmonary/Chest: Effort normal and breath sounds normal.  Abdominal: Soft. Bowel sounds are normal. There is no tenderness.  Lymphadenopathy:    She has no cervical adenopathy.  Neurological: She is alert and oriented to person, place, and time.  Skin: Skin is warm and dry.  Psychiatric: She has a normal mood and affect.    Data Reviewed Prior notes reviewed   Assessment   Stable exam,  2 years post right colectomy for CA of the cecum, T2 N0 M0      Plan    Patient to return for a colonoscopy with Dr. Bary Castilla in one year. The patient is aware to call back for any questions or concerns.  Requested some basic labs and CEA     HPI, Physical Exam, Assessment and Plan have been scribed under the direction and in the presence of Mckinley Jewel, MD  Concepcion Living, LPN  I have completed the exam and reviewed the above documentation for accuracy and completeness.  I agree with the  above.  Haematologist has been used and any errors in dictation or transcription are unintentional.  Seeplaputhur G. Jamal Collin, M.D., F.A.C.S.   Junie Panning G 05/12/2017, 10:44 AM

## 2017-05-11 NOTE — Patient Instructions (Addendum)
Patient to return for a colonoscopy with Dr. Bary Castilla in one year.  The patient is aware to call back for any questions or concerns.

## 2017-05-12 ENCOUNTER — Telehealth: Payer: Self-pay | Admitting: *Deleted

## 2017-05-12 LAB — CEA: CEA: 1.8 ng/mL (ref 0.0–4.7)

## 2017-05-12 NOTE — Telephone Encounter (Signed)
Notified patient as instructed, patient pleased. Discussed follow-up appointments, patient agrees  

## 2017-05-12 NOTE — Telephone Encounter (Signed)
-----   Message from Christene Lye, MD sent at 05/12/2017  9:46 AM EST ----- Please inform pt-all labs are OK

## 2017-07-19 ENCOUNTER — Ambulatory Visit: Payer: Medicaid Other | Admitting: Nurse Practitioner

## 2017-07-19 ENCOUNTER — Encounter: Payer: Self-pay | Admitting: Nurse Practitioner

## 2017-07-19 VITALS — BP 137/92 | HR 86 | Resp 16 | Ht 66.5 in | Wt 175.0 lb

## 2017-07-19 DIAGNOSIS — Z0001 Encounter for general adult medical examination with abnormal findings: Secondary | ICD-10-CM

## 2017-07-19 DIAGNOSIS — Z1231 Encounter for screening mammogram for malignant neoplasm of breast: Secondary | ICD-10-CM

## 2017-07-19 DIAGNOSIS — B3731 Acute candidiasis of vulva and vagina: Secondary | ICD-10-CM

## 2017-07-19 DIAGNOSIS — L209 Atopic dermatitis, unspecified: Secondary | ICD-10-CM

## 2017-07-19 DIAGNOSIS — G44019 Episodic cluster headache, not intractable: Secondary | ICD-10-CM

## 2017-07-19 DIAGNOSIS — F411 Generalized anxiety disorder: Secondary | ICD-10-CM | POA: Diagnosis not present

## 2017-07-19 DIAGNOSIS — J3089 Other allergic rhinitis: Secondary | ICD-10-CM | POA: Diagnosis not present

## 2017-07-19 DIAGNOSIS — B373 Candidiasis of vulva and vagina: Secondary | ICD-10-CM | POA: Diagnosis not present

## 2017-07-19 DIAGNOSIS — E559 Vitamin D deficiency, unspecified: Secondary | ICD-10-CM

## 2017-07-19 DIAGNOSIS — K219 Gastro-esophageal reflux disease without esophagitis: Secondary | ICD-10-CM

## 2017-07-19 DIAGNOSIS — R8789 Other abnormal findings in specimens from female genital organs: Secondary | ICD-10-CM

## 2017-07-19 DIAGNOSIS — R87618 Other abnormal cytological findings on specimens from cervix uteri: Secondary | ICD-10-CM

## 2017-07-19 DIAGNOSIS — J4520 Mild intermittent asthma, uncomplicated: Secondary | ICD-10-CM | POA: Diagnosis not present

## 2017-07-19 DIAGNOSIS — Z1239 Encounter for other screening for malignant neoplasm of breast: Secondary | ICD-10-CM

## 2017-07-19 MED ORDER — BUSPIRONE HCL 10 MG PO TABS: 10.0000 mg | ORAL_TABLET | Freq: Two times a day (BID) | ORAL | 3 refills | Status: DC

## 2017-07-19 MED ORDER — OMEPRAZOLE 40 MG PO CPDR: 40.0000 mg | DELAYED_RELEASE_CAPSULE | ORAL | 3 refills | Status: DC | PRN

## 2017-07-19 MED ORDER — MICONAZOLE NITRATE 1200 & 2 MG & % VA KIT: PACK | VAGINAL | 3 refills | Status: DC

## 2017-07-19 MED ORDER — TRIAMCINOLONE ACETONIDE 0.025 % EX CREA: TOPICAL_CREAM | Freq: Two times a day (BID) | CUTANEOUS | 3 refills | Status: DC

## 2017-07-19 MED ORDER — FLUTICASONE PROPIONATE 50 MCG/ACT NA SUSP: 1.0000 | NASAL | 3 refills | Status: DC | PRN

## 2017-07-19 MED ORDER — PROAIR HFA 108 (90 BASE) MCG/ACT IN AERS: 1.0000 | INHALATION_SPRAY | RESPIRATORY_TRACT | 3 refills | Status: DC | PRN

## 2017-07-19 MED ORDER — BUTALBITAL-APAP-CAFF-COD 50-325-40-30 MG PO CAPS: 1.0000 | ORAL_CAPSULE | Freq: Four times a day (QID) | ORAL | 3 refills | Status: DC | PRN

## 2017-07-19 NOTE — Progress Notes (Signed)
[facility] [place], [place] [address]  Internal MEDICINE  Office Visit Note  Patient Name: Briana Snyder  [id number]  [id number]  Date of Service: 08/07/2017  No chief complaint on file.   The patient is here for routine follow up exam. Today, she is c/o itchy rash on the upper and lower arms. Starting to spread across the chest. Rash described as itchy.  The patient is also c/o of vaginal itching and irritation, with moderate amount of white discharge which has no odor. This has been going on, intermittently, for babout 2 weeks. Did try OTC medication to treat yeast infection. Helped some, but symptoms came right back after she finished treatment.     Pt is here for routine follow up.    Current Medication: Outpatient Encounter Medications as of 07/19/2017  Medication Sig Note  . busPIRone (BUSPAR) 10 MG tablet Take 1 tablet (10 mg total) by mouth 2 (two) times daily.   . butalbital-acetaminophen-caffeine (FIORICET WITH CODEINE) 50-325-40-30 MG capsule Take 1 capsule by mouth every 6 (six) hours as needed for headache. Take 1 capsule(s) by mouth three times a day as needed for headache   . fluticasone (FLONASE) 50 MCG/ACT nasal spray Place 1 spray into both nostrils as needed.   . miconazole (MONISTAT 1 COMBINATION PACK) kit Use as directed for yeast infection.   Marland Kitchen omeprazole (PRILOSEC) 40 MG capsule Take 1 capsule (40 mg total) by mouth as needed.   Marland Kitchen PROAIR HFA 108 (90 Base) MCG/ACT inhaler Inhale 1 puff into the lungs every 4 (four) hours as needed.   . [DISCONTINUED] busPIRone (BUSPAR) 10 MG tablet Take 1 tablet by mouth 2 (two) times daily. 01/08/2015: Received from: External Pharmacy Received Sig:   . [DISCONTINUED] butalbital-acetaminophen-caffeine (FIORICET WITH CODEINE) 50-325-40-30 MG capsule 1 capsule. Take 1 capsule(s) by mouth three times a day as needed for headache   . [DISCONTINUED] fluticasone (FLONASE) 50 MCG/ACT nasal spray Place 1 spray  into both nostrils as needed.    . [DISCONTINUED] miconazole (MONISTAT 1 COMBINATION PACK) kit Place 1 each vaginally once. Apply 1 applicator full PV qhs x3 days   . [DISCONTINUED] omeprazole (PRILOSEC) 40 MG capsule Take 40 mg by mouth as needed.  01/08/2015: Received from: External Pharmacy  . [DISCONTINUED] PROAIR HFA 108 (90 BASE) MCG/ACT inhaler Inhale 1 puff into the lungs every 4 (four) hours as needed. 01/08/2015: Received from: External Pharmacy Received Sig:   . triamcinolone (KENALOG) 0.025 % cream Apply topically 2 (two) times daily.   . [DISCONTINUED] Potassium Chloride CR (MICRO-K) 8 MEQ CPCR capsule CR Take 8 mEq by mouth daily.  02/12/2016: Received from: External Pharmacy  . [DISCONTINUED] triamcinolone (KENALOG) 0.025 % cream     No facility-administered encounter medications on file as of 07/19/2017.     Surgical History: Past Surgical History:  Procedure Laterality Date  . BREAST BIOPSY Left 08/2010   benign  . BREAST LUMPECTOMY Left 2012  . COLONOSCOPY N/A 01/28/2015   Procedure: COLONOSCOPY;  Surgeon: Christene Lye, MD;  Location: [facility] END[facility]COPY;  Service: Endoscopy;  Laterality: N/A;  . endometrial polyps  08/2010   resected  . HERNIA REPAIR  2000   inguinal   . LAPAROSCOPIC RIGHT HEMI COLECTOMY Right 02/25/2015   Procedure: LAPAROSCOPIC RIGHT HEMI COLECTOMY;  Surgeon: Christene Lye, MD;  Location: [facility] ORS;  Service: General;  Laterality: Right;  . THYROIDECTOMY  1987   Right  . [place]  History: Past Medical History:  Diagnosis Date  . Anemia   . Anxiety   . Asthma   . Cancer ([place])    T2,   . GERD (gastroesophageal reflux disease)   . Headache     Family History: Family History  Problem Relation Age of Onset  . Breast cancer Mother 70  . Prostate cancer Father     Social History   Socioeconomic History  . Marital status: Divorced    Spouse name: Not on file  . Number of children: Not on file  . Years of  education: Not on file  . Highest education level: Not on file  Social Needs  . Financial resource strain: Not on file  . Food insecurity - worry: Not on file  . Food insecurity - inability: Not on file  . Transportation needs - medical: Not on file  . Transportation needs - non-medical: Not on file  Occupational History  . Not on file  Tobacco Use  . Smoking status: Former Smoker    Packs/day: 0.50    Years: 18.00    Pack years: 9.00    Types: Cigarettes    Last attempt to quit: 05/25/2011    Years since quitting: 6.2  . Smokeless tobacco: Never Used  Substance and Sexual Activity  . Alcohol use: Yes    Alcohol/week: 0.0 oz    Comment: pt states she drinks beer almost every day-pt unsure when asked how many cans of beer   . Drug use: No  . Sexual activity: Not on file  Other Topics Concern  . Not on file  Social History Narrative  . Not on file      Review of Systems  Constitutional: Negative for activity change, chills, fatigue and unexpected weight change.  HENT: Negative for congestion, postnasal drip, rhinorrhea, sneezing and sore throat.   Eyes: Negative.  Negative for redness.  Respiratory: Negative for cough, chest tightness, shortness of breath and wheezing.   Cardiovascular: Negative for chest pain and palpitations.  Gastrointestinal: Negative for abdominal pain, constipation, diarrhea, nausea and vomiting.  Endocrine: Negative for cold intolerance, heat intolerance, polydipsia, polyphagia and polyuria.  Genitourinary: Positive for vaginal discharge. Negative for dysuria and frequency.       Vaginal itching and irritation.   Musculoskeletal: Negative for arthralgias, back pain, joint swelling and neck pain.  Skin: Positive for rash.  Allergic/Immunologic: Negative for environmental allergies.  Neurological: Positive for headaches. Negative for tremors and numbness.  Hematological: Negative for adenopathy. Does not bruise/bleed easily.  Psychiatric/Behavioral:  Negative for behavioral problems (Depression), dysphoric mood, sleep disturbance and suicidal ideas. The patient is not nervous/anxious.     Today's Vitals   07/19/17 1553  BP: (!) 137/92  Pulse: 86  Resp: 16  SpO2: 99%  Weight: 175 lb (79.4 kg)  Height: 5' 6.5" (1.689 m)    Physical Exam  Constitutional: She is oriented to person, place, and time. She appears well-developed and well-nourished. No distress.  HENT:  Head: Normocephalic and atraumatic.  Mouth/Throat: Oropharynx is clear and moist. No oropharyngeal exudate.  Eyes: EOM are normal. Pupils are equal, round, and reactive to light.  Neck: Normal range of motion. Neck supple. No JVD present. No tracheal deviation present. No thyromegaly present.  Cardiovascular: Normal rate, regular rhythm and normal heart sounds. Exam reveals no gallop and no friction rub.  No murmur heard. Pulmonary/Chest: Effort normal and breath sounds normal. No respiratory distress. She has no wheezes. She has no rales. She exhibits no  tenderness.  Abdominal: Soft. Bowel sounds are normal. There is no tenderness.  Musculoskeletal: Normal range of motion.  Lymphadenopathy:    She has no cervical adenopathy.  Neurological: She is alert and oriented to person, place, and time. No cranial nerve deficit.  Skin: Skin is warm and dry. Rash noted. She is not diaphoretic.  Fine, red, raised rash, covering both arms and across the chest. Localized irritation present, no evidence of infecttion.  Psychiatric: She has a normal mood and affect. Her behavior is normal. Judgment and thought content normal.  Nursing note and vitals reviewed.   Assessment/Plan: 1. Atopic dermatitis, mild - triamcinolone (KENALOG) 0.025 % cream; Apply topically 2 (two) times daily.  Dispense: 80 g; Refill: 3  2. Vaginal candidiasis - miconazole (MONISTAT 1 COMBINATION PACK) kit; Use as directed for yeast infection.  Dispense: 1 each; Refill: 3  3. Episodic cluster headache, not  intractable - butalbital-acetaminophen-caffeine (FIORICET WITH CODEINE) 50-325-40-30 MG capsule; Take 1 capsule by mouth every 6 (six) hours as needed for headache. Take 1 capsule(s) by mouth three times a day as needed for headache  Dispense: 60 capsule; Refill: 3  4. Mild intermittent asthma, unspecified whether complicated - PROAIR HFA 108 (90 Base) MCG/ACT inhaler; Inhale 1 puff into the lungs every 4 (four) hours as needed.  Dispense: 1 Inhaler; Refill: 3  5. Screening for breast cancer - MM DIGITAL SCREENING BILATERAL; Future  6. 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 Allergic rhinitis due to other allergic trigger, unspecified seasonality - fluticasone (FLONASE) 50 MCG/ACT nasal spray; Place 1 spray into both nostrils as needed.  Dispense: 16 g; Refill: 3  8. Gastroesophageal reflux disease without esophagitis - omeprazole (PRILOSEC) 40 MG capsule; Take 1 capsule (40 mg total) by mouth as needed.  Dispense: 30 capsule; Refill: 3  9. GAD (generalized anxiety disorder) - busPIRone (BUSPAR) 10 MG tablet; Take 1 tablet (10 mg total) by mouth 2 (two) times daily.  Dispense: 60 tablet; Refill: 3  10. Encounter for general adult medical examination with abnormal findings Routine, fasting labs ordered.  - CBC with Differential/Platelet - Comprehensive metabolic panel - T4, free - TSH - Lipid panel  11. Vitamin D deficiency - Vitamin D 1,25 dihydroxy  General Counseling: Jennetta verbalizes understanding of the findings of todays visit and agrees with plan of treatment. I have discussed any further diagnostic evaluation that may be needed or ordered today. We also reviewed her medications today. she has been encouraged to call the office with any questions or concerns that should arise related to todays visit.  This patient was seen by Leretha Pol, FNP- C in Collaboration with Dr Lavera Guise as a  part of collaborative care agreement    Orders Placed This Encounter  Procedures  . MM DIGITAL SCREENING BILATERAL  . CBC with Differential/Platelet  . Comprehensive metabolic panel  . T4, free  . TSH  . Lipid panel  . Vitamin D 1,25 dihydroxy  . Ambulatory referral to Gynecology    Meds ordered this encounter  Medications  . miconazole (MONISTAT 1 COMBINATION PACK) kit    Sig: Use as directed for yeast infection.    Dispense:  1 each    Refill:  3    Order Specific Question:   Supervising Provider    Answer:   Lavera Guise [[id number]]  . triamcinolone (KENALOG) 0.025 % cream    Sig: Apply topically 2 (two)  times daily.    Dispense:  80 g    Refill:  3    Order Specific Question:   Supervising Provider    Answer:   Lavera Guise [[id number]]  . busPIRone (BUSPAR) 10 MG tablet    Sig: Take 1 tablet (10 mg total) by mouth 2 (two) times daily.    Dispense:  60 tablet    Refill:  3    Order Specific Question:   Supervising Provider    Answer:   Lavera Guise [[id number]]  . butalbital-acetaminophen-caffeine (FIORICET WITH CODEINE) 50-325-40-30 MG capsule    Sig: Take 1 capsule by mouth every 6 (six) hours as needed for headache. Take 1 capsule(s) by mouth three times a day as needed for headache    Dispense:  60 capsule    Refill:  3    Order Specific Question:   Supervising Provider    Answer:   Lavera Guise [place]  . omeprazole (PRILOSEC) 40 MG capsule    Sig: Take 1 capsule (40 mg total) by mouth as needed.    Dispense:  30 capsule    Refill:  3    Order Specific Question:   Supervising Provider    Answer:   Lavera Guise [[id number]]  . PROAIR HFA 108 (90 Base) MCG/ACT inhaler    Sig: Inhale 1 puff into the lungs every 4 (four) hours as needed.    Dispense:  1 Inhaler    Refill:  3    Order Specific Question:   Supervising Provider    Answer:   Lavera Guise [[id number]]  . fluticasone (FLONASE) 50 MCG/ACT nasal spray    Sig: Place 1 spray into both nostrils as needed.    Dispense:  16 g     Refill:  3    Order Specific Question:   Supervising Provider    Answer:   Lavera Guise [[id number]]    Time spent: 26  Minutes   Dr Lavera Guise Internal medicine

## 2017-08-01 LAB — CBC WITH DIFFERENTIAL/PLATELET
BASOS: 1 %
Basophils Absolute: 0 10*3/uL (ref 0.0–0.2)
EOS (ABSOLUTE): 0.1 10*3/uL (ref 0.0–0.4)
EOS: 2 %
HEMATOCRIT: 38.6 % (ref 34.0–46.6)
HEMOGLOBIN: 12.9 g/dL (ref 11.1–15.9)
IMMATURE GRANULOCYTES: 0 %
Immature Grans (Abs): 0 10*3/uL (ref 0.0–0.1)
LYMPHS ABS: 1 10*3/uL (ref 0.7–3.1)
Lymphs: 38 %
MCH: 29.2 pg (ref 26.6–33.0)
MCHC: 33.4 g/dL (ref 31.5–35.7)
MCV: 87 fL (ref 79–97)
MONOS ABS: 0.2 10*3/uL (ref 0.1–0.9)
Monocytes: 8 %
Neutrophils Absolute: 1.3 10*3/uL — ABNORMAL LOW (ref 1.4–7.0)
Neutrophils: 51 %
Platelets: 208 10*3/uL (ref 150–379)
RBC: 4.42 x10E6/uL (ref 3.77–5.28)
RDW: 14.7 % (ref 12.3–15.4)
WBC: 2.6 10*3/uL — ABNORMAL LOW (ref 3.4–10.8)

## 2017-08-01 LAB — LIPID PANEL
CHOL/HDL RATIO: 3.6 ratio (ref 0.0–4.4)
Cholesterol, Total: 230 mg/dL — ABNORMAL HIGH (ref 100–199)
HDL: 64 mg/dL (ref >39)
LDL Calculated: 95 mg/dL (ref 0–99)
Triglycerides: 355 mg/dL — ABNORMAL HIGH (ref 0–149)
VLDL CHOLESTEROL CAL: 71 mg/dL — AB (ref 5–40)

## 2017-08-01 LAB — COMPREHENSIVE METABOLIC PANEL
A/G RATIO: 1.3 (ref 1.2–2.2)
ALBUMIN: 4.3 g/dL (ref 3.5–5.5)
ALT: 14 IU/L (ref 0–32)
AST: 17 IU/L (ref 0–40)
Alkaline Phosphatase: 88 IU/L (ref 39–117)
BUN / CREAT RATIO: 13 (ref 9–23)
BUN: 12 mg/dL (ref 6–24)
Bilirubin Total: 0.3 mg/dL (ref 0.0–1.2)
CALCIUM: 9.2 mg/dL (ref 8.7–10.2)
CO2: 24 mmol/L (ref 20–29)
Chloride: 106 mmol/L (ref 96–106)
Creatinine, Ser: 0.95 mg/dL (ref 0.57–1.00)
GFR calc Af Amer: 79 mL/min/{1.73_m2} (ref >59)
GFR, EST NON AFRICAN AMERICAN: 69 mL/min/{1.73_m2} (ref >59)
GLOBULIN, TOTAL: 3.2 g/dL (ref 1.5–4.5)
Glucose: 94 mg/dL (ref 65–99)
POTASSIUM: 5 mmol/L (ref 3.5–5.2)
SODIUM: 145 mmol/L — AB (ref 134–144)
Total Protein: 7.5 g/dL (ref 6.0–8.5)

## 2017-08-01 LAB — TSH: TSH: 1.32 u[IU]/mL (ref 0.450–4.500)

## 2017-08-01 LAB — VITAMIN D 1,25 DIHYDROXY
VITAMIN D 1, 25 (OH) TOTAL: 34 pg/mL
VITAMIN D3 1, 25 (OH): 26 pg/mL
Vitamin D2 1, 25 (OH)2: <10 pg/mL

## 2017-08-01 LAB — T4, FREE: FREE T4: 1.08 ng/dL (ref 0.82–1.77)

## 2017-08-07 DIAGNOSIS — R8789 Other abnormal findings in specimens from female genital organs: Secondary | ICD-10-CM | POA: Insufficient documentation

## 2017-08-07 DIAGNOSIS — L209 Atopic dermatitis, unspecified: Secondary | ICD-10-CM | POA: Insufficient documentation

## 2017-08-07 DIAGNOSIS — Z0001 Encounter for general adult medical examination with abnormal findings: Secondary | ICD-10-CM | POA: Insufficient documentation

## 2017-08-07 DIAGNOSIS — F411 Generalized anxiety disorder: Secondary | ICD-10-CM | POA: Insufficient documentation

## 2017-08-07 DIAGNOSIS — E559 Vitamin D deficiency, unspecified: Secondary | ICD-10-CM | POA: Insufficient documentation

## 2017-08-07 DIAGNOSIS — B373 Candidiasis of vulva and vagina: Secondary | ICD-10-CM | POA: Insufficient documentation

## 2017-08-07 DIAGNOSIS — B3731 Acute candidiasis of vulva and vagina: Secondary | ICD-10-CM | POA: Insufficient documentation

## 2017-08-07 DIAGNOSIS — R87618 Other abnormal cytological findings on specimens from cervix uteri: Secondary | ICD-10-CM | POA: Insufficient documentation

## 2017-08-07 DIAGNOSIS — J309 Allergic rhinitis, unspecified: Secondary | ICD-10-CM | POA: Insufficient documentation

## 2017-08-07 DIAGNOSIS — J4520 Mild intermittent asthma, uncomplicated: Secondary | ICD-10-CM | POA: Insufficient documentation

## 2017-08-07 DIAGNOSIS — K219 Gastro-esophageal reflux disease without esophagitis: Secondary | ICD-10-CM | POA: Insufficient documentation

## 2017-08-07 DIAGNOSIS — G44019 Episodic cluster headache, not intractable: Secondary | ICD-10-CM | POA: Insufficient documentation

## 2017-08-23 ENCOUNTER — Encounter: Payer: Self-pay | Admitting: Radiology

## 2017-08-23 ENCOUNTER — Ambulatory Visit
Admission: RE | Admit: 2017-08-23 | Discharge: 2017-08-23 | Disposition: A | Discharge status: Home / Self Care | Payer: Medicaid Other | Source: Ambulatory Visit | Attending: Nurse Practitioner | Admitting: Nurse Practitioner

## 2017-08-23 DIAGNOSIS — Z1231 Encounter for screening mammogram for malignant neoplasm of breast: Secondary | ICD-10-CM | POA: Diagnosis present

## 2017-08-23 DIAGNOSIS — Z1239 Encounter for other screening for malignant neoplasm of breast: Secondary | ICD-10-CM

## 2018-05-30 ENCOUNTER — Ambulatory Visit: Payer: Medicaid Other | Admitting: General Surgery

## 2018-07-31 ENCOUNTER — Telehealth: Payer: Self-pay

## 2018-07-31 NOTE — Telephone Encounter (Signed)
lmom pt need appt for refills her med asap

## 2018-08-02 ENCOUNTER — Encounter: Payer: Self-pay | Admitting: Adult Health

## 2018-08-02 ENCOUNTER — Other Ambulatory Visit: Payer: Self-pay

## 2018-08-02 ENCOUNTER — Ambulatory Visit: Payer: 59 | Admitting: Adult Health

## 2018-08-02 VITALS — BP 128/89 | HR 95 | Resp 16 | Ht 66.0 in | Wt 169.0 lb

## 2018-08-02 DIAGNOSIS — K219 Gastro-esophageal reflux disease without esophagitis: Secondary | ICD-10-CM

## 2018-08-02 DIAGNOSIS — L209 Atopic dermatitis, unspecified: Secondary | ICD-10-CM

## 2018-08-02 DIAGNOSIS — J4520 Mild intermittent asthma, uncomplicated: Secondary | ICD-10-CM | POA: Diagnosis not present

## 2018-08-02 DIAGNOSIS — F411 Generalized anxiety disorder: Secondary | ICD-10-CM

## 2018-08-02 DIAGNOSIS — G44019 Episodic cluster headache, not intractable: Secondary | ICD-10-CM

## 2018-08-02 MED ORDER — TRIAMCINOLONE ACETONIDE 0.025 % EX OINT: 1.0000 "application " | TOPICAL_OINTMENT | Freq: Two times a day (BID) | CUTANEOUS | 0 refills | Status: DC

## 2018-08-02 MED ORDER — BUTALBITAL-APAP-CAFF-COD 50-325-40-30 MG PO CAPS: 1.0000 | ORAL_CAPSULE | Freq: Four times a day (QID) | ORAL | 1 refills | Status: DC | PRN

## 2018-08-02 MED ORDER — BUSPIRONE HCL 10 MG PO TABS: 10.0000 mg | ORAL_TABLET | Freq: Two times a day (BID) | ORAL | 3 refills | Status: DC

## 2018-08-02 MED ORDER — OMEPRAZOLE 40 MG PO CPDR: 40.0000 mg | DELAYED_RELEASE_CAPSULE | ORAL | 3 refills | Status: DC | PRN

## 2018-08-02 MED ORDER — PROAIR HFA 108 (90 BASE) MCG/ACT IN AERS: 1.0000 | INHALATION_SPRAY | RESPIRATORY_TRACT | 3 refills | Status: DC | PRN

## 2018-08-02 NOTE — Patient Instructions (Signed)

## 2018-08-02 NOTE — Progress Notes (Signed)
[facility] [place], [place] [address]  Internal MEDICINE  Office Visit Note  Patient Name: Briana Snyder  [id number]  [id number]  Date of Service: 08/05/2018  Chief Complaint  Patient presents with  . Migraine    medication refills   . Quality Metric Gaps    pap smear, yearly physical   . Gastroesophageal Reflux    HPI  Pt is here for follow up on Medications. Pt has a history of Migraines and GERD.  She denies any recent issues. She is here mostly because she has not been seen in over a year, and she is in need of refills. All of her medicine will be refilled at this visit.  She denies chest pain, sob, palpitations or other issues at this time.      Current Medication: Outpatient Encounter Medications as of 08/02/2018  Medication Sig  . busPIRone (BUSPAR) 10 MG tablet Take 1 tablet (10 mg total) by mouth 2 (two) times daily.  . fluticasone (FLONASE) 50 MCG/ACT nasal spray Place 1 spray into both nostrils as needed.  Marland Kitchen omeprazole (PRILOSEC) 40 MG capsule Take 1 capsule (40 mg total) by mouth as needed.  Marland Kitchen PROAIR HFA 108 (90 Base) MCG/ACT inhaler Inhale 1 puff into the lungs every 4 (four) hours as needed.  . [DISCONTINUED] busPIRone (BUSPAR) 10 MG tablet Take 1 tablet (10 mg total) by mouth 2 (two) times daily.  . [DISCONTINUED] butalbital-acetaminophen-caffeine (FIORICET WITH CODEINE) 50-325-40-30 MG capsule Take 1 capsule by mouth every 6 (six) hours as needed for headache. Take 1 capsule(s) by mouth three times a day as needed for headache  . [DISCONTINUED] butalbital-acetaminophen-caffeine (FIORICET WITH CODEINE) 50-325-40-30 MG capsule Take 1 capsule by mouth every 6 (six) hours as needed for headache. Take 1 capsule(s) by mouth three times a day as needed for headache  . [DISCONTINUED] omeprazole (PRILOSEC) 40 MG capsule Take 1 capsule (40 mg total) by mouth as needed.  . [DISCONTINUED] PROAIR HFA 108 (90 Base) MCG/ACT inhaler Inhale 1 puff into the  lungs every 4 (four) hours as needed.  . [DISCONTINUED] triamcinolone (KENALOG) 0.025 % cream Apply topically 2 (two) times daily.  Marland Kitchen triamcinolone (KENALOG) 0.025 % ointment Apply 1 application topically 2 (two) times daily.  . [DISCONTINUED] miconazole (MONISTAT 1 COMBINATION PACK) kit Use as directed for yeast infection. (Patient not taking: Reported on 08/02/2018)   No facility-administered encounter medications on file as of 08/02/2018.     Surgical History: Past Surgical History:  Procedure Laterality Date  . BREAST BIOPSY Left 08/2010   FIBROADENOMA, 1.0 CM.   Marland Kitchen BREAST LUMPECTOMY Left 2012  . COLONOSCOPY N/A 01/28/2015   Procedure: COLONOSCOPY;  Surgeon: Christene Lye, MD;  Location: [facility] ENDOSCOPY;  Service: Endoscopy;  Laterality: N/A;  . endometrial polyps  08/2010   resected  . HERNIA REPAIR  2000   inguinal   . LAPAROSCOPIC RIGHT HEMI COLECTOMY Right 02/25/2015   Procedure: LAPAROSCOPIC RIGHT HEMI COLECTOMY;  Surgeon: Christene Lye, MD;  Location: [facility] ORS;  Service: General;  Laterality: Right;  . THYROIDECTOMY  1987   Right  . VARICOSE VEIN SURGERY      Medical History: Past Medical History:  Diagnosis Date  . Anemia   . Anxiety   . Asthma   . Cancer ([place])    T2,   . GERD (gastroesophageal reflux disease)   . Headache     Family History: Family History  Problem Relation Age of Onset  . Breast cancer Mother  42  . Prostate cancer Father     Social History   Socioeconomic History  . Marital status: Divorced    Spouse name: Not on file  . Number of children: Not on file  . Years of education: Not on file  . Highest education level: Not on file  Occupational History  . Not on file  Social Needs  . Financial resource strain: Not on file  . Food insecurity:    Worry: Not on file    Inability: Not on file  . Transportation needs:    Medical: Not on file    Non-medical: Not on file  Tobacco Use  . Smoking status: Former Smoker     Packs/day: 0.50    Years: 18.00    Pack years: 9.00    Types: Cigarettes    Last attempt to quit: 05/25/2011    Years since quitting: 7.2  . Smokeless tobacco: Never Used  Substance and Sexual Activity  . Alcohol use: Yes    Alcohol/week: 0.0 standard drinks    Comment: pt states she drinks beer almost every day-pt unsure when asked how many cans of beer   . Drug use: No  . Sexual activity: Not on file  Lifestyle  . Physical activity:    Days per week: Not on file    Minutes per session: Not on file  . Stress: Not on file  Relationships  . Social connections:    Talks on phone: Not on file    Gets together: Not on file    Attends religious service: Not on file    Active member of club or organization: Not on file    Attends meetings of clubs or organizations: Not on file    Relationship status: Not on file  . Intimate partner violence:    Fear of current or ex partner: Not on file    Emotionally abused: Not on file    Physically abused: Not on file    Forced sexual activity: Not on file  Other Topics Concern  . Not on file  Social History Narrative  . Not on file      Review of Systems  Constitutional: Negative for chills, fatigue and unexpected weight change.  HENT: Negative for congestion, rhinorrhea, sneezing and sore throat.   Eyes: Negative for photophobia, pain and redness.  Respiratory: Negative for cough, chest tightness and shortness of breath.   Cardiovascular: Negative for chest pain and palpitations.  Gastrointestinal: Negative for abdominal pain, constipation, diarrhea, nausea and vomiting.  Endocrine: Negative.   Genitourinary: Negative for dysuria and frequency.  Musculoskeletal: Negative for arthralgias, back pain, joint swelling and neck pain.  Skin: Negative for rash.  Allergic/Immunologic: Negative.   Neurological: Negative for tremors and numbness.  Hematological: Negative for adenopathy. Does not bruise/bleed easily.  Psychiatric/Behavioral:  Negative for behavioral problems and sleep disturbance. The patient is not nervous/anxious.     Vital Signs: BP 128/89   Pulse 95   Resp 16   Ht 5' 6"  (1.676 m)   Wt 169 lb (76.7 kg)   LMP 02/08/2015 Comment: u preg negative  SpO2 98%   BMI 27.28 kg/m    Physical Exam Vitals signs and nursing note reviewed.  Constitutional:      General: She is not in acute distress.    Appearance: She is well-developed. She is not diaphoretic.  HENT:     Head: Normocephalic and atraumatic.     Mouth/Throat:     Pharynx: No oropharyngeal exudate.  Eyes:     Pupils: Pupils are equal, round, and reactive to light.  Neck:     Musculoskeletal: Normal range of motion and neck supple.     Thyroid: No thyromegaly.     Vascular: No JVD.     Trachea: No tracheal deviation.  Cardiovascular:     Rate and Rhythm: Normal rate and regular rhythm.     Heart sounds: Normal heart sounds. No murmur. No friction rub. No gallop.   Pulmonary:     Effort: Pulmonary effort is normal. No respiratory distress.     Breath sounds: Normal breath sounds. No wheezing or rales.  Chest:     Chest wall: No tenderness.  Abdominal:     Palpations: Abdomen is soft.     Tenderness: There is no abdominal tenderness. There is no guarding.  Musculoskeletal: Normal range of motion.  Lymphadenopathy:     Cervical: No cervical adenopathy.  Skin:    General: Skin is warm and dry.  Neurological:     Mental Status: She is alert and oriented to person, place, and time.     Cranial Nerves: No cranial nerve deficit.  Psychiatric:        Behavior: Behavior normal.        Thought Content: Thought content normal.        Judgment: Judgment normal.    Assessment/Plan: 1. GAD (generalized anxiety disorder) Stable, patient currently using BuSpar with no side effects and has good control over her anxiety. - busPIRone (BUSPAR) 10 MG tablet; Take 1 tablet (10 mg total) by mouth 2 (two) times daily.  Dispense: 60 tablet; Refill:  3  2. Mild intermittent asthma, unspecified whether complicated Refilled patient's pro-air.  Patient reports her asthma is well controlled and her previous pro-air has expired without incompletely used.  She denies any exacerbations or hospitalizations. - PROAIR HFA 108 (90 Base) MCG/ACT inhaler; Inhale 1 puff into the lungs every 4 (four) hours as needed.  Dispense: 1 Inhaler; Refill: 3  3. Gastroesophageal reflux disease without esophagitis Stable, continue Prilosec as discussed. - omeprazole (PRILOSEC) 40 MG capsule; Take 1 capsule (40 mg total) by mouth as needed.  Dispense: 30 capsule; Refill: 3  4. Atopic dermatitis, mild Refilled patient's Kenalog for atopic dermatitis. - triamcinolone (KENALOG) 0.025 % ointment; Apply 1 application topically 2 (two) times daily.  Dispense: 30 g; Refill: 0  5. Episodic cluster headache, not intractable Refill patient's Fioricet for intermittent headaches. - butalbital-acetaminophen-caffeine (FIORICET WITH CODEINE) 50-325-40-30 MG capsule; Take 1 capsule by mouth every 6 (six) hours as needed for headache. Take 1 capsule(s) by mouth three times a day as needed for headache  Dispense: 60 capsule; Refill: 1  General Counseling: Nafeesah verbalizes understanding of the findings of todays visit and agrees with plan of treatment. I have discussed any further diagnostic evaluation that may be needed or ordered today. We also reviewed her medications today. she has been encouraged to call the office with any questions or concerns that should arise related to todays visit.    No orders of the defined types were placed in this encounter.   Meds ordered this encounter  Medications  . busPIRone (BUSPAR) 10 MG tablet    Sig: Take 1 tablet (10 mg total) by mouth 2 (two) times daily.    Dispense:  60 tablet    Refill:  3  . triamcinolone (KENALOG) 0.025 % ointment    Sig: Apply 1 application topically 2 (two) times daily.    Dispense:  30 g    Refill:  0  .  PROAIR HFA 108 (90 Base) MCG/ACT inhaler    Sig: Inhale 1 puff into the lungs every 4 (four) hours as needed.    Dispense:  1 Inhaler    Refill:  3  . omeprazole (PRILOSEC) 40 MG capsule    Sig: Take 1 capsule (40 mg total) by mouth as needed.    Dispense:  30 capsule    Refill:  3  . DISCONTD: butalbital-acetaminophen-caffeine (FIORICET WITH CODEINE) 50-325-40-30 MG capsule    Sig: Take 1 capsule by mouth every 6 (six) hours as needed for headache. Take 1 capsule(s) by mouth three times a day as needed for headache    Dispense:  60 capsule    Refill:  1    Time spent: 25 Minutes   This patient was seen by Orson Gear AGNP-C in Collaboration with Dr Lavera Guise as a part of collaborative care agreement     Kendell Bane AGNP-C Internal medicine

## 2018-08-03 ENCOUNTER — Other Ambulatory Visit: Payer: Self-pay

## 2018-08-03 DIAGNOSIS — G44019 Episodic cluster headache, not intractable: Secondary | ICD-10-CM

## 2018-08-03 MED ORDER — BUTALBITAL-APAP-CAFF-COD 50-325-40-30 MG PO CAPS: ORAL_CAPSULE | ORAL | 1 refills | Status: DC

## 2018-12-12 ENCOUNTER — Encounter: Payer: Self-pay | Admitting: General Surgery

## 2019-01-02 ENCOUNTER — Ambulatory Visit (LOCAL_COMMUNITY_HEALTH_CENTER): Payer: Medicaid Other

## 2019-01-02 ENCOUNTER — Other Ambulatory Visit: Payer: Self-pay

## 2019-01-02 DIAGNOSIS — Z111 Encounter for screening for respiratory tuberculosis: Secondary | ICD-10-CM

## 2019-01-05 ENCOUNTER — Other Ambulatory Visit: Payer: Self-pay

## 2019-01-05 ENCOUNTER — Ambulatory Visit (LOCAL_COMMUNITY_HEALTH_CENTER): Payer: Self-pay

## 2019-01-05 DIAGNOSIS — Z111 Encounter for screening for respiratory tuberculosis: Secondary | ICD-10-CM

## 2019-01-05 LAB — TB SKIN TEST
Induration: 0 mm
TB Skin Test: NEGATIVE

## 2019-02-26 ENCOUNTER — Ambulatory Visit: Payer: Self-pay | Admitting: Surgery

## 2019-04-13 IMAGING — MG MM DIGITAL SCREENING BILAT W/ CAD
4 series · 4 of 4 positions shown · non-contrast
Comparison: Previous exam(s).

CLINICAL DATA: Screening.

EXAM:
DIGITAL SCREENING BILATERAL MAMMOGRAM WITH CAD

[R CC]
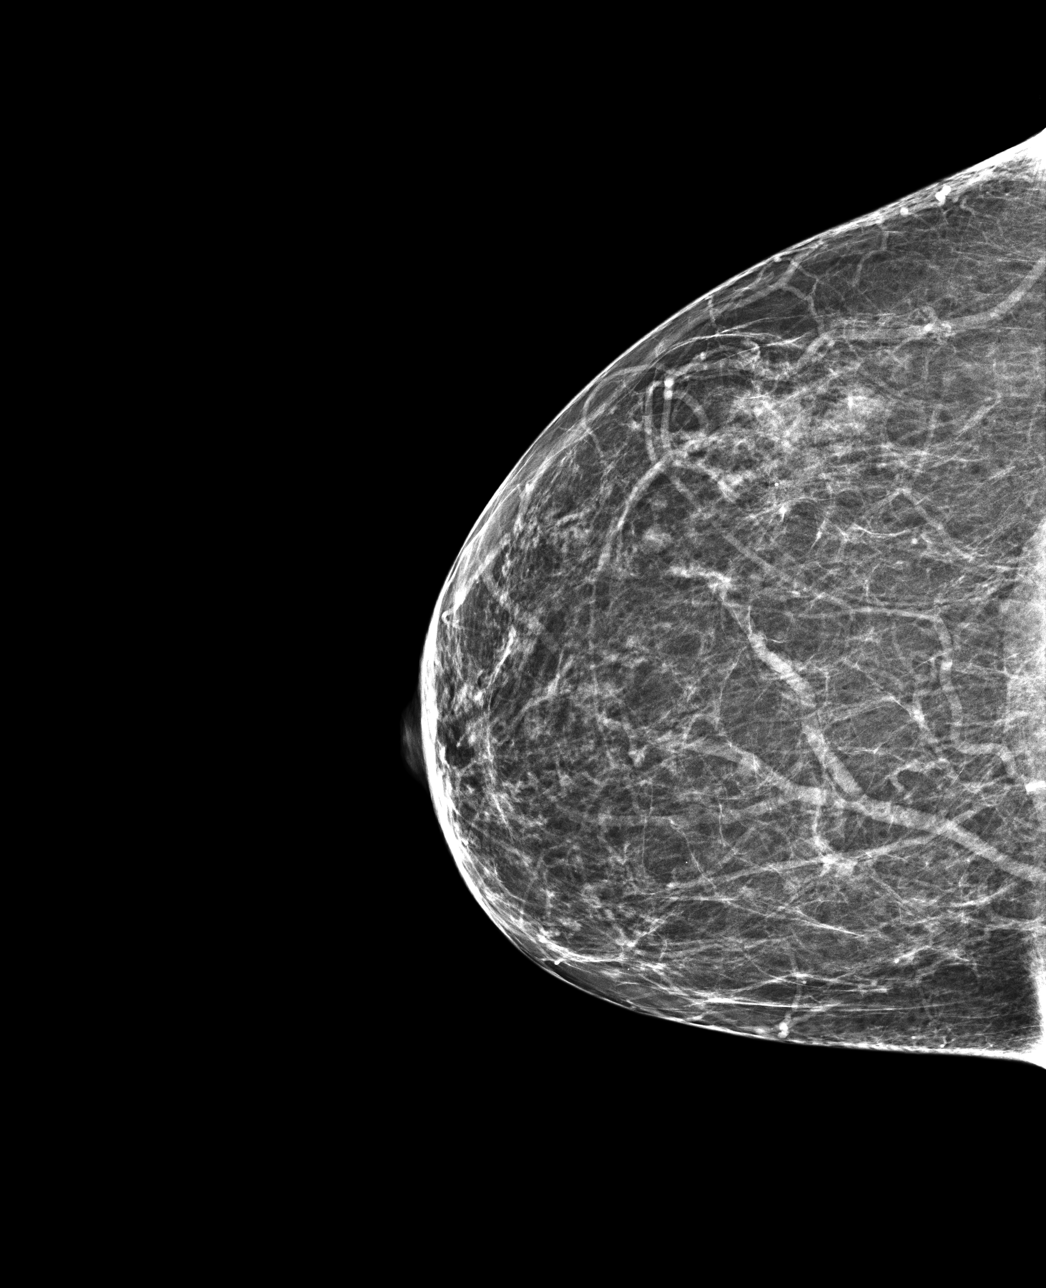

[L MLO]
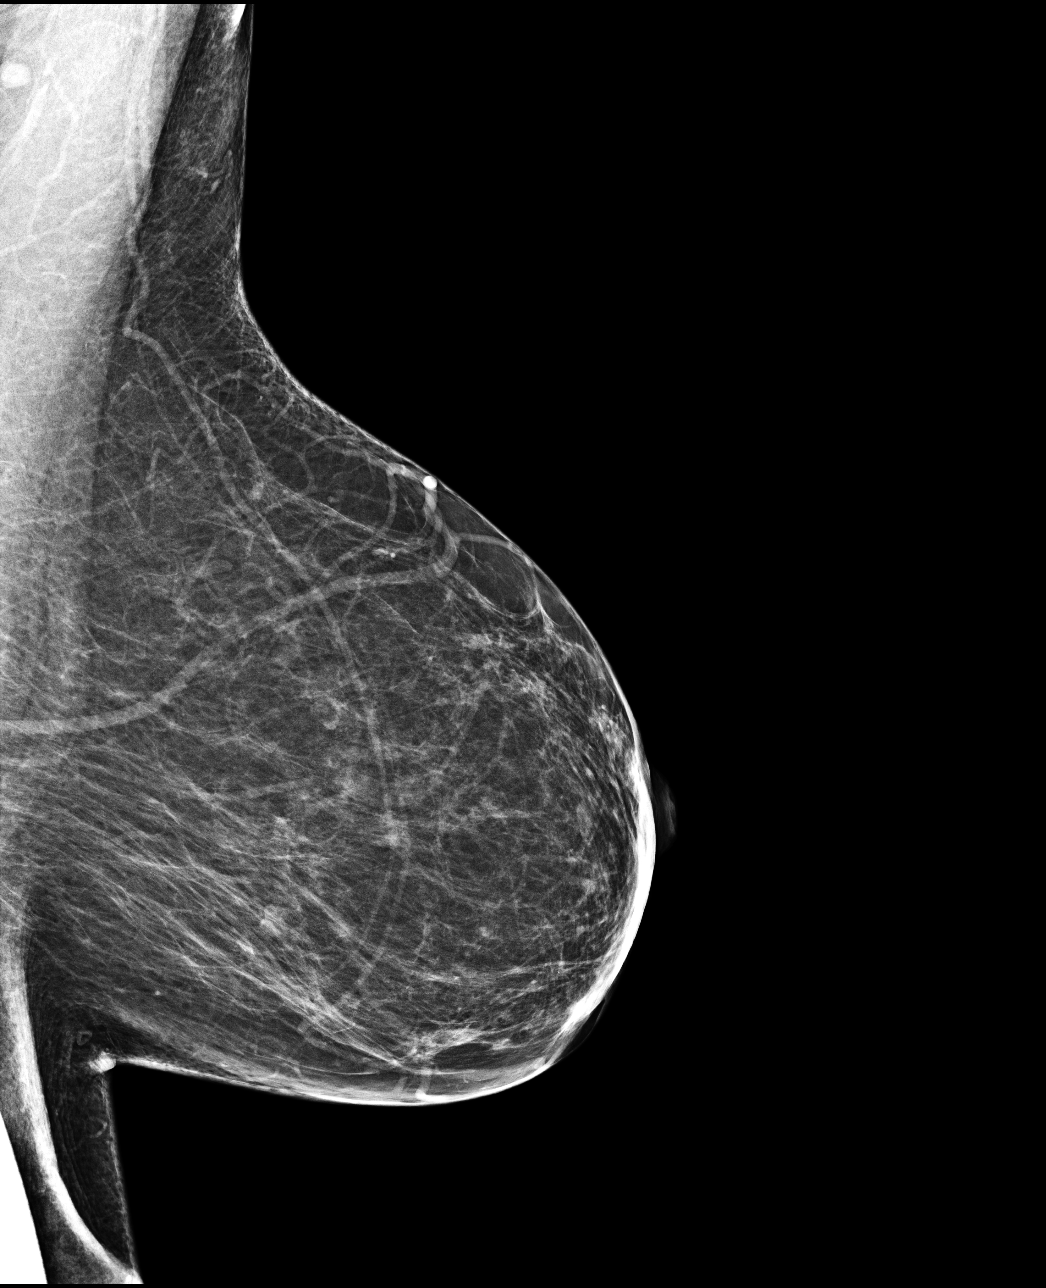

[L CC]
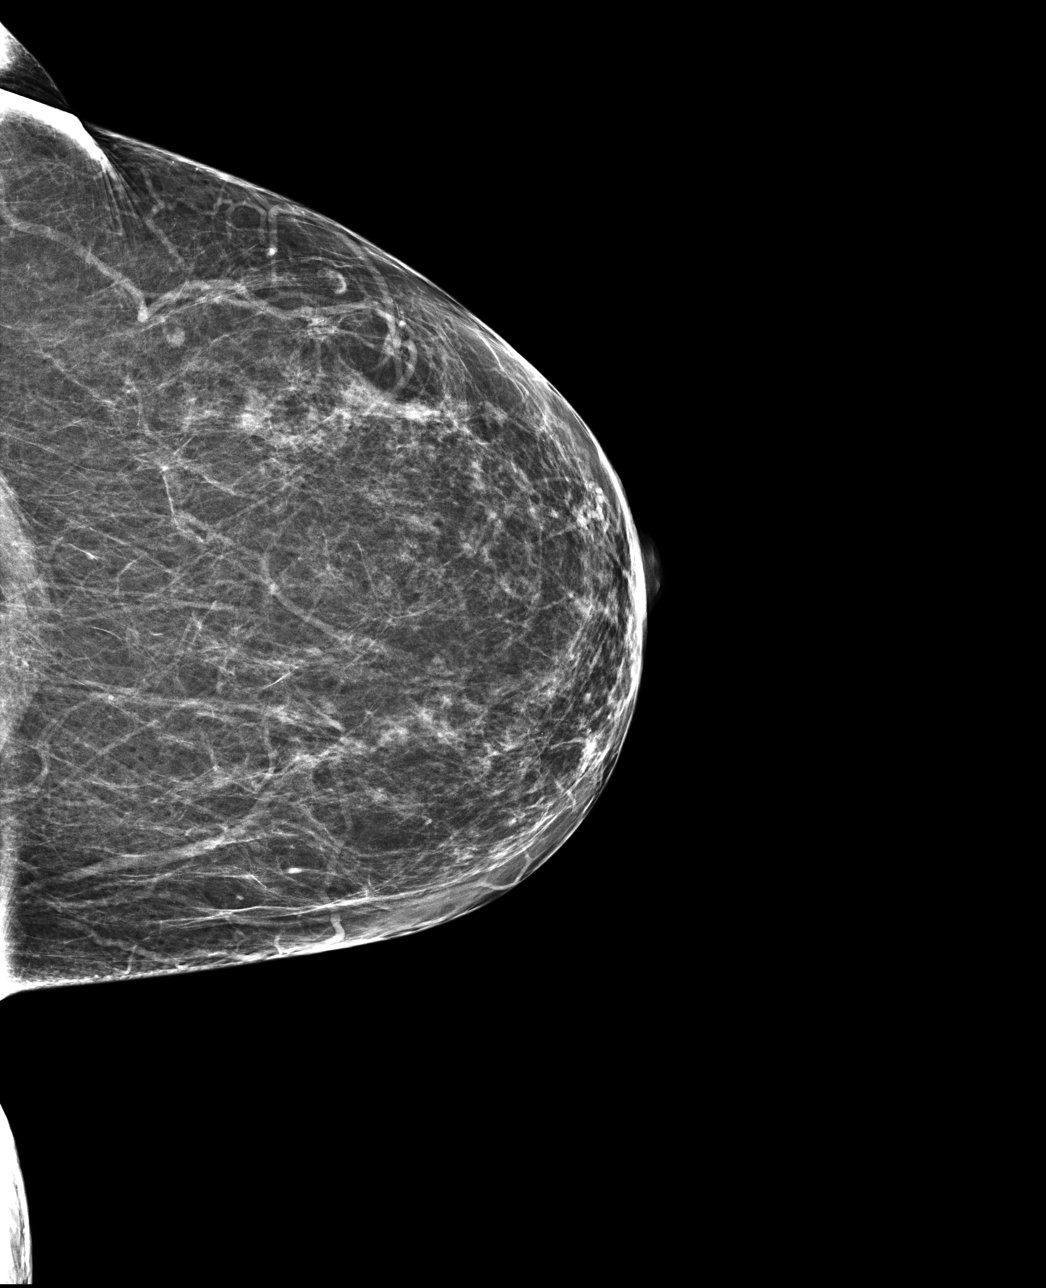

[R MLO]
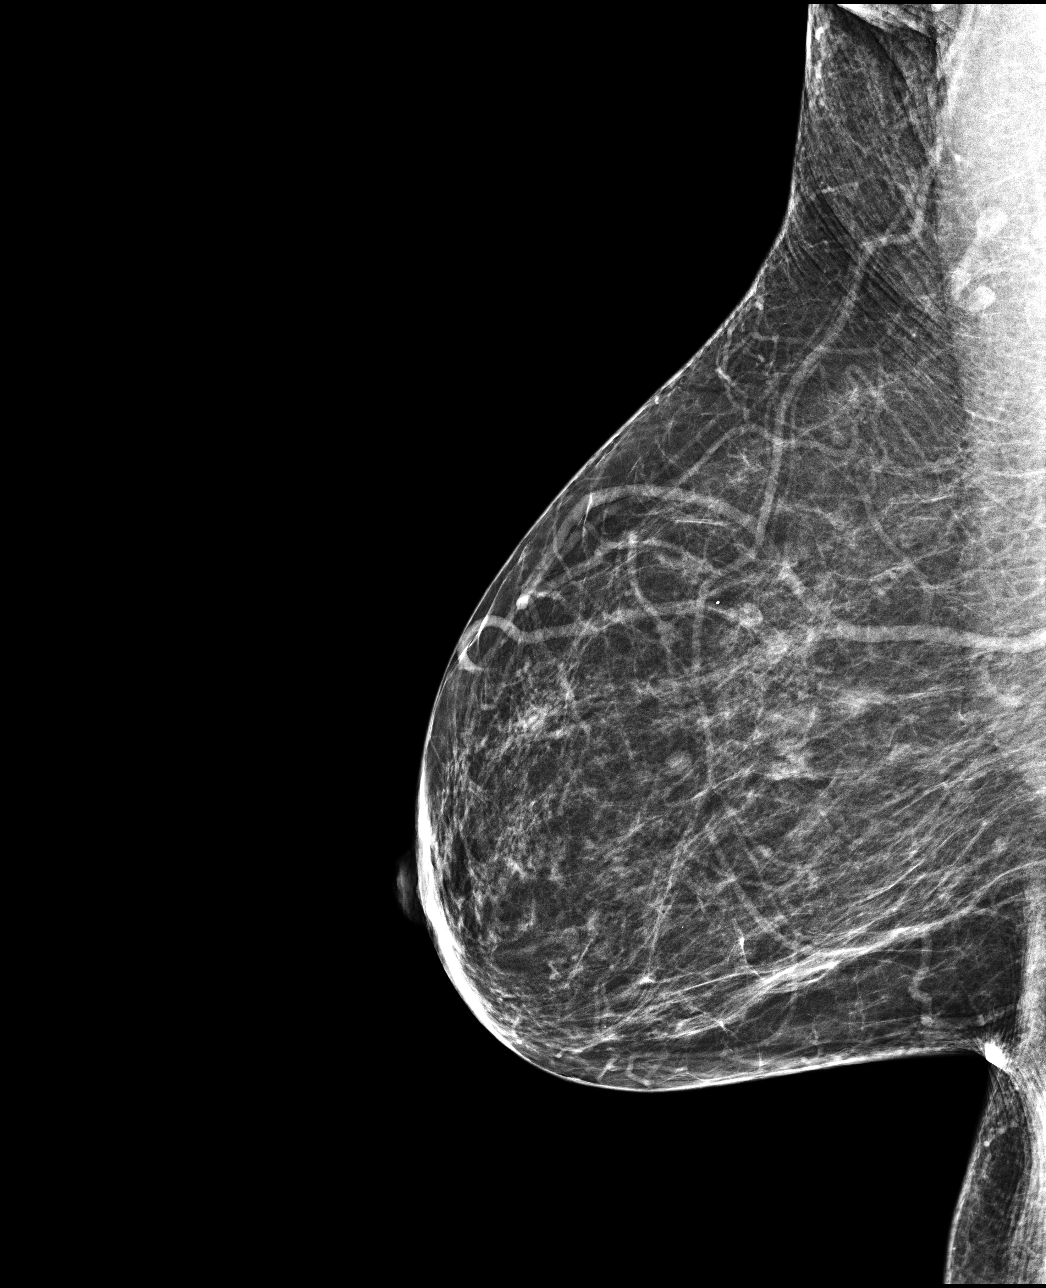

[4 of 4 positions shown; findings below may reference images not displayed]

ACR Breast Density Category b: There are scattered areas of
fibroglandular density.
FINDINGS: There are no findings suspicious for malignancy. Images were
processed with CAD.
IMPRESSION: No mammographic evidence of malignancy. A result letter of this
screening mammogram will be mailed directly to the patient.

RECOMMENDATION:
Screening mammogram in one year. (Code:AS-G-LCT)

BI-RADS CATEGORY  1: Negative.

## 2019-06-05 ENCOUNTER — Encounter: Payer: Self-pay | Admitting: *Deleted

## 2019-06-12 ENCOUNTER — Ambulatory Visit: Payer: 59 | Admitting: Adult Health

## 2019-06-12 ENCOUNTER — Encounter: Payer: Self-pay | Admitting: Adult Health

## 2019-06-12 VITALS — Ht 66.5 in | Wt 170.0 lb

## 2019-06-12 DIAGNOSIS — L509 Urticaria, unspecified: Secondary | ICD-10-CM

## 2019-06-12 DIAGNOSIS — T7840XS Allergy, unspecified, sequela: Secondary | ICD-10-CM

## 2019-06-12 MED ORDER — FAMOTIDINE 20 MG PO TABS: 20.0000 mg | ORAL_TABLET | Freq: Two times a day (BID) | ORAL | 0 refills | Status: DC

## 2019-06-12 MED ORDER — LORATADINE 10 MG PO TABS: 10.0000 mg | ORAL_TABLET | Freq: Every day | ORAL | 0 refills | Status: DC

## 2019-06-12 MED ORDER — HYDROCORTISONE 1 % EX OINT: 1.0000 "application " | TOPICAL_OINTMENT | Freq: Two times a day (BID) | CUTANEOUS | 0 refills | Status: DC

## 2019-06-12 NOTE — Progress Notes (Signed)
[facility] [place], [place] [address]  Internal MEDICINE  Telephone Visit  Patient Name: Briana Snyder  [id number]  [id number]  Date of Service: 06/12/2019  I connected with the patient at [phone] by telephone and verified the patients identity using two identifiers.   I discussed the limitations, risks, security and privacy concerns of performing an evaluation and management service by telephone and the availability of in person appointments. I also discussed with the patient that there may be a patient responsible charge related to the service.  The patient expressed understanding and agrees to proceed.    Chief Complaint  Patient presents with  . Telephone Assessment    covid positive   . Telephone Screen  . Rash    and hives all over the body  . Nausea  . Nasal Congestion  . Cough    HPI  Pt seen via telephone. She reports she tested positive on 06/04/2019 for covid.  She reports a day or so later noticed itching and hives on her hands.  Now it is all over her legs and now trunk.  She reports they are all over her body, except her face.  The only new product she is using is a cough syrup from [employer].      Current Medication: Outpatient Encounter Medications as of 06/12/2019  Medication Sig  . busPIRone (BUSPAR) 10 MG tablet Take 1 tablet (10 mg total) by mouth 2 (two) times daily.  . butalbital-acetaminophen-caffeine (FIORICET WITH CODEINE) 50-325-40-30 MG capsule Take 1 capsule(s) by mouth three times a day as needed for headache  . fluticasone (FLONASE) 50 MCG/ACT nasal spray Place 1 spray into both nostrils as needed.  Marland Kitchen omeprazole (PRILOSEC) 40 MG capsule Take 1 capsule (40 mg total) by mouth as needed.  Marland Kitchen PROAIR HFA 108 (90 Base) MCG/ACT inhaler Inhale 1 puff into the lungs every 4 (four) hours as needed.  . triamcinolone (KENALOG) 0.025 % ointment Apply 1 application topically 2 (two) times daily.  . famotidine (PEPCID) 20 MG tablet Take  1 tablet (20 mg total) by mouth 2 (two) times daily.  . hydrocortisone 1 % ointment Apply 1 application topically 2 (two) times daily.  Marland Kitchen loratadine (CLARITIN) 10 MG tablet Take 1 tablet (10 mg total) by mouth daily.   No facility-administered encounter medications on file as of 06/12/2019.    Surgical History: Past Surgical History:  Procedure Laterality Date  . BREAST BIOPSY Left 08/2010   FIBROADENOMA, 1.0 CM.   Marland Kitchen BREAST LUMPECTOMY Left 2012  . COLONOSCOPY N/A 01/28/2015   Procedure: COLONOSCOPY;  Surgeon: Christene Lye, MD;  Location: [facility] ENDOSCOPY;  Service: Endoscopy;  Laterality: N/A;  . endometrial polyps  08/2010   resected  . HERNIA REPAIR  2000   inguinal   . LAPAROSCOPIC RIGHT HEMI COLECTOMY Right 02/25/2015   Procedure: LAPAROSCOPIC RIGHT HEMI COLECTOMY;  Surgeon: Christene Lye, MD;  Location: [facility] ORS;  Service: General;  Laterality: Right;  . THYROIDECTOMY  1987   Right  . VARICOSE VEIN SURGERY      Medical History: Past Medical History:  Diagnosis Date  . Anemia   . Anxiety   . Asthma   . Cancer ([place])    T2,   . GERD (gastroesophageal reflux disease)   . Headache     Family History: Family History  Problem Relation Age of Onset  . Breast cancer Mother 56  . Prostate cancer Father     Social History  Socioeconomic History  . Marital status: Divorced    Spouse name: Not on file  . Number of children: Not on file  . Years of education: Not on file  . Highest education level: Not on file  Occupational History  . Not on file  Tobacco Use  . Smoking status: Former Smoker    Packs/day: 0.50    Years: 18.00    Pack years: 9.00    Types: Cigarettes    Quit date: 05/25/2011    Years since quitting: 8.0  . Smokeless tobacco: Never Used  Substance and Sexual Activity  . Alcohol use: Yes    Alcohol/week: 0.0 standard drinks    Comment: pt states she drinks beer almost every day-pt unsure when asked how many cans of beer   . Drug use: No   . Sexual activity: Not on file  Other Topics Concern  . Not on file  Social History Narrative  . Not on file   Social Determinants of Health   Financial Resource Strain:   . Difficulty of Paying Living Expenses: Not on file  Food Insecurity:   . Worried About Charity fundraiser in the Last Year: Not on file  . Ran Out of Food in the Last Year: Not on file  Transportation Needs:   . Lack of Transportation (Medical): Not on file  . Lack of Transportation (Non-Medical): Not on file  Physical Activity:   . Days of Exercise per Week: Not on file  . Minutes of Exercise per Session: Not on file  Stress:   . Feeling of Stress : Not on file  Social Connections:   . Frequency of Communication with Friends and Family: Not on file  . Frequency of Social Gatherings with Friends and Family: Not on file  . Attends Religious Services: Not on file  . Active Member of Clubs or Organizations: Not on file  . Attends Archivist Meetings: Not on file  . Marital Status: Not on file  Intimate Partner Violence:   . Fear of Current or Ex-Partner: Not on file  . Emotionally Abused: Not on file  . Physically Abused: Not on file  . Sexually Abused: Not on file      Review of Systems  Constitutional: Negative for chills, fatigue and unexpected weight change.  HENT: Negative for congestion, rhinorrhea, sneezing and sore throat.   Eyes: Negative for photophobia, pain and redness.  Respiratory: Positive for cough. Negative for chest tightness and shortness of breath.   Cardiovascular: Negative for chest pain and palpitations.  Gastrointestinal: Negative for abdominal pain, constipation, diarrhea, nausea and vomiting.  Endocrine: Negative.   Genitourinary: Negative for dysuria and frequency.  Musculoskeletal: Negative for arthralgias, back pain, joint swelling and neck pain.  Skin: Positive for rash.       Hives scattered about trunk and extremities.  Allergic/Immunologic: Negative.    Neurological: Negative for tremors and numbness.  Hematological: Negative for adenopathy. Does not bruise/bleed easily.  Psychiatric/Behavioral: Negative for behavioral problems and sleep disturbance. The patient is not nervous/anxious.     Vital Signs: Ht 5' 6.5" (1.689 m)   Wt 170 lb (77.1 kg)   LMP 02/08/2015 Comment: u preg negative  BMI 27.03 kg/m    Observation/Objective:  Well sounding, NAD noted at this time.    Assessment/Plan: 1. Allergic reaction, sequela Stop using cough syrup, and buy another brand. May take Pepcid and Claritin as discussed.  - famotidine (PEPCID) 20 MG tablet; Take 1 tablet (20 mg  total) by mouth 2 (two) times daily.  Dispense: 30 tablet; Refill: 0 - loratadine (CLARITIN) 10 MG tablet; Take 1 tablet (10 mg total) by mouth daily.  Dispense: 30 tablet; Refill: 0  2. Hives Use as on areas that itch as discussed.  - hydrocortisone 1 % ointment; Apply 1 application topically 2 (two) times daily.  Dispense: 56 g; Refill: 0  General Counseling: Gurbani verbalizes understanding of the findings of today's phone visit and agrees with plan of treatment. I have discussed any further diagnostic evaluation that may be needed or ordered today. We also reviewed her medications today. she has been encouraged to call the office with any questions or concerns that should arise related to todays visit.    No orders of the defined types were placed in this encounter.   Meds ordered this encounter  Medications  . famotidine (PEPCID) 20 MG tablet    Sig: Take 1 tablet (20 mg total) by mouth 2 (two) times daily.    Dispense:  30 tablet    Refill:  0  . loratadine (CLARITIN) 10 MG tablet    Sig: Take 1 tablet (10 mg total) by mouth daily.    Dispense:  30 tablet    Refill:  0  . hydrocortisone 1 % ointment    Sig: Apply 1 application topically 2 (two) times daily.    Dispense:  56 g    Refill:  0    Time spent: [place] AGNP-C Internal  medicine

## 2019-07-18 ENCOUNTER — Ambulatory Visit: Payer: 59 | Admitting: Surgery

## 2019-07-18 ENCOUNTER — Other Ambulatory Visit: Payer: Self-pay

## 2019-07-18 ENCOUNTER — Encounter: Payer: Self-pay | Admitting: Surgery

## 2019-07-18 VITALS — BP 160/105 | HR 93 | Temp 98.1°F | Resp 12 | Ht 66.0 in | Wt 166.8 lb

## 2019-07-18 DIAGNOSIS — D120 Benign neoplasm of cecum: Secondary | ICD-10-CM

## 2019-07-18 DIAGNOSIS — Z1231 Encounter for screening mammogram for malignant neoplasm of breast: Secondary | ICD-10-CM | POA: Diagnosis not present

## 2019-07-18 NOTE — Patient Instructions (Addendum)
We have sent over a referral to [facility]. They will contact you to schedule an appointment to get a Colonoscopy within 7-10 days. If you do not hear from there office give our office a call.   We have also put an order in for a mammogram to be done. You will hear from the Breast center with an appointment. We will see you after your mammogram for a breast exam.   We also placed in orders for lab work to be done. Please get these done at the [facility] at the [facility].

## 2019-07-19 ENCOUNTER — Other Ambulatory Visit: Payer: Self-pay

## 2019-07-19 ENCOUNTER — Other Ambulatory Visit
Admission: RE | Admit: 2019-07-19 | Discharge: 2019-07-19 | Disposition: A | Discharge status: Home / Self Care | Payer: 59 | Source: Ambulatory Visit | Attending: Surgery | Admitting: Surgery

## 2019-07-19 ENCOUNTER — Telehealth: Payer: Self-pay | Admitting: Emergency Medicine

## 2019-07-19 DIAGNOSIS — D120 Benign neoplasm of cecum: Secondary | ICD-10-CM | POA: Diagnosis not present

## 2019-07-19 LAB — CBC
HCT: 37.6 % (ref 36.0–46.0)
Hemoglobin: 12.5 g/dL (ref 12.0–15.0)
MCH: 29.4 pg (ref 26.0–34.0)
MCHC: 33.2 g/dL (ref 30.0–36.0)
MCV: 88.5 fL (ref 80.0–100.0)
Platelets: 192 10*3/uL (ref 150–400)
RBC: 4.25 MIL/uL (ref 3.87–5.11)
RDW: 15.7 % — ABNORMAL HIGH (ref 11.5–15.5)
WBC: 3 10*3/uL — ABNORMAL LOW (ref 4.0–10.5)
nRBC: 0 % (ref 0.0–0.2)

## 2019-07-19 LAB — COMPREHENSIVE METABOLIC PANEL
ALT: 18 U/L (ref 0–44)
AST: 33 U/L (ref 15–41)
Albumin: 4.1 g/dL (ref 3.5–5.0)
Alkaline Phosphatase: 75 U/L (ref 38–126)
Anion gap: 9 (ref 5–15)
BUN: 10 mg/dL (ref 6–20)
CO2: 25 mmol/L (ref 22–32)
Calcium: 8.1 mg/dL — ABNORMAL LOW (ref 8.9–10.3)
Chloride: 107 mmol/L (ref 98–111)
Creatinine, Ser: 0.74 mg/dL (ref 0.44–1.00)
GFR calc Af Amer: >60 mL/min (ref >60)
GFR calc non Af Amer: >60 mL/min (ref >60)
Glucose, Bld: 88 mg/dL (ref 70–99)
Potassium: 3.9 mmol/L (ref 3.5–5.1)
Sodium: 141 mmol/L (ref 135–145)
Total Bilirubin: 1 mg/dL (ref 0.3–1.2)
Total Protein: 7.9 g/dL (ref 6.5–8.1)

## 2019-07-19 NOTE — Telephone Encounter (Signed)
Called pt to make her aware of mammogram appt: 08/02/19 at 4:20pm at the [facility].  Follow up appt with Dr Dahlia Byes 08/06/19 at 3:15pm. Must have bloodwork and mammogram done prior to appt.  Pt did not answer. Left vm to call back to the office.

## 2019-07-20 LAB — CEA: CEA: 2.2 ng/mL (ref 0.0–4.7)

## 2019-07-20 NOTE — Telephone Encounter (Signed)
Pt made aware of mammogram and follow up appointments. Also made pt aware about referral made to [facility] to get colonoscopy done. Lastly pt made aware of lab work being within normal limits per Dr Dahlia Byes. Pt voiced understanding.

## 2019-07-21 NOTE — Progress Notes (Signed)
Outpatient Surgical Follow Up  07/21/2019  Briana Snyder is an 54 y.o. female.   Chief Complaint  Patient presents with  . Follow-up    35yr f/u right colon cancer     HPI: Briana Snyder is a 54 year old female well-known to our practice with a history of right colectomy for adenocarcinoma.  Did not require any adjuvant chemotherapy.  She is here for routine follow-up.  Denies any fevers any chills no weight loss.  No melena no nausea no vomiting and no abdominal pain.  She did had Covid last month and has recovered now.  She did have a history of fibroadenoma requiring lumpectomy in 2012.  Her mother had a history of breast cancer at age 26.  Father with a history of prostate cancer.  He did have a mammogram 2019 that I have personally reviewed showing no evidence of any concerning lesions.   Past Medical History:  Diagnosis Date  . Anemia   . Anxiety   . Asthma   . Cancer ([place])    T2,   . GERD (gastroesophageal reflux disease)   . Headache     Past Surgical History:  Procedure Laterality Date  . BREAST BIOPSY Left 08/2010   FIBROADENOMA, 1.0 CM.   Marland Kitchen BREAST LUMPECTOMY Left 2012  . COLONOSCOPY N/A 01/28/2015   Procedure: COLONOSCOPY;  Surgeon: Christene Lye, MD;  Location: [facility] ENDOSCOPY;  Service: Endoscopy;  Laterality: N/A;  . endometrial polyps  08/2010   resected  . HERNIA REPAIR  2000   inguinal   . LAPAROSCOPIC RIGHT HEMI COLECTOMY Right 02/25/2015   Procedure: LAPAROSCOPIC RIGHT HEMI COLECTOMY;  Surgeon: Christene Lye, MD;  Location: [facility] ORS;  Service: General;  Laterality: Right;  . THYROIDECTOMY  1987   Right  . VARICOSE VEIN SURGERY      Family History  Problem Relation Age of Onset  . Breast cancer Mother 110  . Prostate cancer Father     Social History:  reports that she quit smoking about 8 years ago. Her smoking use included cigarettes. She has a 9.00 pack-year smoking history. She has never used smokeless tobacco. She reports current alcohol use.  She reports that she does not use drugs.  Allergies: No Known Allergies  Medications reviewed.    ROS Full ROS performed and is otherwise negative other than what is stated in HPI   BP (!) 160/105   Pulse 93   Temp 98.1 F (36.7 C)   Resp 12   Ht 5\' 6"  (1.676 m)   Wt 166 lb 12.8 oz (75.7 kg)   LMP 02/08/2015 Comment: u preg negative  SpO2 98%   BMI 26.92 kg/m   Physical Exam Vitals and nursing note reviewed. Exam conducted with a chaperone present.  Constitutional:      General: She is not in acute distress.    Appearance: Normal appearance.  Eyes:     General: No scleral icterus.       Right eye: No discharge.        Left eye: No discharge.  Cardiovascular:     Rate and Rhythm: Normal rate and regular rhythm.     Heart sounds: No murmur. No gallop.   Pulmonary:     Effort: Pulmonary effort is normal. No respiratory distress.     Breath sounds: Normal breath sounds. No stridor. No wheezing or rhonchi.  Abdominal:     General: Abdomen is flat. There is no distension.     Palpations: There is no mass.  Tenderness: There is no abdominal tenderness. There is no guarding or rebound.     Hernia: No hernia is present.  Musculoskeletal:        General: No swelling. Normal range of motion.     Cervical back: Normal range of motion and neck supple.  Skin:    General: Skin is warm and dry.     Capillary Refill: Capillary refill takes less than 2 seconds.  Neurological:     General: No focal deficit present.     Mental Status: She is alert and oriented to person, place, and time.  Psychiatric:        Mood and Affect: Mood normal.        Behavior: Behavior normal.        Thought Content: Thought content normal.        Judgment: Judgment normal.       Assessment/Plan: 54 year old female with a hx of adenoca on the right colon status post right colectomy 5 years ago.  He has been following with [facility] but has not had a colonoscopy.  Definitely will benefit from  colonoscopy.  Referral placed to GI.  Also never had a mammogram and we will schedule this.  I will see her back once these 2 orders arecompleted. We will order a CEA level as well as some basic CBC and CMP. Greater than 50% of the 40 minutes  visit was spent in counseling/coordination of care   Caroleen Hamman, MD [facility]

## 2019-07-24 ENCOUNTER — Other Ambulatory Visit: Payer: Self-pay | Admitting: Adult Health

## 2019-07-24 DIAGNOSIS — F411 Generalized anxiety disorder: Secondary | ICD-10-CM

## 2019-08-02 ENCOUNTER — Ambulatory Visit
Admission: RE | Admit: 2019-08-02 | Discharge: 2019-08-02 | Disposition: A | Discharge status: Home / Self Care | Payer: 59 | Source: Ambulatory Visit | Attending: Surgery | Admitting: Surgery

## 2019-08-02 DIAGNOSIS — Z1231 Encounter for screening mammogram for malignant neoplasm of breast: Secondary | ICD-10-CM | POA: Diagnosis present

## 2019-08-03 ENCOUNTER — Telehealth: Payer: Self-pay

## 2019-08-03 NOTE — Telephone Encounter (Signed)
-----   Message from Jules Husbands, MD sent at 08/03/2019 11:34 AM EST ----- Please let her know mammo is nml ----- Message ----- From: Interface, Rad Results In Sent: 08/03/2019  10:32 AM EST To: Jules Husbands, MD

## 2019-08-03 NOTE — Telephone Encounter (Signed)
Message left for the patient notifying her that her results were normal and that she could call to schedule her follow up with Dr Dahlia Byes at her convenience.

## 2019-08-06 ENCOUNTER — Ambulatory Visit: Payer: Self-pay | Admitting: Surgery

## 2019-10-04 ENCOUNTER — Encounter: Payer: Self-pay | Admitting: *Deleted

## 2019-10-18 ENCOUNTER — Ambulatory Visit: Payer: 59 | Admitting: Nurse Practitioner

## 2019-10-18 ENCOUNTER — Other Ambulatory Visit: Payer: Self-pay

## 2019-10-18 ENCOUNTER — Telehealth: Payer: Self-pay

## 2019-10-18 ENCOUNTER — Encounter: Payer: Self-pay | Admitting: Nurse Practitioner

## 2019-10-18 VITALS — BP 132/100 | HR 98 | Temp 97.6°F | Resp 16 | Ht 66.0 in | Wt 160.8 lb

## 2019-10-18 DIAGNOSIS — G44209 Tension-type headache, unspecified, not intractable: Secondary | ICD-10-CM

## 2019-10-18 DIAGNOSIS — I10 Essential (primary) hypertension: Secondary | ICD-10-CM

## 2019-10-18 MED ORDER — AMLODIPINE BESYLATE 2.5 MG PO TABS: 2.5000 mg | ORAL_TABLET | Freq: Every day | ORAL | 1 refills | Status: DC

## 2019-10-18 NOTE — Telephone Encounter (Signed)
Patient did not schedule appointment at check out and stated will call back for follow up. Briana Snyder

## 2019-10-18 NOTE — Progress Notes (Signed)
[facility] [place], [place] [address]  Internal MEDICINE  Office Visit Note  Patient Name: Briana Snyder  [id number]  [id number]  Date of Service: 10/22/2019   Pt is here for a sick visit.  Chief Complaint  Patient presents with  . Hypertension  . Medication Refill    needs refill on all medications      The patient presents for sick visit. She states that her blood pressure has been running high recently. She has been having headaches. She states that she has been having been low grade headache for some time.  She denies chest pain, chest pressure, or shortness of breath. She is overdue to have routine, fasting labs. She is also due to have routine physical and pap smear.        Current Medication:  Outpatient Encounter Medications as of 10/18/2019  Medication Sig  . busPIRone (BUSPAR) 10 MG tablet TAKE 1 TABLET(10 MG) BY MOUTH TWICE DAILY  . fluticasone (FLONASE) 50 MCG/ACT nasal spray Place 1 spray into both nostrils as needed.  . hydrocortisone 1 % ointment Apply 1 application topically 2 (two) times daily.  Marland Kitchen PROAIR HFA 108 (90 Base) MCG/ACT inhaler Inhale 1 puff into the lungs every 4 (four) hours as needed.  . triamcinolone ointment (KENALOG) 0.5 % Apply 1 application topically 2 (two) times daily.  Marland Kitchen amLODipine (NORVASC) 2.5 MG tablet Take 1 tablet (2.5 mg total) by mouth daily.   No facility-administered encounter medications on file as of 10/18/2019.      Medical History: Past Medical History:  Diagnosis Date  . Anemia   . Anxiety   . Asthma   . Cancer ([place])    T2,   . GERD (gastroesophageal reflux disease)   . Headache      Today's Vitals   10/18/19 1626  BP: (!) 132/100  Pulse: 98  Resp: 16  Temp: 97.6 F (36.4 C)  SpO2: 99%  Weight: 160 lb 12.8 oz (72.9 kg)  Height: 5\' 6"  (1.676 m)   Body mass index is 25.95 kg/m.  Review of Systems  Constitutional: Positive for fatigue. Negative for activity change,  chills and unexpected weight change.  HENT: Negative for congestion, postnasal drip, rhinorrhea, sneezing and sore throat.   Respiratory: Negative for cough, chest tightness and shortness of breath.   Cardiovascular: Negative for chest pain and palpitations.       Mildly elevated blood pressure today.   Gastrointestinal: Negative for abdominal pain, constipation, diarrhea, nausea and vomiting.  Endocrine: Negative for cold intolerance, heat intolerance, polydipsia and polyuria.  Musculoskeletal: Negative for arthralgias, back pain, joint swelling and neck pain.  Skin: Negative for rash.  Allergic/Immunologic: Negative for environmental allergies.  Neurological: Positive for headaches. Negative for dizziness, tremors and numbness.  Hematological: Negative for adenopathy. Does not bruise/bleed easily.  Psychiatric/Behavioral: Negative for behavioral problems (Depression), sleep disturbance and suicidal ideas. The patient is not nervous/anxious.     Physical Exam Vitals and nursing note reviewed.  Constitutional:      General: She is not in acute distress.    Appearance: Normal appearance. She is well-developed. She is not diaphoretic.  HENT:     Head: Normocephalic and atraumatic.     Nose: Nose normal.     Mouth/Throat:     Pharynx: No oropharyngeal exudate.  Eyes:     Pupils: Pupils are equal, round, and reactive to light.  Neck:     Thyroid: No thyromegaly.     Vascular: No  carotid bruit or JVD.     Trachea: No tracheal deviation.  Cardiovascular:     Rate and Rhythm: Normal rate and regular rhythm.     Heart sounds: Normal heart sounds. No murmur. No friction rub. No gallop.   Pulmonary:     Effort: Pulmonary effort is normal. No respiratory distress.     Breath sounds: Normal breath sounds. No wheezing or rales.  Chest:     Chest wall: No tenderness.  Abdominal:     Palpations: Abdomen is soft.  Musculoskeletal:        General: Normal range of motion.     Cervical back:  Normal range of motion and neck supple.  Lymphadenopathy:     Cervical: No cervical adenopathy.  Skin:    General: Skin is warm and dry.  Neurological:     General: No focal deficit present.     Mental Status: She is alert and oriented to person, place, and time.     Cranial Nerves: No cranial nerve deficit.  Psychiatric:        Mood and Affect: Mood normal.        Behavior: Behavior normal.        Thought Content: Thought content normal.        Judgment: Judgment normal.    Assessment/Plan: 1. Essential hypertension Start amlodipine 2.5mg  tablets daily. Advised her to limit salt and increase water intake in the diet. Encouraged her to exercise routinely. Will check routine, fasting labs prior to next visit.  - amLODipine (NORVASC) 2.5 MG tablet; Take 1 tablet (2.5 mg total) by mouth daily.  Dispense: 30 tablet; Refill: 1  2. Tension-type headache, not intractable, unspecified chronicity pattern Likely related to elevated blood pressure. Advised her to monitor pattern of headaches and blood pressure. Will reassess at next visit.   General Counseling: Briana Snyder verbalizes understanding of the findings of todays visit and agrees with plan of treatment. I have discussed any further diagnostic evaluation that may be needed or ordered today. We also reviewed her medications today. she has been encouraged to call the office with any questions or concerns that should arise related to todays visit.    Counseling:   Hypertension Counseling:   The following hypertensive lifestyle modification were recommended and discussed:  1. Limiting alcohol intake to less than 1 oz/day of ethanol:(24 oz of beer or 8 oz of wine or 2 oz of 100-proof whiskey). 2. Take baby ASA 81 mg daily. 3. Importance of regular aerobic exercise and losing weight. 4. Reduce dietary saturated fat and cholesterol intake for overall cardiovascular health. 5. Maintaining adequate dietary potassium, calcium, and magnesium  intake. 6. Regular monitoring of the blood pressure. 7. Reduce sodium intake to less than 100 mmol/day (less than 2.3 gm of sodium or less than 6 gm of sodium choride)   This patient was seen by [place] with Dr Lavera Guise as a part of collaborative care agreement  Meds ordered this encounter  Medications  . amLODipine (NORVASC) 2.5 MG tablet    Sig: Take 1 tablet (2.5 mg total) by mouth daily.    Dispense:  30 tablet    Refill:  1    Order Specific Question:   Supervising Provider    Answer:   Lavera Guise [id number]    Time spent: 30 Minutes

## 2019-10-22 DIAGNOSIS — I10 Essential (primary) hypertension: Secondary | ICD-10-CM | POA: Insufficient documentation

## 2019-12-07 ENCOUNTER — Encounter: Payer: Self-pay | Admitting: Nurse Practitioner

## 2019-12-07 ENCOUNTER — Other Ambulatory Visit: Payer: Self-pay

## 2019-12-07 ENCOUNTER — Other Ambulatory Visit
Admission: RE | Admit: 2019-12-07 | Discharge: 2019-12-07 | Disposition: A | Discharge status: Home / Self Care | Payer: 59 | Attending: Nurse Practitioner | Admitting: Nurse Practitioner

## 2019-12-07 ENCOUNTER — Telehealth: Payer: Self-pay

## 2019-12-07 ENCOUNTER — Ambulatory Visit: Payer: 59 | Admitting: Nurse Practitioner

## 2019-12-07 VITALS — BP 119/89 | HR 83 | Temp 97.7°F | Resp 16 | Ht 66.0 in | Wt 158.0 lb

## 2019-12-07 DIAGNOSIS — Z0000 Encounter for general adult medical examination without abnormal findings: Secondary | ICD-10-CM | POA: Diagnosis not present

## 2019-12-07 DIAGNOSIS — E559 Vitamin D deficiency, unspecified: Secondary | ICD-10-CM | POA: Insufficient documentation

## 2019-12-07 DIAGNOSIS — J4520 Mild intermittent asthma, uncomplicated: Secondary | ICD-10-CM | POA: Diagnosis not present

## 2019-12-07 DIAGNOSIS — E611 Iron deficiency: Secondary | ICD-10-CM | POA: Diagnosis not present

## 2019-12-07 DIAGNOSIS — I10 Essential (primary) hypertension: Secondary | ICD-10-CM

## 2019-12-07 DIAGNOSIS — F10250 Alcohol dependence with alcohol-induced psychotic disorder with delusions: Secondary | ICD-10-CM | POA: Insufficient documentation

## 2019-12-07 DIAGNOSIS — Z124 Encounter for screening for malignant neoplasm of cervix: Secondary | ICD-10-CM | POA: Diagnosis not present

## 2019-12-07 DIAGNOSIS — R569 Unspecified convulsions: Secondary | ICD-10-CM

## 2019-12-07 DIAGNOSIS — R300 Dysuria: Secondary | ICD-10-CM

## 2019-12-07 DIAGNOSIS — F411 Generalized anxiety disorder: Secondary | ICD-10-CM

## 2019-12-07 DIAGNOSIS — Z0001 Encounter for general adult medical examination with abnormal findings: Secondary | ICD-10-CM | POA: Diagnosis not present

## 2019-12-07 DIAGNOSIS — L209 Atopic dermatitis, unspecified: Secondary | ICD-10-CM

## 2019-12-07 LAB — COMPREHENSIVE METABOLIC PANEL WITH GFR
ALT: 18 U/L (ref 0–44)
AST: 21 U/L (ref 15–41)
Albumin: 3.7 g/dL (ref 3.5–5.0)
Alkaline Phosphatase: 62 U/L (ref 38–126)
Anion gap: 8 (ref 5–15)
BUN: 11 mg/dL (ref 6–20)
CO2: 27 mmol/L (ref 22–32)
Calcium: 8.5 mg/dL — ABNORMAL LOW (ref 8.9–10.3)
Chloride: 106 mmol/L (ref 98–111)
Creatinine, Ser: 0.81 mg/dL (ref 0.44–1.00)
GFR calc Af Amer: >60 mL/min
GFR calc non Af Amer: >60 mL/min
Glucose, Bld: 80 mg/dL (ref 70–99)
Potassium: 4.1 mmol/L (ref 3.5–5.1)
Sodium: 141 mmol/L (ref 135–145)
Total Bilirubin: 0.7 mg/dL (ref 0.3–1.2)
Total Protein: 7.3 g/dL (ref 6.5–8.1)

## 2019-12-07 LAB — CBC
HCT: 36.1 % (ref 36.0–46.0)
Hemoglobin: 12.3 g/dL (ref 12.0–15.0)
MCH: 30 pg (ref 26.0–34.0)
MCHC: 34.1 g/dL (ref 30.0–36.0)
MCV: 88 fL (ref 80.0–100.0)
Platelets: 205 10*3/uL (ref 150–400)
RBC: 4.1 MIL/uL (ref 3.87–5.11)
RDW: 15.1 % (ref 11.5–15.5)
WBC: 3.1 10*3/uL — ABNORMAL LOW (ref 4.0–10.5)
nRBC: 0 % (ref 0.0–0.2)

## 2019-12-07 LAB — LIPID PANEL
Cholesterol: 180 mg/dL (ref 0–200)
HDL: 76 mg/dL
LDL Cholesterol: 66 mg/dL (ref 0–99)
Total CHOL/HDL Ratio: 2.4 ratio
Triglycerides: 192 mg/dL — ABNORMAL HIGH
VLDL: 38 mg/dL (ref 0–40)

## 2019-12-07 LAB — T4, FREE: Free T4: 0.88 ng/dL (ref 0.61–1.12)

## 2019-12-07 LAB — VITAMIN D 25 HYDROXY (VIT D DEFICIENCY, FRACTURES): Vit D, 25-Hydroxy: 15.23 ng/mL — ABNORMAL LOW (ref 30–100)

## 2019-12-07 LAB — TSH: TSH: 0.573 u[IU]/mL (ref 0.350–4.500)

## 2019-12-07 LAB — VITAMIN B12: Vitamin B-12: 184 pg/mL (ref 180–914)

## 2019-12-07 LAB — FOLATE: Folate: 5.2 ng/mL — ABNORMAL LOW

## 2019-12-07 MED ORDER — BUSPIRONE HCL 10 MG PO TABS: 10.0000 mg | ORAL_TABLET | Freq: Two times a day (BID) | ORAL | 3 refills | Status: DC

## 2019-12-07 MED ORDER — AMLODIPINE BESYLATE 2.5 MG PO TABS: 2.5000 mg | ORAL_TABLET | Freq: Every day | ORAL | 3 refills | Status: DC

## 2019-12-07 MED ORDER — PROAIR HFA 108 (90 BASE) MCG/ACT IN AERS: 1.0000 | INHALATION_SPRAY | RESPIRATORY_TRACT | 5 refills | Status: DC | PRN

## 2019-12-07 MED ORDER — TRIAMCINOLONE ACETONIDE 0.5 % EX OINT: 1.0000 "application " | TOPICAL_OINTMENT | Freq: Two times a day (BID) | CUTANEOUS | 3 refills | Status: DC

## 2019-12-07 NOTE — Progress Notes (Signed)
So far, labs are ok. Will need to start folate supplement. Waiting on all results.

## 2019-12-07 NOTE — Progress Notes (Signed)
[facility] [place], [place] [address]  Internal MEDICINE  Office Visit Note  Patient Name: Briana Snyder  [id number]  [id number]  Date of Service: 12/07/2019   Pt is here for routine health maintenance examination    Chief Complaint  Patient presents with  . Annual Exam    Papsmear  . Wakes up at night w feeling of "movement" in the head    Also wakes up shaking  . Quality Metric Gaps    TDAP     The patient is here for health maintenance exam and pap smear. She states that, nearly every night, she will wake up from sleep, shaking uncontrollably. She states that this is happening at least once a week in the past few months. She also states that she has a strange sensation in her head when this happens. Will sometimes have some tingling in her left foot and her left arm. States that these symptoms last for about a minute. States that this has been going on foe several months, but this has gradually become worse.  She states that she does consume alcohol on a regular basis, only in the evenings. She will generally drink approximately four drinks per night. Two drinks containing vodka and two beers per night.  She does have history of Stage 1 colon cancer. She does follow up with oncology every year. She will have a follow up colonoscopy later this year for surveillance.  She was started on amlodipine 2.5mg  daily at her most recent visit. This has been effective medication and patient is tolerating this well. States that she has noticed decreased daily headaches. She reports no negative side effects.   Current Medication: Outpatient Encounter Medications as of 12/07/2019  Medication Sig  . amLODipine (NORVASC) 2.5 MG tablet Take 1 tablet (2.5 mg total) by mouth daily.  . busPIRone (BUSPAR) 10 MG tablet Take 1 tablet (10 mg total) by mouth 2 (two) times daily.  . fluticasone (FLONASE) 50 MCG/ACT nasal spray Place 1 spray into both nostrils as needed.  .  hydrocortisone 1 % ointment Apply 1 application topically 2 (two) times daily.  Marland Kitchen PROAIR HFA 108 (90 Base) MCG/ACT inhaler Inhale 1 puff into the lungs every 4 (four) hours as needed.  . triamcinolone ointment (KENALOG) 0.5 % Apply 1 application topically 2 (two) times daily.  . [DISCONTINUED] amLODipine (NORVASC) 2.5 MG tablet Take 1 tablet (2.5 mg total) by mouth daily.  . [DISCONTINUED] busPIRone (BUSPAR) 10 MG tablet TAKE 1 TABLET(10 MG) BY MOUTH TWICE DAILY  . [DISCONTINUED] PROAIR HFA 108 (90 Base) MCG/ACT inhaler Inhale 1 puff into the lungs every 4 (four) hours as needed.  . [DISCONTINUED] triamcinolone ointment (KENALOG) 0.5 % Apply 1 application topically 2 (two) times daily.   No facility-administered encounter medications on file as of 12/07/2019.    Surgical History: Past Surgical History:  Procedure Laterality Date  . BREAST BIOPSY Left 2012   FIBROADENOMA, 1.0 CM.   Marland Kitchen BREAST LUMPECTOMY Left 2012  . COLONOSCOPY N/A 01/28/2015   Procedure: COLONOSCOPY;  Surgeon: Christene Lye, MD;  Location: [facility] ENDOSCOPY;  Service: Endoscopy;  Laterality: N/A;  . endometrial polyps  08/2010   resected  . HERNIA REPAIR  2000   inguinal   . LAPAROSCOPIC RIGHT HEMI COLECTOMY Right 02/25/2015   Procedure: LAPAROSCOPIC RIGHT HEMI COLECTOMY;  Surgeon: Christene Lye, MD;  Location: [facility] ORS;  Service: General;  Laterality: Right;  . THYROIDECTOMY  1987   Right  . VARICOSE  VEIN SURGERY      Medical History: Past Medical History:  Diagnosis Date  . Anemia   . Anxiety   . Asthma   . Cancer ([place])    T2,   . GERD (gastroesophageal reflux disease)   . Headache     Family History: Family History  Problem Relation Age of Onset  . Breast cancer Mother 108  . Prostate cancer Father       Review of Systems  Constitutional: Negative for activity change, chills, fatigue and unexpected weight change.  HENT: Negative for congestion, postnasal drip, rhinorrhea, sneezing and sore  throat.   Respiratory: Negative for cough, chest tightness, shortness of breath and wheezing.   Cardiovascular: Negative for chest pain and palpitations.       Improved blood pressure today.   Gastrointestinal: Negative for abdominal pain, constipation, diarrhea, nausea and vomiting.  Endocrine: Negative for cold intolerance, heat intolerance, polydipsia and polyuria.  Genitourinary: Negative for dysuria, flank pain and frequency.  Musculoskeletal: Negative for arthralgias, back pain, joint swelling and neck pain.  Skin: Negative for rash.  Allergic/Immunologic: Negative for environmental allergies.  Neurological: Positive for tremors and headaches. Negative for dizziness and numbness.       The patinet reports having several episodes of waking up from deep sleep with uncontrollable shaking.   Hematological: Negative for adenopathy. Does not bruise/bleed easily.  Psychiatric/Behavioral: Negative for behavioral problems (Depression), sleep disturbance and suicidal ideas. The patient is nervous/anxious.     Today's Vitals   12/07/19 0842  BP: 119/89  Pulse: 83  Resp: 16  Temp: 97.7 F (36.5 C)  SpO2: 98%  Weight: 158 lb (71.7 kg)  Height: 5\' 6"  (1.676 m)   Body mass index is 25.5 kg/m.  Physical Exam Vitals and nursing note reviewed.  Constitutional:      General: She is not in acute distress.    Appearance: Normal appearance. She is well-developed. She is not diaphoretic.  HENT:     Head: Normocephalic and atraumatic.     Nose: Nose normal.     Mouth/Throat:     Pharynx: No oropharyngeal exudate.  Eyes:     Pupils: Pupils are equal, round, and reactive to light.  Neck:     Thyroid: No thyromegaly.     Vascular: No carotid bruit or JVD.     Trachea: No tracheal deviation.  Cardiovascular:     Rate and Rhythm: Normal rate and regular rhythm.     Pulses: Normal pulses.     Heart sounds: Normal heart sounds. No murmur heard.  No friction rub. No gallop.   Pulmonary:      Effort: Pulmonary effort is normal. No respiratory distress.     Breath sounds: Normal breath sounds. No wheezing or rales.  Chest:     Chest wall: No tenderness.     Breasts:        Right: Normal. No swelling, bleeding, inverted nipple, mass, nipple discharge, skin change or tenderness.        Left: Normal. No swelling, bleeding, inverted nipple, mass, nipple discharge, skin change or tenderness.  Abdominal:     General: Bowel sounds are normal.     Palpations: Abdomen is soft.     Tenderness: There is no abdominal tenderness.  Genitourinary:    General: Normal vulva.     Labia:        Right: No rash, tenderness or lesion.        Left: No rash, tenderness or lesion.  Vagina: Vaginal discharge present. No erythema, tenderness or bleeding.     Cervix: Discharge present. No friability or erythema.     Uterus: Normal.      Adnexa: Right adnexa normal and left adnexa normal.     Comments: No tenderness, masses, or organomeglay present during bimanual exam . Musculoskeletal:        General: Normal range of motion.     Cervical back: Normal range of motion and neck supple.  Lymphadenopathy:     Cervical: No cervical adenopathy.     Upper Body:     Right upper body: No axillary adenopathy.     Left upper body: No axillary adenopathy.     Lower Body: No right inguinal adenopathy. No left inguinal adenopathy.  Skin:    General: Skin is warm and dry.  Neurological:     General: No focal deficit present.     Mental Status: She is alert and oriented to person, place, and time.     Cranial Nerves: No cranial nerve deficit.  Psychiatric:        Attention and Perception: Attention and perception normal.        Mood and Affect: Affect normal. Mood is anxious.        Speech: Speech normal.        Behavior: Behavior normal. Behavior is cooperative.        Thought Content: Thought content normal.        Cognition and Memory: Cognition and memory normal.        Judgment: Judgment normal.       LABS: Recent Results (from the past 2160 hour(s))  CBC     Status: Abnormal   Collection Time: 12/07/19 10:28 AM  Result Value Ref Range   WBC 3.1 (L) 4.0 - 10.5 K/uL   RBC 4.10 3.87 - 5.11 MIL/uL   Hemoglobin 12.3 12.0 - 15.0 g/dL   HCT 36.1 36 - 46 %   MCV 88.0 80.0 - 100.0 fL   MCH 30.0 26.0 - 34.0 pg   MCHC 34.1 30.0 - 36.0 g/dL   RDW 15.1 11.5 - 15.5 %   Platelets 205 150 - 400 K/uL   nRBC 0.0 0.0 - 0.2 %    Comment: Performed at [facility], [address] [place], [place] [address]  Comprehensive metabolic panel     Status: Abnormal   Collection Time: 12/07/19 10:28 AM  Result Value Ref Range   Sodium 141 135 - 145 mmol/L   Potassium 4.1 3.5 - 5.1 mmol/L   Chloride 106 98 - 111 mmol/L   CO2 27 22 - 32 mmol/L   Glucose, Bld 80 70 - 99 mg/dL    Comment: Glucose reference range applies only to samples taken after fasting for at least 8 hours.   BUN 11 6 - 20 mg/dL   Creatinine, Ser 0.81 0.44 - 1.00 mg/dL   Calcium 8.5 (L) 8.9 - 10.3 mg/dL   Total Protein 7.3 6.5 - 8.1 g/dL   Albumin 3.7 3.5 - 5.0 g/dL   AST 21 15 - 41 U/L   ALT 18 0 - 44 U/L   Alkaline Phosphatase 62 38 - 126 U/L   Total Bilirubin 0.7 0.3 - 1.2 mg/dL   GFR calc non Af Amer >60 >60 mL/min   GFR calc Af Amer >60 >60 mL/min   Anion gap 8 5 - 15    Comment: Performed at [facility], [address] [place], [place]  [address]  Lipid panel     Status: Abnormal   Collection Time: 12/07/19 10:28 AM  Result Value Ref Range   Cholesterol 180 0 - 200 mg/dL   Triglycerides 192 (H) <150 mg/dL   HDL 76 >40 mg/dL   Total CHOL/HDL Ratio 2.4 RATIO   VLDL 38 0 - 40 mg/dL   LDL Cholesterol 66 0 - 99 mg/dL    Comment:        Total Cholesterol/HDL:CHD Risk Coronary Heart Disease Risk Table                     Men   Women  1/2 Average Risk   3.4   3.3  Average Risk       5.0   4.4  2 X Average Risk   9.6   7.1  3 X Average Risk  23.4   11.0        Use the calculated Patient  Ratio above and the CHD Risk Table to determine the patient's CHD Risk.        ATP III CLASSIFICATION (LDL):  <100     mg/dL   Optimal  100-129  mg/dL   Near or Above                    Optimal  130-159  mg/dL   Borderline  160-189  mg/dL   High  >190     mg/dL   Very High Performed at [facility], [address] [place], [place] [address]   TSH     Status: None   Collection Time: 12/07/19 10:28 AM  Result Value Ref Range   TSH 0.573 0.350 - 4.500 uIU/mL    Comment: Performed by a 3rd Generation assay with a functional sensitivity of <=0.01 uIU/mL. Performed at [facility], [address] [place], [place] [address]   T4, free     Status: None   Collection Time: 12/07/19 10:28 AM  Result Value Ref Range   Free T4 0.88 0.61 - 1.12 ng/dL    Comment: (NOTE) Biotin ingestion may interfere with free T4 tests. If the results are inconsistent with the TSH level, previous test results, or the clinical presentation, then consider biotin interference. If needed, order repeat testing after stopping biotin. Performed at [facility], [address] [place], [place] [address]   Folate, serum, performed at [facility] lab     Status: Abnormal   Collection Time: 12/07/19 10:28 AM  Result Value Ref Range   Folate 5.2 (L) >5.9 ng/mL    Comment: Performed at [facility], [address] [place], [place] [address]    Assessment/Plan:  1. Encounter for general adult medical examination with abnormal findings Annual health maintenance exam today with pap smear.   2. Essential hypertension Improved and stable. Continue amlodipine as prescribed  - amLODipine (NORVASC) 2.5 MG tablet; Take 1 tablet (2.5 mg total) by mouth daily.  Dispense: 90 tablet; Refill: 3  3. Mild intermittent asthma, unspecified whether complicated Use rescue inhaler as needed and as prescribed  - PROAIR HFA 108 (90 Base) MCG/ACT inhaler; Inhale 1 puff into the lungs every 4  (four) hours as needed.  Dispense: 18 g; Refill: 5  4. Seizure-like activity ([place]) Unclear etiology. Will get CT head for further evaluation. Refer to neurology as indicated.  - CT Head Wo Contrast; Future  5. 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 Include B12,  folate, and thiamine levels.   6. GAD (generalized anxiety disorder) conitnue buspirone 10mg  up to twice daily as needed for acute anxiety.  - busPIRone (BUSPAR) 10 MG tablet; Take 1 tablet (10 mg total) by mouth 2 (two) times daily.  Dispense: 180 tablet; Refill: 3  7. Atopic dermatitis, mild May use triamcinolone cream twice daily as needed for atopic dermatitis.  - triamcinolone ointment (KENALOG) 0.5 %; Apply 1 application topically 2 (two) times daily.  Dispense: 90 g; Refill: 3  8. Routine cervical smear - IGP, Aptima HPV  9. Dysuria - Urinalysis, Routine w reflex microscopic  General Counseling: Tinzley verbalizes understanding of the findings of todays visit and agrees with plan of treatment. I have discussed any further diagnostic evaluation that may be needed or ordered today. We also reviewed her medications today. she has been encouraged to call the office with any questions or concerns that should arise related to todays visit.    Counseling:  This patient was seen by Leretha Pol FNP Collaboration with Dr Lavera Guise as a part of collaborative care agreement  Orders Placed This Encounter  Procedures  . CT Head Wo Contrast  . Urinalysis, Routine w reflex microscopic    Meds ordered this encounter  Medications  . amLODipine (NORVASC) 2.5 MG tablet    Sig: Take 1 tablet (2.5 mg total) by mouth daily.    Dispense:  90 tablet    Refill:  3    Please fill as 90 day prescription with next fill.    Order Specific Question:   Supervising Provider    Answer:   Lavera Guise [[id number]]  . busPIRone (BUSPAR) 10 MG tablet    Sig: Take 1 tablet (10 mg total) by mouth 2 (two)  times daily.    Dispense:  180 tablet    Refill:  3    Order Specific Question:   Supervising Provider    Answer:   Lavera Guise [[id number]]  . triamcinolone ointment (KENALOG) 0.5 %    Sig: Apply 1 application topically 2 (two) times daily.    Dispense:  90 g    Refill:  3    Order Specific Question:   Supervising Provider    Answer:   Lavera Guise [[id number]]  . PROAIR HFA 108 (90 Base) MCG/ACT inhaler    Sig: Inhale 1 puff into the lungs every 4 (four) hours as needed.    Dispense:  18 g    Refill:  5    Order Specific Question:   Supervising Provider    Answer:   Lavera Guise [[id number]]    Total time spent: 37 Minutes  Time spent includes review of chart, medications, test results, and follow up plan with the patient.     Lavera Guise, MD  Internal Medicine

## 2019-12-07 NOTE — Telephone Encounter (Signed)
At check out patient did not want to schedule 6 month fu. klh

## 2019-12-08 LAB — URINALYSIS, ROUTINE W REFLEX MICROSCOPIC
Bilirubin, UA: NEGATIVE
Glucose, UA: NEGATIVE
Ketones, UA: NEGATIVE
Leukocytes,UA: NEGATIVE
Nitrite, UA: NEGATIVE
Protein,UA: NEGATIVE
RBC, UA: NEGATIVE
Specific Gravity, UA: 1.013 (ref 1.005–1.030)
Urobilinogen, Ur: 0.2 mg/dL (ref 0.2–1.0)
pH, UA: >=9 — AB (ref 5.0–7.5)

## 2019-12-09 NOTE — Progress Notes (Signed)
B12, folate, and vitamin d deficient.

## 2019-12-11 LAB — IGP, APTIMA HPV: HPV Aptima: POSITIVE — AB

## 2019-12-11 LAB — VITAMIN B1: Vitamin B1 (Thiamine): 101.3 nmol/L (ref 66.5–200.0)

## 2019-12-24 ENCOUNTER — Ambulatory Visit: Admission: RE | Admit: 2019-12-24 | Payer: 59 | Source: Ambulatory Visit

## 2020-01-17 ENCOUNTER — Ambulatory Visit: Payer: 59 | Admitting: Nurse Practitioner

## 2020-02-04 ENCOUNTER — Other Ambulatory Visit: Payer: Self-pay

## 2020-02-04 DIAGNOSIS — J4520 Mild intermittent asthma, uncomplicated: Secondary | ICD-10-CM

## 2020-02-04 MED ORDER — ALBUTEROL SULFATE HFA 108 (90 BASE) MCG/ACT IN AERS: 1.0000 | INHALATION_SPRAY | RESPIRATORY_TRACT | 5 refills | Status: DC | PRN

## 2020-06-12 ENCOUNTER — Other Ambulatory Visit: Payer: Self-pay | Admitting: Adult Health

## 2020-06-12 DIAGNOSIS — T7840XS Allergy, unspecified, sequela: Secondary | ICD-10-CM

## 2020-12-21 ENCOUNTER — Other Ambulatory Visit: Payer: Self-pay | Admitting: Nurse Practitioner

## 2020-12-21 DIAGNOSIS — I10 Essential (primary) hypertension: Secondary | ICD-10-CM

## 2021-01-28 ENCOUNTER — Other Ambulatory Visit: Payer: Self-pay | Admitting: Nurse Practitioner

## 2021-01-28 DIAGNOSIS — F411 Generalized anxiety disorder: Secondary | ICD-10-CM

## 2021-01-28 DIAGNOSIS — I10 Essential (primary) hypertension: Secondary | ICD-10-CM

## 2021-03-02 ENCOUNTER — Telehealth: Payer: Self-pay

## 2021-03-02 ENCOUNTER — Encounter: Payer: Self-pay | Admitting: Physician Assistant

## 2021-03-02 ENCOUNTER — Ambulatory Visit: Payer: 59 | Admitting: Physician Assistant

## 2021-03-02 ENCOUNTER — Other Ambulatory Visit: Payer: Self-pay

## 2021-03-02 DIAGNOSIS — J3089 Other allergic rhinitis: Secondary | ICD-10-CM

## 2021-03-02 DIAGNOSIS — R0602 Shortness of breath: Secondary | ICD-10-CM

## 2021-03-02 DIAGNOSIS — R0683 Snoring: Secondary | ICD-10-CM

## 2021-03-02 DIAGNOSIS — R300 Dysuria: Secondary | ICD-10-CM

## 2021-03-02 DIAGNOSIS — M545 Low back pain, unspecified: Secondary | ICD-10-CM

## 2021-03-02 DIAGNOSIS — E559 Vitamin D deficiency, unspecified: Secondary | ICD-10-CM

## 2021-03-02 DIAGNOSIS — J4520 Mild intermittent asthma, uncomplicated: Secondary | ICD-10-CM

## 2021-03-02 DIAGNOSIS — F411 Generalized anxiety disorder: Secondary | ICD-10-CM

## 2021-03-02 DIAGNOSIS — I10 Essential (primary) hypertension: Secondary | ICD-10-CM

## 2021-03-02 DIAGNOSIS — L209 Atopic dermatitis, unspecified: Secondary | ICD-10-CM

## 2021-03-02 DIAGNOSIS — E538 Deficiency of other specified B group vitamins: Secondary | ICD-10-CM

## 2021-03-02 DIAGNOSIS — R5383 Other fatigue: Secondary | ICD-10-CM

## 2021-03-02 DIAGNOSIS — L509 Urticaria, unspecified: Secondary | ICD-10-CM

## 2021-03-02 LAB — POCT URINALYSIS DIPSTICK
Bilirubin, UA: NEGATIVE
Blood, UA: NEGATIVE
Glucose, UA: NEGATIVE
Ketones, UA: NEGATIVE
Leukocytes, UA: NEGATIVE
Nitrite, UA: NEGATIVE
Protein, UA: NEGATIVE
Spec Grav, UA: 1.025 (ref 1.010–1.025)
Urobilinogen, UA: 0.2 E.U./dL
pH, UA: 6 (ref 5.0–8.0)

## 2021-03-02 MED ORDER — HYDROCORTISONE 1 % EX OINT: 1.0000 "application " | TOPICAL_OINTMENT | Freq: Two times a day (BID) | CUTANEOUS | 0 refills | Status: AC

## 2021-03-02 MED ORDER — BUSPIRONE HCL 10 MG PO TABS: 10.0000 mg | ORAL_TABLET | Freq: Two times a day (BID) | ORAL | 3 refills | Status: DC

## 2021-03-02 MED ORDER — MELOXICAM 7.5 MG PO TABS
7.5000 mg | ORAL_TABLET | Freq: Every day | ORAL | 0 refills | Status: DC
Start: 2021-03-02 — End: 2021-03-29

## 2021-03-02 MED ORDER — AMLODIPINE BESYLATE 2.5 MG PO TABS: 2.5000 mg | ORAL_TABLET | Freq: Every day | ORAL | 3 refills | Status: DC

## 2021-03-02 MED ORDER — ALBUTEROL SULFATE HFA 108 (90 BASE) MCG/ACT IN AERS: 1.0000 | INHALATION_SPRAY | RESPIRATORY_TRACT | 5 refills | Status: DC | PRN

## 2021-03-02 MED ORDER — FLUTICASONE PROPIONATE 50 MCG/ACT NA SUSP: 1.0000 | NASAL | 3 refills | Status: AC | PRN

## 2021-03-02 MED ORDER — TRIAMCINOLONE ACETONIDE 0.5 % EX OINT: 1.0000 "application " | TOPICAL_OINTMENT | Freq: Two times a day (BID) | CUTANEOUS | 3 refills | Status: DC

## 2021-03-02 NOTE — Progress Notes (Signed)
[facility] [place], [place] [address]  Internal MEDICINE  Office Visit Note  Patient Name: Briana Snyder  [id number]  [id number]  Date of Service: 03/08/2021  Chief Complaint  Patient presents with   Acute Visit   Back Pain    Hip pain   Shortness of Breath     HPI Pt is here for a sick visit with multiple complaints and refill requests -Sob for months, PFT needed in future. Requesting albuterol refill -No sinus congestion  -Left leg swelling for 1.5 weeks that fluctuates, gets better with elevation. Not much edema on exam. No changes in sensation or color -low back pain recently, unclear exact timeline. Concern she may have slept weirdly, partially due to restless sleep v some arthritis. -Snores at night, gasps/chokes at night and states she is fatigued EPWORTH SLEEPINESS SCALE:  Scale:  (0)= no chance of dozing; (1)= slight chance of dozing; (2)= moderate chance of dozing; (3)= high chance of dozing  Chance  Situtation    Sitting and reading: 0    Watching TV: 3    Sitting Inactive in public: 0    As a passenger in car: 2      Lying down to rest: 0    Sitting and talking: 0    Sitting quielty after lunch: 0    In a car, stopped in traffic: 0   TOTAL SCORE:   5 out of 24    Current Medication:  Outpatient Encounter Medications as of 03/02/2021  Medication Sig   meloxicam (MOBIC) 7.5 MG tablet Take 1 tablet (7.5 mg total) by mouth daily.   [DISCONTINUED] albuterol (PROAIR HFA) 108 (90 Base) MCG/ACT inhaler Inhale 1 puff into the lungs every 4 (four) hours as needed.   [DISCONTINUED] amLODipine (NORVASC) 2.5 MG tablet Take 1 tablet (2.5 mg total) by mouth daily.   [DISCONTINUED] busPIRone (BUSPAR) 10 MG tablet Take 1 tablet (10 mg total) by mouth 2 (two) times daily.   [DISCONTINUED] fluticasone (FLONASE) 50 MCG/ACT nasal spray Place 1 spray into both nostrils as needed.   [DISCONTINUED] hydrocortisone 1 % ointment Apply 1  application topically 2 (two) times daily.   [DISCONTINUED] triamcinolone ointment (KENALOG) 0.5 % Apply 1 application topically 2 (two) times daily.   albuterol (PROAIR HFA) 108 (90 Base) MCG/ACT inhaler Inhale 1 puff into the lungs every 4 (four) hours as needed.   amLODipine (NORVASC) 2.5 MG tablet Take 1 tablet (2.5 mg total) by mouth daily.   busPIRone (BUSPAR) 10 MG tablet Take 1 tablet (10 mg total) by mouth 2 (two) times daily.   fluticasone (FLONASE) 50 MCG/ACT nasal spray Place 1 spray into both nostrils as needed.   hydrocortisone 1 % ointment Apply 1 application topically 2 (two) times daily.   triamcinolone ointment (KENALOG) 0.5 % Apply 1 application topically 2 (two) times daily.   No facility-administered encounter medications on file as of 03/02/2021.      Medical History: Past Medical History:  Diagnosis Date   Anemia    Anxiety    Asthma    Cancer ([facility])    T2,    GERD (gastroesophageal reflux disease)    Headache      Vital Signs: BP 120/80   Pulse 81   Temp 97.8 F (36.6 C)   Resp 16   Ht 5\' 6"  (1.676 m)   Wt 169 lb (76.7 kg)   LMP 02/08/2015   SpO2 99%   BMI 27.28 kg/m    Review  of Systems  Constitutional:  Positive for fatigue. Negative for fever.  HENT:  Negative for congestion, mouth sores and postnasal drip.   Respiratory:  Positive for shortness of breath. Negative for cough and wheezing.   Cardiovascular:  Positive for leg swelling. Negative for chest pain and palpitations.  Genitourinary:  Negative for dysuria and flank pain.  Musculoskeletal:  Positive for arthralgias and back pain.  Psychiatric/Behavioral:  Positive for sleep disturbance.    Physical Exam Vitals and nursing note reviewed.  Constitutional:      General: She is not in acute distress.    Appearance: She is well-developed. She is not diaphoretic.  HENT:     Head: Normocephalic and atraumatic.     Mouth/Throat:     Pharynx: No oropharyngeal exudate.  Eyes:      Pupils: Pupils are equal, round, and reactive to light.  Neck:     Thyroid: No thyromegaly.     Vascular: No JVD.     Trachea: No tracheal deviation.  Cardiovascular:     Rate and Rhythm: Normal rate and regular rhythm.     Heart sounds: Normal heart sounds. No murmur heard.   No friction rub. No gallop.  Pulmonary:     Effort: Pulmonary effort is normal. No respiratory distress.     Breath sounds: No wheezing or rales.  Chest:     Chest wall: No tenderness.  Abdominal:     General: Bowel sounds are normal.     Palpations: Abdomen is soft.  Musculoskeletal:        General: No tenderness. Normal range of motion.     Cervical back: Normal range of motion and neck supple.     Right lower leg: No edema.     Left lower leg: No edema.  Lymphadenopathy:     Cervical: No cervical adenopathy.  Skin:    General: Skin is warm and dry.  Neurological:     Mental Status: She is alert and oriented to person, place, and time.     Cranial Nerves: No cranial nerve deficit.     Motor: No weakness.  Psychiatric:        Behavior: Behavior normal.        Thought Content: Thought content normal.        Judgment: Judgment normal.      Assessment/Plan: 1. Essential hypertension Stable, continue current medication.  Refill sent - amLODipine (NORVASC) 2.5 MG tablet; Take 1 tablet (2.5 mg total) by mouth daily.  Dispense: 90 tablet; Refill: 3  2. Snoring Based on snoring, waking/gasping, daytime sleepiness and fatigue as well as history of high blood pressure and new shortness of breath, will order sleep study for further evaluation - [facility] SLEEP STUDY  3. Acute bilateral low back pain without sciatica Will try meloxicam daily for a few weeks to help with pain management and decrease inflammation.  Patient also advised to try heat or ice do gentle stretching exercises. - meloxicam (MOBIC) 7.5 MG tablet; Take 1 tablet (7.5 mg total) by mouth daily.  Dispense: 30 tablet; Refill: 0  4. GAD  (generalized anxiety disorder) May continue BuSpar - busPIRone (BUSPAR) 10 MG tablet; Take 1 tablet (10 mg total) by mouth 2 (two) times daily.  Dispense: 180 tablet; Refill: 3  5. Mild intermittent asthma, unspecified whether complicated May continue albuterol as needed and we will go ahead and order a PFT - albuterol (PROAIR HFA) 108 (90 Base) MCG/ACT inhaler; Inhale 1 puff into the lungs every 4 (  four) hours as needed.  Dispense: 18 g; Refill: 5 - Pulmonary Function Test; Future  6. Allergic rhinitis due to other allergic trigger, unspecified seasonality - fluticasone (FLONASE) 50 MCG/ACT nasal spray; Place 1 spray into both nostrils as needed.  Dispense: 16 g; Refill: 3  7. SOB (shortness of breath) Based on worsening shortness of breath and lower extremity swelling, will go ahead and order PFT and echo for further evaluation - ECHOCARDIOGRAM COMPLETE; Future - Pulmonary Function Test; Future  8. Atopic dermatitis, mild - triamcinolone ointment (KENALOG) 0.5 %; Apply 1 application topically 2 (two) times daily.  Dispense: 90 g; Refill: 3  9. Vitamin D deficiency - VITAMIN D 25 Hydroxy (Vit-D Deficiency, Fractures)  10. B12 deficiency - B12 and Folate Panel  11. Other fatigue - CBC w/Diff/Platelet - Comprehensive metabolic panel - TSH + free T4 - Lipid Panel With LDL/HDL Ratio  12. Dysuria - POCT Urinalysis Dipstick   General Counseling: Tisa verbalizes understanding of the findings of todays visit and agrees with plan of treatment. I have discussed any further diagnostic evaluation that may be needed or ordered today. We also reviewed her medications today. she has been encouraged to call the office with any questions or concerns that should arise related to todays visit.    Counseling:    Orders Placed This Encounter  Procedures   CBC w/Diff/Platelet   Comprehensive metabolic panel   TSH + free T4   Lipid Panel With LDL/HDL Ratio   VITAMIN D 25 Hydroxy (Vit-D  Deficiency, Fractures)   B12 and Folate Panel   POCT Urinalysis Dipstick   ECHOCARDIOGRAM COMPLETE   Pulmonary Function Test   PSG SLEEP STUDY    Meds ordered this encounter  Medications   amLODipine (NORVASC) 2.5 MG tablet    Sig: Take 1 tablet (2.5 mg total) by mouth daily.    Dispense:  90 tablet    Refill:  3    Please fill as 90 day prescription with next fill.   busPIRone (BUSPAR) 10 MG tablet    Sig: Take 1 tablet (10 mg total) by mouth 2 (two) times daily.    Dispense:  180 tablet    Refill:  3   fluticasone (FLONASE) 50 MCG/ACT nasal spray    Sig: Place 1 spray into both nostrils as needed.    Dispense:  16 g    Refill:  3   hydrocortisone 1 % ointment    Sig: Apply 1 application topically 2 (two) times daily.    Dispense:  56 g    Refill:  0   triamcinolone ointment (KENALOG) 0.5 %    Sig: Apply 1 application topically 2 (two) times daily.    Dispense:  90 g    Refill:  3   albuterol (PROAIR HFA) 108 (90 Base) MCG/ACT inhaler    Sig: Inhale 1 puff into the lungs every 4 (four) hours as needed.    Dispense:  18 g    Refill:  5    Pt can have generic brand   meloxicam (MOBIC) 7.5 MG tablet    Sig: Take 1 tablet (7.5 mg total) by mouth daily.    Dispense:  30 tablet    Refill:  0    Time spent:40 Minutes

## 2021-03-02 NOTE — Telephone Encounter (Signed)
Patient will call back to schedule, pft, u/s and today's follow up appointments-Toni

## 2021-03-09 ENCOUNTER — Telehealth: Payer: Self-pay

## 2021-03-09 ENCOUNTER — Other Ambulatory Visit: Payer: Self-pay | Admitting: Physician Assistant

## 2021-03-09 DIAGNOSIS — J069 Acute upper respiratory infection, unspecified: Secondary | ICD-10-CM

## 2021-03-09 MED ORDER — AZITHROMYCIN 250 MG PO TABS: ORAL_TABLET | ORAL | 0 refills | Status: DC

## 2021-03-09 MED ORDER — PREDNISONE 10 MG PO TABS: ORAL_TABLET | ORAL | 0 refills | Status: DC

## 2021-03-09 NOTE — Telephone Encounter (Signed)
No leg swelling, yes cough and congestion, informed pt meds will be sent to her pharmacy ([facility]) and if SOB gets worse to go to the ED

## 2021-03-11 ENCOUNTER — Ambulatory Visit (INDEPENDENT_AMBULATORY_CARE_PROVIDER_SITE_OTHER): Payer: 59 | Admitting: Internal Medicine

## 2021-03-11 ENCOUNTER — Other Ambulatory Visit: Payer: Self-pay

## 2021-03-11 DIAGNOSIS — R0602 Shortness of breath: Secondary | ICD-10-CM

## 2021-03-11 DIAGNOSIS — J4520 Mild intermittent asthma, uncomplicated: Secondary | ICD-10-CM | POA: Diagnosis not present

## 2021-03-11 LAB — PULMONARY FUNCTION TEST

## 2021-03-15 NOTE — Procedures (Signed)
[facility] [address] [place], [address]    Complete Pulmonary Function Testing Interpretation:  FINDINGS:  The forced vital capacity is moderately decreased.  FEV1 was 1.24 L which is 52% of predicted and is moderately decreased.  FEV1 FVC ratio is mildly decreased.  Postbronchodilator no significant change in FEV1.  Total lung capacity is mildly decreased residual volume is normal residual volume total capacity ratio is increased.  FRC was mildly decreased.  DLCO mildly decreased.  IMPRESSION:  This pulmonary function study is suggestive of moderate obstructive lung disease clinical correlation is recommended  Allyne Gee, MD [facility] Pulmonary Critical Care Medicine Sleep Medicine

## 2021-03-26 ENCOUNTER — Ambulatory Visit: Payer: 59 | Admitting: Internal Medicine

## 2021-03-29 ENCOUNTER — Other Ambulatory Visit: Payer: Self-pay | Admitting: Nurse Practitioner

## 2021-03-29 ENCOUNTER — Other Ambulatory Visit: Payer: Self-pay | Admitting: Physician Assistant

## 2021-03-29 DIAGNOSIS — M545 Low back pain, unspecified: Secondary | ICD-10-CM

## 2021-03-29 DIAGNOSIS — J4520 Mild intermittent asthma, uncomplicated: Secondary | ICD-10-CM

## 2021-03-29 MED ORDER — MELOXICAM 7.5 MG PO TABS: 7.5000 mg | ORAL_TABLET | Freq: Every day | ORAL | 1 refills | Status: DC

## 2021-03-30 ENCOUNTER — Other Ambulatory Visit: Payer: Self-pay

## 2021-03-30 MED ORDER — ALBUTEROL SULFATE 108 (90 BASE) MCG/ACT IN AEPB: INHALATION_SPRAY | RESPIRATORY_TRACT | 3 refills | Status: DC

## 2021-04-15 ENCOUNTER — Other Ambulatory Visit
Admission: RE | Admit: 2021-04-15 | Discharge: 2021-04-15 | Disposition: A | Discharge status: Home / Self Care | Payer: 59 | Attending: Physician Assistant | Admitting: Physician Assistant

## 2021-04-15 DIAGNOSIS — E538 Deficiency of other specified B group vitamins: Secondary | ICD-10-CM | POA: Insufficient documentation

## 2021-04-15 DIAGNOSIS — R300 Dysuria: Secondary | ICD-10-CM | POA: Insufficient documentation

## 2021-04-15 DIAGNOSIS — E559 Vitamin D deficiency, unspecified: Secondary | ICD-10-CM | POA: Insufficient documentation

## 2021-04-15 DIAGNOSIS — R5383 Other fatigue: Secondary | ICD-10-CM | POA: Diagnosis not present

## 2021-04-15 LAB — CBC WITH DIFFERENTIAL/PLATELET
Abs Immature Granulocytes: 0 10*3/uL (ref 0.00–0.07)
Basophils Absolute: 0 10*3/uL (ref 0.0–0.1)
Basophils Relative: 2 %
Eosinophils Absolute: 0.2 10*3/uL (ref 0.0–0.5)
Eosinophils Relative: 8 %
HCT: 39 % (ref 36.0–46.0)
Hemoglobin: 13 g/dL (ref 12.0–15.0)
Immature Granulocytes: 0 %
Lymphocytes Relative: 48 %
Lymphs Abs: 1.2 10*3/uL (ref 0.7–4.0)
MCH: 29.4 pg (ref 26.0–34.0)
MCHC: 33.3 g/dL (ref 30.0–36.0)
MCV: 88.2 fL (ref 80.0–100.0)
Monocytes Absolute: 0.2 10*3/uL (ref 0.1–1.0)
Monocytes Relative: 8 %
Neutro Abs: 0.8 10*3/uL — ABNORMAL LOW (ref 1.7–7.7)
Neutrophils Relative %: 34 %
Platelets: 203 10*3/uL (ref 150–400)
RBC: 4.42 MIL/uL (ref 3.87–5.11)
RDW: 13.9 % (ref 11.5–15.5)
Smear Review: NORMAL
WBC Morphology: ABNORMAL
WBC: 2.4 10*3/uL — ABNORMAL LOW (ref 4.0–10.5)
nRBC: 0 % (ref 0.0–0.2)

## 2021-04-15 LAB — COMPREHENSIVE METABOLIC PANEL
ALT: 16 U/L (ref 0–44)
AST: 17 U/L (ref 15–41)
Albumin: 4 g/dL (ref 3.5–5.0)
Alkaline Phosphatase: 65 U/L (ref 38–126)
Anion gap: 7 (ref 5–15)
BUN: 14 mg/dL (ref 6–20)
CO2: 28 mmol/L (ref 22–32)
Calcium: 8.7 mg/dL — ABNORMAL LOW (ref 8.9–10.3)
Chloride: 106 mmol/L (ref 98–111)
Creatinine, Ser: 0.79 mg/dL (ref 0.44–1.00)
GFR, Estimated: >60 mL/min (ref >60)
Glucose, Bld: 85 mg/dL (ref 70–99)
Potassium: 4.1 mmol/L (ref 3.5–5.1)
Sodium: 141 mmol/L (ref 135–145)
Total Bilirubin: 0.4 mg/dL (ref 0.3–1.2)
Total Protein: 7.4 g/dL (ref 6.5–8.1)

## 2021-04-15 LAB — LIPID PANEL
Cholesterol: 183 mg/dL (ref 0–200)
HDL: 61 mg/dL (ref >40)
LDL Cholesterol: 69 mg/dL (ref 0–99)
Total CHOL/HDL Ratio: 3 RATIO
Triglycerides: 263 mg/dL — ABNORMAL HIGH (ref <150)
VLDL: 53 mg/dL — ABNORMAL HIGH (ref 0–40)

## 2021-04-15 LAB — TSH: TSH: 1.161 u[IU]/mL (ref 0.350–4.500)

## 2021-04-15 LAB — FOLATE: Folate: 8.9 ng/mL (ref >5.9)

## 2021-04-15 LAB — VITAMIN B12: Vitamin B-12: 165 pg/mL — ABNORMAL LOW (ref 180–914)

## 2021-04-15 LAB — T4, FREE: Free T4: 0.62 ng/dL (ref 0.61–1.12)

## 2021-04-15 LAB — LDL CHOLESTEROL, DIRECT: Direct LDL: 76.3 mg/dL (ref 0–99)

## 2021-04-15 LAB — VITAMIN D 25 HYDROXY (VIT D DEFICIENCY, FRACTURES): Vit D, 25-Hydroxy: 23.4 ng/mL — ABNORMAL LOW (ref 30–100)

## 2021-04-20 ENCOUNTER — Other Ambulatory Visit: Payer: Self-pay

## 2021-04-20 ENCOUNTER — Encounter: Payer: Self-pay | Admitting: Physician Assistant

## 2021-04-20 ENCOUNTER — Telehealth: Payer: Self-pay

## 2021-04-20 ENCOUNTER — Ambulatory Visit (INDEPENDENT_AMBULATORY_CARE_PROVIDER_SITE_OTHER): Payer: 59 | Admitting: Physician Assistant

## 2021-04-20 DIAGNOSIS — R7989 Other specified abnormal findings of blood chemistry: Secondary | ICD-10-CM

## 2021-04-20 DIAGNOSIS — E782 Mixed hyperlipidemia: Secondary | ICD-10-CM

## 2021-04-20 DIAGNOSIS — I10 Essential (primary) hypertension: Secondary | ICD-10-CM | POA: Diagnosis not present

## 2021-04-20 DIAGNOSIS — F411 Generalized anxiety disorder: Secondary | ICD-10-CM | POA: Diagnosis not present

## 2021-04-20 DIAGNOSIS — Z01419 Encounter for gynecological examination (general) (routine) without abnormal findings: Secondary | ICD-10-CM

## 2021-04-20 DIAGNOSIS — Z1231 Encounter for screening mammogram for malignant neoplasm of breast: Secondary | ICD-10-CM

## 2021-04-20 DIAGNOSIS — Z0001 Encounter for general adult medical examination with abnormal findings: Secondary | ICD-10-CM

## 2021-04-20 DIAGNOSIS — Z111 Encounter for screening for respiratory tuberculosis: Secondary | ICD-10-CM

## 2021-04-20 DIAGNOSIS — E538 Deficiency of other specified B group vitamins: Secondary | ICD-10-CM | POA: Diagnosis not present

## 2021-04-20 DIAGNOSIS — R300 Dysuria: Secondary | ICD-10-CM

## 2021-04-20 DIAGNOSIS — J449 Chronic obstructive pulmonary disease, unspecified: Secondary | ICD-10-CM | POA: Diagnosis not present

## 2021-04-20 DIAGNOSIS — J4520 Mild intermittent asthma, uncomplicated: Secondary | ICD-10-CM

## 2021-04-20 MED ORDER — CYANOCOBALAMIN 1000 MCG/ML IJ SOLN
1000.0000 ug | Freq: Once | INTRAMUSCULAR | Status: AC
Start: 2021-04-20 — End: 2021-04-20
  Administered 2021-04-20: 12:00:00 1000 ug via INTRAMUSCULAR

## 2021-04-20 MED ORDER — ZOSTER VAC RECOMB ADJUVANTED 50 MCG/0.5ML IM SUSR
0.5000 mL | Freq: Once | INTRAMUSCULAR | 0 refills | Status: AC
  Filled 2021-04-20: qty 0.5, 1d supply, fill #0

## 2021-04-20 MED ORDER — ROSUVASTATIN CALCIUM 5 MG PO TABS: ORAL_TABLET | ORAL | 0 refills | Status: DC

## 2021-04-20 MED ORDER — SPIRIVA HANDIHALER 18 MCG IN CAPS
18.0000 ug | ORAL_CAPSULE | Freq: Every day | RESPIRATORY_TRACT | 12 refills | Status: DC
Start: 2021-04-20 — End: 2021-06-01

## 2021-04-20 MED ORDER — ALBUTEROL SULFATE (2.5 MG/3ML) 0.083% IN NEBU: 2.5000 mg | INHALATION_SOLUTION | Freq: Four times a day (QID) | RESPIRATORY_TRACT | 12 refills | Status: AC | PRN

## 2021-04-20 MED ORDER — ALBUTEROL SULFATE HFA 108 (90 BASE) MCG/ACT IN AERS
2.0000 | INHALATION_SPRAY | Freq: Four times a day (QID) | RESPIRATORY_TRACT | 0 refills | Status: DC | PRN
Start: 2021-04-20 — End: 2022-03-31

## 2021-04-20 MED ORDER — CYANOCOBALAMIN 1000 MCG/ML IJ SOLN: INTRAMUSCULAR | 3 refills | Status: DC

## 2021-04-20 MED ORDER — PNEUMOCOCCAL 13-VAL CONJ VACC IM SUSP
0.5000 mL | INTRAMUSCULAR | 0 refills | Status: AC
  Filled 2021-04-20: qty 0.5, 1d supply, fill #0

## 2021-04-20 NOTE — Progress Notes (Signed)
[facility] [place], [place] [address]  Internal MEDICINE  Office Visit Note  Patient Name: Briana Snyder  [id number]  [id number]  Date of Service: 04/22/2021  Chief Complaint  Patient presents with   Annual Exam   Anxiety   Hypertension     HPI Pt is here for routine health maintenance examination -B12 low, vit D low, low calcium, low WBC and neutrophils -Discussed starting B12 shots and states she used to get these a long time ago -Started taking vit D OTC 2 weeks ago and will continue and will add calcium supplement as well -Did have elevated TG but overall cholesterol ok, pt would like to start medication to help keep this controlled and will start low dose crestor twice per week  -Due to abnormal WBC will also refer to hematology for further evaluation -Does request albuterol inhaler, would like alternative type to her current one -Stopped smoking 2013, does still get SOB -Had her PFT but unfortunately missed her pulmonary appt after. Discussed results did show mod COPD and will start maintenance inhaler today and have her follow up with pulm in future to monitor this. Pt is agreeable with plan -States her pain is much better since starting mobic as needed -needs TB test and would like to go for the Tb quant test at hospital, vacc[facility] -pt will figure out if colonoscopy indicated, last was in 2016 and states she may need to follow up sooner than 10 years given her hx of right colectomy for adenocarcinoma and was referred for repeat in 2021 by gen surgery. She will follow up on this. 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   albuterol (VENTOLIN HFA) 108 (90 Base) MCG/ACT inhaler Inhale 2 puffs into the lungs every 6 (six) hours as needed for wheezing or shortness of breath.   amLODipine (NORVASC) 2.5 MG tablet Take 1 tablet (2.5 mg total) by mouth daily.   busPIRone (BUSPAR) 10 MG tablet Take 1 tablet (10 mg total) by mouth 2 (two) times daily.   cyanocobalamin (,VITAMIN B-12,) 1000 MCG/ML injection Inject once a week for 3 weeks then once a month   fluticasone (FLONASE) 50 MCG/ACT nasal spray Place 1 spray into both nostrils as needed.   hydrocortisone 1 % ointment Apply 1 application topically 2 (two) times daily.   meloxicam (MOBIC) 7.5 MG tablet Take 1 tablet (7.5 mg total) by mouth daily.   predniSONE (DELTASONE) 10 MG tablet Take one tab 3 x day for 3 days, then take one tab 2 x a day for 3 days and then take one tab a day for 3 days for URI   rosuvastatin (CRESTOR) 5 MG tablet Take 1 tab by mouth twice per week before bed   tiotropium (SPIRIVA HANDIHALER) 18 MCG inhalation capsule Place 1 capsule (18 mcg total) into inhaler and inhale daily.   triamcinolone ointment (KENALOG) 0.5 % Apply 1 application topically 2 (two) times daily.   [DISCONTINUED] Albuterol Sulfate (PROAIR RESPICLICK) 637 (90 Base) MCG/ACT AEPB INHALE 1 PUFF INTO THE LUNGS EVERY 4 HOURS AS NEEDED   [DISCONTINUED] azithromycin (ZITHROMAX) 250 MG  tablet Take one tab a day for 10 days for uri   [DISCONTINUED] pneumococcal 13-valent conjugate vaccine (PREVNAR 13) SUSP injection Inject 0.5 mLs into the muscle tomorrow at 10 am.   [DISCONTINUED] Zoster Vaccine Adjuvanted [facility]) injection Inject 0.5 mLs into the muscle once.   [EXPIRED] pneumococcal 13-valent conjugate vaccine (PREVNAR 13) SUSP injection Inject 0.5 mLs into the muscle tomorrow at 10 am for 1 dose.   [EXPIRED] Zoster Vaccine Adjuvanted [facility]) injection Inject 0.5 mLs into the muscle once for 1 dose.   [EXPIRED]  cyanocobalamin ((VITAMIN B-12)) injection 1,000 mcg    No facility-administered encounter medications on file as of 04/20/2021.    Surgical History: Past Surgical History:  Procedure Laterality Date   BREAST BIOPSY Left 2012   FIBROADENOMA, 1.0 CM.    BREAST LUMPECTOMY Left 2012   COLONOSCOPY N/A 01/28/2015   Procedure: COLONOSCOPY;  Surgeon: Christene Lye, MD;  Location: [facility] ENDOSCOPY;  Service: Endoscopy;  Laterality: N/A;   endometrial polyps  08/2010   resected   HERNIA REPAIR  2000   inguinal    LAPAROSCOPIC RIGHT HEMI COLECTOMY Right 02/25/2015   Procedure: LAPAROSCOPIC RIGHT HEMI COLECTOMY;  Surgeon: Christene Lye, MD;  Location: [facility] ORS;  Service: General;  Laterality: Right;   THYROIDECTOMY  1987   Right   VARICOSE VEIN SURGERY      Medical History: Past Medical History:  Diagnosis Date   Anemia    Anxiety    Asthma    Cancer ([place])    T2,    GERD (gastroesophageal reflux disease)    Headache    Hypertension     Family History: Family History  Problem Relation Age of Onset   Breast cancer Mother 19   Prostate cancer Father       Review of Systems  Constitutional:  Positive for fatigue. Negative for chills and unexpected weight change.  HENT:  Negative for congestion, postnasal drip, rhinorrhea, sneezing and sore throat.   Eyes:  Negative for redness.  Respiratory:  Positive for shortness of breath. Negative for cough, chest tightness and wheezing.   Cardiovascular:  Negative for chest pain and palpitations.  Gastrointestinal:  Negative for abdominal pain, constipation, diarrhea, nausea and vomiting.  Genitourinary:  Negative for dysuria and frequency.  Musculoskeletal:  Negative for arthralgias, back pain, joint swelling and neck pain.  Skin:  Negative for rash.  Neurological: Negative.  Negative for tremors and numbness.  Hematological:  Negative for adenopathy. Does not bruise/bleed easily.  Psychiatric/Behavioral:  Negative for  behavioral problems (Depression), sleep disturbance and suicidal ideas. The patient is not nervous/anxious.     Vital Signs: BP 124/90   Pulse 81   Temp 97.8 F (36.6 C)   Resp 16   Ht 5' 6.5" (1.689 m)   Wt 175 lb (79.4 kg)   LMP 02/08/2015   SpO2 99%   BMI 27.82 kg/m    Physical Exam Vitals and nursing note reviewed.  Constitutional:      General: She is not in acute distress.    Appearance: She is well-developed. She is not diaphoretic.  HENT:     Head: Normocephalic and atraumatic.     Right Ear: External ear normal.     Left Ear: External ear normal.     Nose: Nose normal.     Mouth/Throat:     Pharynx: No oropharyngeal exudate.  Eyes:     General: No scleral icterus.       Right eye: No discharge.  Left eye: No discharge.     Conjunctiva/sclera: Conjunctivae normal.     Pupils: Pupils are equal, round, and reactive to light.  Neck:     Thyroid: No thyromegaly.     Vascular: No JVD.     Trachea: No tracheal deviation.  Cardiovascular:     Rate and Rhythm: Normal rate and regular rhythm.     Heart sounds: Normal heart sounds. No murmur heard.   No friction rub. No gallop.  Pulmonary:     Effort: Pulmonary effort is normal. No respiratory distress.     Breath sounds: Normal breath sounds. No stridor. No wheezing or rales.  Chest:     Chest wall: No tenderness.  Breasts:    Right: Normal. No mass.     Left: Normal. No mass.  Abdominal:     General: Bowel sounds are normal. There is no distension.     Palpations: Abdomen is soft. There is no mass.     Tenderness: There is no abdominal tenderness. There is no guarding or rebound.  Musculoskeletal:        General: No tenderness or deformity. Normal range of motion.     Cervical back: Normal range of motion and neck supple.  Lymphadenopathy:     Cervical: No cervical adenopathy.  Skin:    General: Skin is warm and dry.     Coloration: Skin is not pale.     Findings: No erythema or rash.   Neurological:     Mental Status: She is alert.     Cranial Nerves: No cranial nerve deficit.     Motor: No abnormal muscle tone.     Coordination: Coordination normal.     Deep Tendon Reflexes: Reflexes are normal and symmetric.  Psychiatric:        Behavior: Behavior normal.        Thought Content: Thought content normal.        Judgment: Judgment normal.     LABS: Recent Results (from the past 2160 hour(s))  POCT Urinalysis Dipstick     Status: None   Collection Time: 03/02/21 12:16 PM  Result Value Ref Range   Color, UA     Clarity, UA     Glucose, UA Negative Negative   Bilirubin, UA Negative    Ketones, UA Negative    Spec Grav, UA 1.025 1.010 - 1.025   Blood, UA Negative    pH, UA 6.0 5.0 - 8.0   Protein, UA Negative Negative   Urobilinogen, UA 0.2 0.2 or 1.0 E.U./dL   Nitrite, UA Negative    Leukocytes, UA Negative Negative   Appearance     Odor    Vitamin B12     Status: Abnormal   Collection Time: 04/15/21  9:46 AM  Result Value Ref Range   Vitamin B-12 165 (L) 180 - 914 pg/mL    Comment: (NOTE) This assay is not validated for testing neonatal or myeloproliferative syndrome specimens for Vitamin B12 levels. Performed at [facility], [place] [address], [place],  [address]   VITAMIN D 25 Hydroxy (Vit-D Deficiency, Fractures)     Status: Abnormal   Collection Time: 04/15/21  9:48 AM  Result Value Ref Range   Vit D, 25-Hydroxy 23.40 (L) 30 - 100 ng/mL    Comment: (NOTE) Vitamin D deficiency has been defined by the [place] practice guideline as a level of serum 25-OH  vitamin D less than 20 ng/mL (1,2).  The Endocrine Society went on to  further define vitamin D insufficiency as a level between 21 and 29  ng/mL (2).  1. IOM (Institute of Medicine). 2010. Dietary reference intakes for  calcium and D. [place]: The [employer]. 2. Holick MF, Binkley Lafe, Bischoff-Ferrari HA, et al.  Evaluation,  treatment, and prevention of vitamin D deficiency: an Endocrine  Society clinical practice guideline, JCEM. 2011 Jul; 96(7): 1911-30.  Performed at [facility], [place] [address] [place], [place] [address]   Lipid panel     Status: Abnormal   Collection Time: 04/15/21  9:48 AM  Result Value Ref Range   Cholesterol 183 0 - 200 mg/dL   Triglycerides 263 (H) <150 mg/dL   HDL 61 >40 mg/dL   Total CHOL/HDL Ratio 3.0 RATIO   VLDL 53 (H) 0 - 40 mg/dL   LDL Cholesterol 69 0 - 99 mg/dL    Comment:        Total Cholesterol/HDL:CHD Risk Coronary Heart Disease Risk Table                     Men   Women  1/2 Average Risk   3.4   3.3  Average Risk       5.0   4.4  2 X Average Risk   9.6   7.1  3 X Average Risk  23.4   11.0        Use the calculated Patient Ratio above and the CHD Risk Table to determine the patient's CHD Risk.        ATP III CLASSIFICATION (LDL):  <100     mg/dL   Optimal  100-129  mg/dL   Near or Above                    Optimal  130-159  mg/dL   Borderline  160-189  mg/dL   High  >190     mg/dL   Very High Performed at [facility], [place], [place] [address]   LDL cholesterol, direct     Status: None   Collection Time: 04/15/21  9:48 AM  Result Value Ref Range   Direct LDL 76.3 0 - 99 mg/dL    Comment: Performed at [facility] [address] [place], [place] [address]  TSH     Status: None   Collection Time: 04/15/21  9:48 AM  Result Value Ref Range   TSH 1.161 0.350 - 4.500 uIU/mL    Comment: Performed by a 3rd Generation assay with a functional sensitivity of <=0.01 uIU/mL. Performed at [facility], [address] [place], [place] [address]   T4, free     Status: None   Collection Time: 04/15/21  9:48 AM  Result Value Ref Range   Free T4 0.62 0.61 - 1.12 ng/dL    Comment: (NOTE) Biotin ingestion may interfere with free T4 tests. If the results are inconsistent with the TSH level, previous  test results, or the clinical presentation, then consider biotin interference. If needed, order repeat testing after stopping biotin. Performed at [facility], [address] [place], [place] [address]   Comprehensive metabolic panel     Status: Abnormal   Collection Time: 04/15/21  9:48 AM  Result Value Ref Range   Sodium 141 135 - 145 mmol/L   Potassium 4.1 3.5 - 5.1 mmol/L   Chloride 106 98 - 111 mmol/L   CO2  28 22 - 32 mmol/L   Glucose, Bld 85 70 - 99 mg/dL    Comment: Glucose reference range applies only to samples taken after fasting for at least 8 hours.   BUN 14 6 - 20 mg/dL   Creatinine, Ser 0.79 0.44 - 1.00 mg/dL   Calcium 8.7 (L) 8.9 - 10.3 mg/dL   Total Protein 7.4 6.5 - 8.1 g/dL   Albumin 4.0 3.5 - 5.0 g/dL   AST 17 15 - 41 U/L   ALT 16 0 - 44 U/L   Alkaline Phosphatase 65 38 - 126 U/L   Total Bilirubin 0.4 0.3 - 1.2 mg/dL   GFR, Estimated >60 >60 mL/min    Comment: (NOTE) Calculated using the CKD-EPI Creatinine Equation (2021)    Anion gap 7 5 - 15    Comment: Performed at [facility], [address] [place], [place] [address]  CBC with Differential/Platelet     Status: Abnormal   Collection Time: 04/15/21  9:48 AM  Result Value Ref Range   WBC 2.4 (L) 4.0 - 10.5 K/uL   RBC 4.42 3.87 - 5.11 MIL/uL   Hemoglobin 13.0 12.0 - 15.0 g/dL   HCT 39.0 36.0 - 46.0 %   MCV 88.2 80.0 - 100.0 fL   MCH 29.4 26.0 - 34.0 pg   MCHC 33.3 30.0 - 36.0 g/dL   RDW 13.9 11.5 - 15.5 %   Platelets 203 150 - 400 K/uL   nRBC 0.0 0.0 - 0.2 %   Neutrophils Relative % 34 %   Neutro Abs 0.8 (L) 1.7 - 7.7 K/uL   Lymphocytes Relative 48 %   Lymphs Abs 1.2 0.7 - 4.0 K/uL   Monocytes Relative 8 %   Monocytes Absolute 0.2 0.1 - 1.0 K/uL   Eosinophils Relative 8 %   Eosinophils Absolute 0.2 0.0 - 0.5 K/uL   Basophils Relative 2 %   Basophils Absolute 0.0 0.0 - 0.1 K/uL   WBC Morphology Abnormal lymphocytes present    RBC Morphology MORPHOLOGY UNREMARKABLE     Smear Review Normal platelet morphology    Immature Granulocytes 0 %   Abs Immature Granulocytes 0.00 0.00 - 0.07 K/uL    Comment: Performed at [facility], [address] [place], [place] [address]  Folate, serum, performed at [facility] lab     Status: None   Collection Time: 04/15/21  9:48 AM  Result Value Ref Range   Folate 8.9 >5.9 ng/mL    Comment: Performed at [facility], [address] [place], [place] [address]  UA/M w/rflx Culture, Routine     Status: Abnormal   Collection Time: 04/20/21  4:34 PM   Specimen: Urine   Urine  Result Value Ref Range   Specific Gravity, UA 1.022 1.005 - 1.030   pH, UA 6.0 5.0 - 7.5   Color, UA Yellow Yellow   Appearance Ur Cloudy (A) Clear   Leukocytes,UA Negative Negative   Protein,UA Negative Negative/Trace   Glucose, UA Negative Negative   Ketones, UA Negative Negative   RBC, UA Negative Negative   Bilirubin, UA Negative Negative   Urobilinogen, Ur 0.2 0.2 - 1.0 mg/dL   Nitrite, UA Negative Negative   Microscopic Examination Comment     Comment: Microscopic follows if indicated.   Microscopic Examination See below:     Comment: Microscopic was indicated and was performed.   Urinalysis Reflex Comment     Comment: This specimen will not reflex to a Urine Culture.  Microscopic Examination     Status: None   Collection Time: 04/20/21  4:34 PM   Urine  Result Value Ref Range   WBC, UA 0-5 0 - 5 /hpf   RBC None seen 0 - 2 /hpf   Epithelial Cells (non renal) 0-10 0 - 10 /hpf   Casts None seen None seen /lpf   Bacteria, UA Few None seen/Few       Assessment/Plan: 1. Encounter for general adult medical examination with abnormal findings CPE performed, labs reviewed, mammogram ordered. Will need to discuss pap in future due to hx of HPV  2. Essential hypertension Well controlled, continue current medication  3. GAD (generalized anxiety disorder) Stable, may continue buspar  4. Mild intermittent asthma,  unspecified whether complicated May continue inhaler as needed  5. Chronic obstructive pulmonary disease, unspecified COPD type ([place]) Will start on spiriva for maintenance and use albuterol as needed. Pt will need to establish with pulmonology and may need step up therapy pending response to inhaler. Pt educated on how to use inhaler - albuterol (VENTOLIN HFA) 108 (90 Base) MCG/ACT inhaler; Inhale 2 puffs into the lungs every 6 (six) hours as needed for wheezing or shortness of breath.  Dispense: 8 g; Refill: 0 - tiotropium (SPIRIVA HANDIHALER) 18 MCG inhalation capsule; Place 1 capsule (18 mcg total) into inhaler and inhale daily.  Dispense: 90 capsule; Refill: 12  6. Abnormal CBC Chronically low WBC with low neutrophil, will refer to hematology for further evaluation - Ambulatory referral to Hematology / Oncology  7. Screening-pulmonary TB - QuantiFERON-TB Gold Plus  8. B12 deficiency - cyanocobalamin ((VITAMIN B-12)) injection 1,000 mcg  9. Mixed hyperlipidemia - rosuvastatin (CRESTOR) 5 MG tablet; Take 1 tab by mouth twice per week before bed  Dispense: 90 tablet; Refill: 0  10. Visit for screening mammogram - MM 3D SCREEN BREAST BILATERAL; Future  11. Visit for gynecologic examination Breast exam performed, will need to discuss pap in future  12. Dysuria - UA/M w/rflx Culture, Routine   General Counseling: Briana Snyder verbalizes understanding of the findings of todays visit and agrees with plan of treatment. I have discussed any further diagnostic evaluation that may be needed or ordered today. We also reviewed her medications today. she has been encouraged to call the office with any questions or concerns that should arise related to todays visit.    Counseling:    Orders Placed This Encounter  Procedures   Microscopic Examination   MM 3D SCREEN BREAST BILATERAL   UA/M w/rflx Culture, Routine   QuantiFERON-TB Gold Plus   Ambulatory referral to Hematology / Oncology     Meds ordered this encounter  Medications   rosuvastatin (CRESTOR) 5 MG tablet    Sig: Take 1 tab by mouth twice per week before bed    Dispense:  90 tablet    Refill:  0   albuterol (VENTOLIN HFA) 108 (90 Base) MCG/ACT inhaler    Sig: Inhale 2 puffs into the lungs every 6 (six) hours as needed for wheezing or shortness of breath.    Dispense:  8 g    Refill:  0   tiotropium (SPIRIVA HANDIHALER) 18 MCG inhalation capsule    Sig: Place 1 capsule (18 mcg total) into inhaler and inhale daily.    Dispense:  90 capsule    Refill:  12   pneumococcal 13-valent conjugate vaccine (PREVNAR 13) SUSP injection    Sig: Inject 0.5 mLs into the muscle tomorrow at 10 am for 1 dose.  Dispense:  0.5 mL    Refill:  0   Zoster Vaccine Adjuvanted [facility]) injection    Sig: Inject 0.5 mLs into the muscle once for 1 dose.    Dispense:  0.5 mL    Refill:  0   cyanocobalamin ((VITAMIN B-12)) injection 1,000 mcg   albuterol (PROVENTIL) (2.5 MG/3ML) 0.083% nebulizer solution    Sig: Take 3 mLs (2.5 mg total) by nebulization every 6 (six) hours as needed for wheezing or shortness of breath.    Dispense:  360 mL    Refill:  12   cyanocobalamin (,VITAMIN B-12,) 1000 MCG/ML injection    Sig: Inject once a week for 3 weeks then once a month    Dispense:  4 mL    Refill:  3    This patient was seen by Drema Dallas, PA-C in collaboration with Dr. Clayborn Bigness as a part of collaborative care agreement.  Total time spent:40 Minutes  Time spent includes review of chart, medications, test results, and follow up plan with the patient.     Lavera Guise, MD  Internal Medicine

## 2021-04-20 NOTE — Telephone Encounter (Signed)
Patient will call back to schedule new patient appointment with dsk. She did not want to schedule today-Toni

## 2021-04-21 ENCOUNTER — Other Ambulatory Visit
Admission: RE | Admit: 2021-04-21 | Discharge: 2021-04-21 | Disposition: A | Discharge status: Home / Self Care | Payer: 59 | Attending: Physician Assistant | Admitting: Physician Assistant

## 2021-04-21 DIAGNOSIS — Z111 Encounter for screening for respiratory tuberculosis: Secondary | ICD-10-CM | POA: Insufficient documentation

## 2021-04-21 LAB — UA/M W/RFLX CULTURE, ROUTINE
Bilirubin, UA: NEGATIVE
Glucose, UA: NEGATIVE
Ketones, UA: NEGATIVE
Leukocytes,UA: NEGATIVE
Nitrite, UA: NEGATIVE
Protein,UA: NEGATIVE
RBC, UA: NEGATIVE
Specific Gravity, UA: 1.022 (ref 1.005–1.030)
Urobilinogen, Ur: 0.2 mg/dL (ref 0.2–1.0)
pH, UA: 6 (ref 5.0–7.5)

## 2021-04-21 LAB — MICROSCOPIC EXAMINATION
Casts: NONE SEEN /lpf
RBC, Urine: NONE SEEN /hpf (ref 0–2)

## 2021-04-23 LAB — QUANTIFERON-TB GOLD PLUS: QuantiFERON-TB Gold Plus: NEGATIVE

## 2021-04-23 LAB — QUANTIFERON-TB GOLD PLUS (RQFGPL)
QuantiFERON Mitogen Value: >10 IU/mL
QuantiFERON Nil Value: 0.01 IU/mL
QuantiFERON TB1 Ag Value: 0 IU/mL
QuantiFERON TB2 Ag Value: 0 IU/mL

## 2021-04-24 ENCOUNTER — Telehealth: Payer: Self-pay

## 2021-04-24 NOTE — Telephone Encounter (Signed)
-----   Message from Mylinda Latina, PA-C sent at 04/24/2021  1:08 PM EST ----- Please let her know her TB test was negative

## 2021-04-26 ENCOUNTER — Telehealth: Payer: Self-pay

## 2021-04-26 NOTE — Telephone Encounter (Signed)
PA for [facility] HANDIHALER sent 04/26/21 @ 645pm

## 2021-04-28 ENCOUNTER — Other Ambulatory Visit: Payer: Self-pay

## 2021-04-28 DIAGNOSIS — I10 Essential (primary) hypertension: Secondary | ICD-10-CM

## 2021-04-28 MED ORDER — AMLODIPINE BESYLATE 2.5 MG PO TABS: 2.5000 mg | ORAL_TABLET | Freq: Every day | ORAL | 3 refills | Status: DC

## 2021-04-29 ENCOUNTER — Other Ambulatory Visit: Payer: Self-pay | Admitting: Nurse Practitioner

## 2021-04-29 DIAGNOSIS — I10 Essential (primary) hypertension: Secondary | ICD-10-CM

## 2021-05-11 ENCOUNTER — Other Ambulatory Visit: Payer: Self-pay

## 2021-05-11 ENCOUNTER — Ambulatory Visit (INDEPENDENT_AMBULATORY_CARE_PROVIDER_SITE_OTHER): Payer: 59

## 2021-05-11 DIAGNOSIS — E538 Deficiency of other specified B group vitamins: Secondary | ICD-10-CM | POA: Diagnosis not present

## 2021-05-11 MED ORDER — CYANOCOBALAMIN 1000 MCG/ML IJ SOLN
1000.0000 ug | Freq: Once | INTRAMUSCULAR | Status: AC
Start: 2021-05-11 — End: 2021-05-11
  Administered 2021-05-11: 09:00:00 1000 ug via INTRAMUSCULAR

## 2021-05-15 ENCOUNTER — Ambulatory Visit (INDEPENDENT_AMBULATORY_CARE_PROVIDER_SITE_OTHER): Payer: 59

## 2021-05-15 ENCOUNTER — Other Ambulatory Visit: Payer: Self-pay

## 2021-05-15 DIAGNOSIS — E538 Deficiency of other specified B group vitamins: Secondary | ICD-10-CM | POA: Diagnosis not present

## 2021-05-15 MED ORDER — CYANOCOBALAMIN 1000 MCG/ML IJ SOLN
1000.0000 ug | Freq: Once | INTRAMUSCULAR | Status: DC
Start: 2021-05-15 — End: 2021-05-15

## 2021-05-15 MED ORDER — CYANOCOBALAMIN 1000 MCG/ML IJ SOLN
1000.0000 ug | Freq: Once | INTRAMUSCULAR | Status: AC
  Administered 2021-05-15: 14:00:00 1000 ug via INTRAMUSCULAR

## 2021-05-28 ENCOUNTER — Telehealth: Payer: Self-pay

## 2021-05-28 ENCOUNTER — Encounter: Payer: Self-pay | Admitting: *Deleted

## 2021-05-28 NOTE — Telephone Encounter (Signed)
PA for [facility] HANDIHALER was sent and came back.....04/26/21  Your PA has been resolved, no additional PA is required. For further inquiries please contact the number on the back of the member prescription card. (Message [phone])

## 2021-05-29 ENCOUNTER — Other Ambulatory Visit: Payer: Self-pay

## 2021-05-29 ENCOUNTER — Ambulatory Visit (INDEPENDENT_AMBULATORY_CARE_PROVIDER_SITE_OTHER): Payer: 59

## 2021-05-29 DIAGNOSIS — E538 Deficiency of other specified B group vitamins: Secondary | ICD-10-CM | POA: Diagnosis not present

## 2021-05-29 MED ORDER — CYANOCOBALAMIN 1000 MCG/ML IJ SOLN
1000.0000 ug | Freq: Once | INTRAMUSCULAR | Status: AC
  Administered 2021-05-29: 1000 ug via INTRAMUSCULAR

## 2021-06-01 ENCOUNTER — Other Ambulatory Visit: Payer: Self-pay

## 2021-06-01 ENCOUNTER — Encounter: Payer: Self-pay | Admitting: Internal Medicine

## 2021-06-01 ENCOUNTER — Encounter: Payer: Self-pay | Admitting: Oncology

## 2021-06-01 ENCOUNTER — Inpatient Hospital Stay: Payer: 59 | Attending: Oncology | Admitting: Oncology

## 2021-06-01 ENCOUNTER — Ambulatory Visit (INDEPENDENT_AMBULATORY_CARE_PROVIDER_SITE_OTHER): Payer: 59 | Admitting: Internal Medicine

## 2021-06-01 ENCOUNTER — Inpatient Hospital Stay: Payer: 59

## 2021-06-01 VITALS — BP 131/87 | HR 82 | Temp 98.0°F | Resp 18 | Wt 172.7 lb

## 2021-06-01 VITALS — BP 130/90 | HR 100 | Temp 98.0°F | Resp 16 | Ht 66.5 in | Wt 173.0 lb

## 2021-06-01 DIAGNOSIS — F1090 Alcohol use, unspecified, uncomplicated: Secondary | ICD-10-CM | POA: Diagnosis not present

## 2021-06-01 DIAGNOSIS — D709 Neutropenia, unspecified: Secondary | ICD-10-CM | POA: Diagnosis not present

## 2021-06-01 DIAGNOSIS — I10 Essential (primary) hypertension: Secondary | ICD-10-CM | POA: Insufficient documentation

## 2021-06-01 DIAGNOSIS — D708 Other neutropenia: Secondary | ICD-10-CM

## 2021-06-01 DIAGNOSIS — Z87891 Personal history of nicotine dependence: Secondary | ICD-10-CM | POA: Diagnosis not present

## 2021-06-01 DIAGNOSIS — J449 Chronic obstructive pulmonary disease, unspecified: Secondary | ICD-10-CM | POA: Diagnosis not present

## 2021-06-01 DIAGNOSIS — Z789 Other specified health status: Secondary | ICD-10-CM

## 2021-06-01 LAB — CBC WITH DIFFERENTIAL/PLATELET
Abs Immature Granulocytes: 0.01 10*3/uL (ref 0.00–0.07)
Basophils Absolute: 0 10*3/uL (ref 0.0–0.1)
Basophils Relative: 1 %
Eosinophils Absolute: 0.2 10*3/uL (ref 0.0–0.5)
Eosinophils Relative: 6 %
HCT: 42.2 % (ref 36.0–46.0)
Hemoglobin: 13.9 g/dL (ref 12.0–15.0)
Immature Granulocytes: 0 %
Lymphocytes Relative: 41 %
Lymphs Abs: 1.1 10*3/uL (ref 0.7–4.0)
MCH: 28.8 pg (ref 26.0–34.0)
MCHC: 32.9 g/dL (ref 30.0–36.0)
MCV: 87.6 fL (ref 80.0–100.0)
Monocytes Absolute: 0.2 10*3/uL (ref 0.1–1.0)
Monocytes Relative: 8 %
Neutro Abs: 1.2 10*3/uL — ABNORMAL LOW (ref 1.7–7.7)
Neutrophils Relative %: 44 %
Platelets: 200 10*3/uL (ref 150–400)
RBC: 4.82 MIL/uL (ref 3.87–5.11)
RDW: 14.5 % (ref 11.5–15.5)
WBC: 2.7 10*3/uL — ABNORMAL LOW (ref 4.0–10.5)
nRBC: 0 % (ref 0.0–0.2)

## 2021-06-01 LAB — TECHNOLOGIST SMEAR REVIEW
Plt Morphology: NORMAL
RBC MORPHOLOGY: NORMAL
WBC MORPHOLOGY: NORMAL

## 2021-06-01 LAB — HEPATITIS PANEL, ACUTE
HCV Ab: NONREACTIVE
Hep A IgM: NONREACTIVE
Hep B C IgM: NONREACTIVE
Hepatitis B Surface Ag: NONREACTIVE

## 2021-06-01 LAB — HIV ANTIBODY (ROUTINE TESTING W REFLEX): HIV Screen 4th Generation wRfx: NONREACTIVE

## 2021-06-01 LAB — LACTATE DEHYDROGENASE: LDH: 140 U/L (ref 98–192)

## 2021-06-01 LAB — VITAMIN B12: Vitamin B-12: 489 pg/mL (ref 180–914)

## 2021-06-01 MED ORDER — TRELEGY ELLIPTA 100-62.5-25 MCG/ACT IN AEPB: 1.0000 | INHALATION_SPRAY | Freq: Every day | RESPIRATORY_TRACT | 11 refills | Status: DC

## 2021-06-01 NOTE — Progress Notes (Signed)
Pt here to establish care.

## 2021-06-01 NOTE — Patient Instructions (Signed)

## 2021-06-01 NOTE — Progress Notes (Signed)
[facility] [place], [place] [address]  Pulmonary Sleep Medicine   Office Visit Note  Patient Name: Briana Snyder DOB: 07-27-1965 MRN [id number]  Date of Service: 06/01/2021  Complaints/HPI:  Restless Sleep. New diagnosis of COPD. She has SOB and has been having cough. Had a PFT in October showing an FEV1 of 52%. She also snores and also has a very restless sleep. She does not feel sleepy while driving. Not falling asleep at work either. She works day shift. Patient states that she feels tired also. She has been around the same weight. Patient states she is around 176 pounds usually. She quit 2013  ROS  General: (-) fever, (-) chills, (-) night sweats, (-) weakness Skin: (-) rashes, (-) itching,. Eyes: (-) visual changes, (-) redness, (-) itching. Nose and Sinuses: (-) nasal stuffiness or itchiness, (-) postnasal drip, (-) nosebleeds, (-) sinus trouble. Mouth and Throat: (-) sore throat, (-) hoarseness. Neck: (-) swollen glands, (-) enlarged thyroid, (-) neck pain. Respiratory: - cough, (-) bloody sputum, + shortness of breath, - wheezing. Cardiovascular: - ankle swelling, (-) chest pain. Lymphatic: (-) lymph node enlargement. Neurologic: (-) numbness, (-) tingling. Psychiatric: (-) anxiety, (-) depression   Current Medication: Outpatient Encounter Medications as of 06/01/2021  Medication Sig   albuterol (PROVENTIL) (2.5 MG/3ML) 0.083% nebulizer solution Take 3 mLs (2.5 mg total) by nebulization every 6 (six) hours as needed for wheezing or shortness of breath.   albuterol (VENTOLIN HFA) 108 (90 Base) MCG/ACT inhaler Inhale 2 puffs into the lungs every 6 (six) hours as needed for wheezing or shortness of breath.   amLODipine (NORVASC) 2.5 MG tablet TAKE 1 TABLET(2.5 MG) BY MOUTH DAILY   busPIRone (BUSPAR) 10 MG tablet Take 1 tablet (10 mg total) by mouth 2 (two) times daily.   cyanocobalamin (,VITAMIN B-12,) 1000 MCG/ML injection Inject once a week for 3  weeks then once a month   fluticasone (FLONASE) 50 MCG/ACT nasal spray Place 1 spray into both nostrils as needed.   hydrocortisone 1 % ointment Apply 1 application topically 2 (two) times daily.   meloxicam (MOBIC) 7.5 MG tablet Take 1 tablet (7.5 mg total) by mouth daily.   rosuvastatin (CRESTOR) 5 MG tablet Take 1 tab by mouth twice per week before bed   tiotropium (SPIRIVA HANDIHALER) 18 MCG inhalation capsule Place 1 capsule (18 mcg total) into inhaler and inhale daily.   triamcinolone ointment (KENALOG) 0.5 % Apply 1 application topically 2 (two) times daily.   [DISCONTINUED] predniSONE (DELTASONE) 10 MG tablet Take one tab 3 x day for 3 days, then take one tab 2 x a day for 3 days and then take one tab a day for 3 days for URI (Patient not taking: Reported on 06/01/2021)   No facility-administered encounter medications on file as of 06/01/2021.    Surgical History: Past Surgical History:  Procedure Laterality Date   BREAST BIOPSY Left 2012   FIBROADENOMA, 1.0 CM.    BREAST LUMPECTOMY Left 2012   COLONOSCOPY N/A 01/28/2015   Procedure: COLONOSCOPY;  Surgeon: Christene Lye, MD;  Location: [facility] ENDOSCOPY;  Service: Endoscopy;  Laterality: N/A;   endometrial polyps  08/2010   resected   HERNIA REPAIR  2000   inguinal    LAPAROSCOPIC RIGHT HEMI COLECTOMY Right 02/25/2015   Procedure: LAPAROSCOPIC RIGHT HEMI COLECTOMY;  Surgeon: Christene Lye, MD;  Location: [facility] ORS;  Service: General;  Laterality: Right;   THYROIDECTOMY  1987   Right   VARICOSE  VEIN SURGERY      Medical History: Past Medical History:  Diagnosis Date   Anemia    Anxiety    Asthma    B12 deficiency    Benign neoplasm of cecum    Cancer (HCC)    T2,    GERD (gastroesophageal reflux disease)    Headache    Hypertension    Leukopenia     Family History: Family History  Problem Relation Age of Onset   Hypertension Mother    Breast cancer Mother 60   Prostate cancer Father     Social  History: Social History   Socioeconomic History   Marital status: Divorced    Spouse name: Not on file   Number of children: Not on file   Years of education: Not on file   Highest education level: Not on file  Occupational History   Not on file  Tobacco Use   Smoking status: Former    Packs/day: 0.50    Years: 18.00    Pack years: 9.00    Types: Cigarettes    Quit date: 05/25/2011    Years since quitting: 10.0    Passive exposure: Never   Smokeless tobacco: Never  Vaping Use   Vaping Use: Never used  Substance and Sexual Activity   Alcohol use: Yes    Comment: 1-2 cans of  beer almost every day   Drug use: No   Sexual activity: Not on file  Other Topics Concern   Not on file  Social History Narrative   Not on file   Social Determinants of Health   Financial Resource Strain: Not on file  Food Insecurity: Not on file  Transportation Needs: Not on file  Physical Activity: Not on file  Stress: Not on file  Social Connections: Not on file  Intimate Partner Violence: Not on file    Vital Signs: Blood pressure 130/90, pulse 100, temperature 98 F (36.7 C), resp. rate 16, height 5' 6.5" (1.689 m), weight 173 lb (78.5 kg), last menstrual period 02/08/2015, SpO2 99 %.  Examination: General Appearance: The patient is well-developed, well-nourished, and in no distress. Skin: Gross inspection of skin unremarkable. Head: normocephalic, no gross deformities. Eyes: no gross deformities noted. ENT: ears appear grossly normal no exudates. Neck: Supple. No thyromegaly. No LAD. Respiratory: no rhonchi noted. Cardiovascular: Normal S1 and S2 without murmur or rub. Extremities: No cyanosis. pulses are equal. Neurologic: Alert and oriented. No involuntary movements.  LABS: Recent Results (from the past 2160 hour(s))  Pulmonary Function Test     Status: None   Collection Time: 03/11/21  2:00 PM  Result Value Ref Range   FEV1     FVC     FEV1/FVC     TLC     DLCO     Vitamin B12     Status: Abnormal   Collection Time: 04/15/21  9:46 AM  Result Value Ref Range   Vitamin B-12 165 (L) 180 - 914 pg/mL    Comment: (NOTE) This assay is not validated for testing neonatal or myeloproliferative syndrome specimens for Vitamin B12 levels. Performed at [facility], [place] [address], [place], [place] [address]   VITAMIN D 25 Hydroxy (Vit-D Deficiency, Fractures)     Status: Abnormal   Collection Time: 04/15/21  9:48 AM  Result Value Ref Range   Vit D, 25-Hydroxy 23.40 (L) 30 - 100 ng/mL    Comment: (NOTE) Vitamin D deficiency has been defined by the Institute of Medicine  and an Endocrine Society practice guideline as a level of serum 25-OH  vitamin D less than 20 ng/mL (1,2). The Endocrine Society went on to  further define vitamin D insufficiency as a level between 21 and 29  ng/mL (2).  1. IOM (Institute of Medicine). 2010. Dietary reference intakes for  calcium and D. [place]: The [employer]. 2. Holick MF, Binkley Phil Campbell, Bischoff-Ferrari HA, et al. Evaluation,  treatment, and prevention of vitamin D deficiency: an Endocrine  Society clinical practice guideline, JCEM. 2011 Jul; 96(7): 1911-30.  Performed at [facility], [place] [address], [place], [place] [address]   Lipid panel     Status: Abnormal   Collection Time: 04/15/21  9:48 AM  Result Value Ref Range   Cholesterol 183 0 - 200 mg/dL   Triglycerides 263 (H) <150 mg/dL   HDL 61 >40 mg/dL   Total CHOL/HDL Ratio 3.0 RATIO   VLDL 53 (H) 0 - 40 mg/dL   LDL Cholesterol 69 0 - 99 mg/dL    Comment:        Total Cholesterol/HDL:CHD Risk Coronary Heart Disease Risk Table                     Men   Women  1/2 Average Risk   3.4   3.3  Average Risk       5.0   4.4  2 X Average Risk   9.6   7.1  3 X Average Risk  23.4   11.0        Use the calculated Patient Ratio above and the CHD Risk Table to determine the patient's CHD Risk.        ATP III CLASSIFICATION (LDL):   <100     mg/dL   Optimal  100-129  mg/dL   Near or Above                    Optimal  130-159  mg/dL   Borderline  160-189  mg/dL   High  >190     mg/dL   Very High Performed at [facility], [place], [place] [address]   LDL cholesterol, direct     Status: None   Collection Time: 04/15/21  9:48 AM  Result Value Ref Range   Direct LDL 76.3 0 - 99 mg/dL    Comment: Performed at [place] [address], [place], [place] [address]  TSH     Status: None   Collection Time: 04/15/21  9:48 AM  Result Value Ref Range   TSH 1.161 0.350 - 4.500 uIU/mL    Comment: Performed by a 3rd Generation assay with a functional sensitivity of <=0.01 uIU/mL. Performed at [facility], [address] [place], [place] [address]   T4, free     Status: None   Collection Time: 04/15/21  9:48 AM  Result Value Ref Range   Free T4 0.62 0.61 - 1.12 ng/dL    Comment: (NOTE) Biotin ingestion may interfere with free T4 tests. If the results are inconsistent with the TSH level, previous test results, or the clinical presentation, then consider biotin interference. If needed, order repeat testing after stopping biotin. Performed at [facility], [address] [place], [place] [address]   Comprehensive metabolic panel     Status: Abnormal   Collection Time: 04/15/21  9:48 AM  Result Value Ref Range   Sodium 141 135 - 145 mmol/L  Potassium 4.1 3.5 - 5.1 mmol/L   Chloride 106 98 - 111 mmol/L   CO2 28 22 - 32 mmol/L   Glucose, Bld 85 70 - 99 mg/dL    Comment: Glucose reference range applies only to samples taken after fasting for at least 8 hours.   BUN 14 6 - 20 mg/dL   Creatinine, Ser 0.79 0.44 - 1.00 mg/dL   Calcium 8.7 (L) 8.9 - 10.3 mg/dL   Total Protein 7.4 6.5 - 8.1 g/dL   Albumin 4.0 3.5 - 5.0 g/dL   AST 17 15 - 41 U/L   ALT 16 0 - 44 U/L   Alkaline Phosphatase 65 38 - 126 U/L   Total Bilirubin 0.4 0.3 - 1.2 mg/dL   GFR, Estimated >60 >60  mL/min    Comment: (NOTE) Calculated using the CKD-EPI Creatinine Equation (2021)    Anion gap 7 5 - 15    Comment: Performed at [facility], [address] [place], [place] [address]  CBC with Differential/Platelet     Status: Abnormal   Collection Time: 04/15/21  9:48 AM  Result Value Ref Range   WBC 2.4 (L) 4.0 - 10.5 K/uL   RBC 4.42 3.87 - 5.11 MIL/uL   Hemoglobin 13.0 12.0 - 15.0 g/dL   HCT 39.0 36.0 - 46.0 %   MCV 88.2 80.0 - 100.0 fL   MCH 29.4 26.0 - 34.0 pg   MCHC 33.3 30.0 - 36.0 g/dL   RDW 13.9 11.5 - 15.5 %   Platelets 203 150 - 400 K/uL   nRBC 0.0 0.0 - 0.2 %   Neutrophils Relative % 34 %   Neutro Abs 0.8 (L) 1.7 - 7.7 K/uL   Lymphocytes Relative 48 %   Lymphs Abs 1.2 0.7 - 4.0 K/uL   Monocytes Relative 8 %   Monocytes Absolute 0.2 0.1 - 1.0 K/uL   Eosinophils Relative 8 %   Eosinophils Absolute 0.2 0.0 - 0.5 K/uL   Basophils Relative 2 %   Basophils Absolute 0.0 0.0 - 0.1 K/uL   WBC Morphology Abnormal lymphocytes present    RBC Morphology MORPHOLOGY UNREMARKABLE    Smear Review Normal platelet morphology    Immature Granulocytes 0 %   Abs Immature Granulocytes 0.00 0.00 - 0.07 K/uL    Comment: Performed at [facility], [address] [place], [place] [address]  Folate, serum, performed at [facility] lab     Status: None   Collection Time: 04/15/21  9:48 AM  Result Value Ref Range   Folate 8.9 >5.9 ng/mL    Comment: Performed at [facility], [address] [place],  [address]  UA/M w/rflx Culture, Routine     Status: Abnormal   Collection Time: 04/20/21  4:34 PM   Specimen: Urine   Urine  Result Value Ref Range   Specific Gravity, UA 1.022 1.005 - 1.030   pH, UA 6.0 5.0 - 7.5   Color, UA Yellow Yellow   Appearance Ur Cloudy (A) Clear   Leukocytes,UA Negative Negative   Protein,UA Negative Negative/Trace   Glucose, UA Negative Negative   Ketones, UA Negative Negative   RBC, UA Negative Negative   Bilirubin,  UA Negative Negative   Urobilinogen, Ur 0.2 0.2 - 1.0 mg/dL   Nitrite, UA Negative Negative   Microscopic Examination Comment     Comment: Microscopic follows if indicated.   Microscopic Examination See below:     Comment: Microscopic was indicated and was performed.  Urinalysis Reflex Comment     Comment: This specimen will not reflex to a Urine Culture.  Microscopic Examination     Status: None   Collection Time: 04/20/21  4:34 PM   Urine  Result Value Ref Range   WBC, UA 0-5 0 - 5 /hpf   RBC None seen 0 - 2 /hpf   Epithelial Cells (non renal) 0-10 0 - 10 /hpf   Casts None seen None seen /lpf   Bacteria, UA Few None seen/Few  QuantiFERON-TB Gold Plus     Status: None   Collection Time: 04/21/21  3:58 PM  Result Value Ref Range   QuantiFERON Incubation Incubation performed.    QuantiFERON-TB Gold Plus Negative Negative    Comment: (NOTE) No response to M tuberculosis antigens detected. Infection with M tuberculosis is unlikely, but high risk individuals should be considered for additional testing (ATS/IDSA/CDC Clinical Practice Guidelines, 2017). The reference range is an Antigen minus Nil result of <0.35 IU/mL. Chemiluminescence immunoassay methodology Performed At: [facility] [place], [place] [id number] Rush Farmer MD FA:[id number]   Ronney Asters Plus     Status: None   Collection Time: 04/21/21  3:58 PM  Result Value Ref Range   QuantiFERON Criteria Comment     Comment: (NOTE) QuantiFERON-TB Gold Plus is a qualitative indirect test for M tuberculosis infection (including disease) and is intended for use in conjunction with risk assessment, radiography, and other medical and diagnostic evaluations. The QuantiFERON-TB Gold Plus result is determined by subtracting the Nil value from either TB antigen (Ag) value. The Mitogen tube serves as a control for the test.    QuantiFERON TB1 Ag Value 0.00 IU/mL   QuantiFERON TB2 Ag Value 0.00  IU/mL   QuantiFERON Nil Value 0.01 IU/mL   QuantiFERON Mitogen Value >10.00 IU/mL    Comment: (NOTE) Performed At: [facility] [place], [place] [id number] Rush Farmer MD XL:[id number]   Lactate dehydrogenase     Status: None   Collection Time: 06/01/21 10:12 AM  Result Value Ref Range   LDH 140 98 - 192 U/L    Comment: Performed at [facility], [address] [place], [place] [address]  CBC with Differential/Platelet     Status: Abnormal   Collection Time: 06/01/21 10:12 AM  Result Value Ref Range   WBC 2.7 (L) 4.0 - 10.5 K/uL   RBC 4.82 3.87 - 5.11 MIL/uL   Hemoglobin 13.9 12.0 - 15.0 g/dL   HCT 42.2 36.0 - 46.0 %   MCV 87.6 80.0 - 100.0 fL   MCH 28.8 26.0 - 34.0 pg   MCHC 32.9 30.0 - 36.0 g/dL   RDW 14.5 11.5 - 15.5 %   Platelets 200 150 - 400 K/uL   nRBC 0.0 0.0 - 0.2 %   Neutrophils Relative % 44 %   Neutro Abs 1.2 (L) 1.7 - 7.7 K/uL   Lymphocytes Relative 41 %   Lymphs Abs 1.1 0.7 - 4.0 K/uL   Monocytes Relative 8 %   Monocytes Absolute 0.2 0.1 - 1.0 K/uL   Eosinophils Relative 6 %   Eosinophils Absolute 0.2 0.0 - 0.5 K/uL   Basophils Relative 1 %   Basophils Absolute 0.0 0.0 - 0.1 K/uL   Immature Granulocytes 0 %   Abs Immature Granulocytes 0.01 0.00 - 0.07 K/uL    Comment: Performed at [facility], [address] [place], [place] [address]  Technologist smear review     Status: None  Collection Time: 06/01/21 10:15 AM  Result Value Ref Range   WBC MORPHOLOGY Normal RBC, WBC, and platelet    RBC MORPHOLOGY Normal RBC, WBC, and platelet    Tech Review Normal RBC, WBC, and platelet     Comment: Performed at [facility], [address], [place], [place] [address]    Radiology: No results found.  No results found.  No results found.    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[place]) 12/07/2019   Dysuria 12/07/2019   Essential hypertension 10/22/2019   Atopic dermatitis, mild 08/07/2017   Vaginal candidiasis 08/07/2017   Episodic cluster headache, not intractable 08/07/2017   Mild intermittent asthma 08/07/2017   Encounter for general adult medical examination with abnormal findings 08/07/2017   Abnormal Papanicolaou smear of cervix with positive human papilloma virus (HPV) test 08/07/2017   Allergic rhinitis 08/07/2017   Gastroesophageal reflux disease without esophagitis 08/07/2017   GAD (generalized anxiety disorder) 08/07/2017   Vitamin D deficiency 08/07/2017   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 Chronic obstructive pulmonary disease, unspecified COPD type ([place]) Reviewed the pulmonary functions she does have moderate to severe COPD and was started on anticholinergic I think a combination of triple therapy would be more beneficial so therefore I am going to give her a prescription for Trelegy - Fluticasone-Umeclidin-Vilant (TRELEGY ELLIPTA) 100-62.5-25 MCG/ACT AEPB; Inhale 1 puff into the lungs daily.  Dispense: 1 each; Refill: 11  2. Obesity, morbid ([place]) Obesity Counseling: Risk Assessment: An assessment of behavioral risk factors was made today and includes lack of exercise sedentary lifestyle, lack of portion control and poor dietary habits.  Risk Modification Advice: She was counseled on portion control guidelines. Restricting daily caloric intake to 1200. The detrimental long term effects of obesity on her health and ongoing poor compliance was also discussed with the patient.     General Counseling: I have discussed the findings of the evaluation and examination with Briana Snyder.  I have also discussed any further diagnostic evaluation thatmay be needed or ordered today. Briana Snyder verbalizes understanding of the findings of todays visit. We also reviewed her medications today and discussed drug interactions  and side effects including but not limited excessive drowsiness and altered mental states. We also discussed that there is always a risk not just to her but also people around her. she has been encouraged to call the office with any questions or concerns that should arise related to todays visit.  No orders of the defined types were placed in this encounter.    Time spent: 56  I have personally obtained a history, examined the patient, evaluated laboratory and imaging results, formulated the assessment and plan and placed orders.    Allyne Gee, MD [facility] Pulmonary and Critical Care Sleep medicine

## 2021-06-01 NOTE — Progress Notes (Signed)
Hematology/Oncology Consult note [phone] [phone]      Patient Care Team: Lavera Guise, MD as PCP - General (Internal Medicine) Earlie Server, MD as Consulting Physician (Hematology and Oncology) Allyne Gee, MD as Consulting Physician (Pulmonary Disease) Pabon, Marjory Lies, MD as Consulting Physician (General Surgery)  REFERRING PROVIDER: Mylinda Latina, PA*  CHIEF COMPLAINTS/REASON FOR VISIT:  Evaluation of neutropenia  HISTORY OF PRESENTING ILLNESS:   Briana Snyder is a  56 y.o.  female with PMH listed below was seen in consultation at the request of  Mylinda Latina, PA*  for evaluation of neutropenia  Patient drinks 1-2 can of beer every day.  She has vitamin 12 deficiency and has been on parental B12 injections via PCP.  Denies weight loss, fever, chills, fatigue, night sweats.   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      Review of Systems  Constitutional:  Negative for appetite change, chills, fatigue and fever.  HENT:   Negative for hearing loss and voice change.   Eyes:  Negative for eye problems.  Respiratory:  Negative for chest tightness and cough.   Cardiovascular:  Negative for chest pain.  Gastrointestinal:  Negative for abdominal distention, abdominal pain and blood in stool.  Endocrine: Negative for hot flashes.  Genitourinary:  Negative for difficulty urinating and frequency.   Musculoskeletal:  Negative for arthralgias.  Skin:  Negative for itching and rash.  Neurological:  Negative for extremity weakness.  Hematological:  Negative for adenopathy.  Psychiatric/Behavioral:  Negative for confusion.    MEDICAL HISTORY:  Past Medical History:  Diagnosis Date   Anemia    Anxiety    Asthma    B12 deficiency    Benign neoplasm of cecum    Cancer (HCC)    T2,    GERD (gastroesophageal reflux disease)    Headache    Hypertension    Leukopenia     SURGICAL HISTORY: Past Surgical History:  Procedure  Laterality Date   BREAST BIOPSY Left 2012   FIBROADENOMA, 1.0 CM.    BREAST LUMPECTOMY Left 2012   COLONOSCOPY N/A 01/28/2015   Procedure: COLONOSCOPY;  Surgeon: Christene Lye, MD;  Location: [facility] ENDOSCOPY;  Service: Endoscopy;  Laterality: N/A;   endometrial polyps  08/2010   resected   HERNIA REPAIR  2000   inguinal    LAPAROSCOPIC RIGHT HEMI COLECTOMY Right 02/25/2015   Procedure: LAPAROSCOPIC RIGHT HEMI COLECTOMY;  Surgeon: Christene Lye, MD;  Location: [facility] ORS;  Service: General;  Laterality: Right;   THYROIDECTOMY  1987   Right   VARICOSE VEIN SURGERY      SOCIAL HISTORY: Social History   Socioeconomic History   Marital status: Divorced    Spouse name: Not on file   Number of children: Not on file   Years of education: Not on file   Highest education level: Not on file  Occupational History   Not on file  Tobacco Use   Smoking status: Former    Packs/day: 0.50    Years: 18.00    Pack years: 9.00    Types: Cigarettes    Quit date: 05/25/2011    Years since quitting: 10.0    Passive exposure: Never   Smokeless tobacco: Never  Vaping Use   Vaping Use: Never used  Substance and Sexual Activity   Alcohol use: Yes    Comment: 1-2 cans of  beer almost every day   Drug use: No   Sexual activity: Not on file  Other Topics Concern   Not on file  Social History Narrative   Not on file   Social Determinants of Health   Financial Resource Strain: Not on file  Food Insecurity: Not on file  Transportation Needs: Not on file  Physical Activity: Not on file  Stress: Not on file  Social Connections: Not on file  Intimate Partner Violence: Not on file    FAMILY HISTORY: Family History  Problem Relation Age of Onset   Hypertension Mother    Breast cancer Mother 47   Prostate cancer Father     ALLERGIES:  has No Known Allergies.  MEDICATIONS:  Current Outpatient Medications  Medication Sig Dispense Refill   albuterol (PROVENTIL) (2.5 MG/3ML)  0.083% nebulizer solution Take 3 mLs (2.5 mg total) by nebulization every 6 (six) hours as needed for wheezing or shortness of breath. 360 mL 12   albuterol (VENTOLIN HFA) 108 (90 Base) MCG/ACT inhaler Inhale 2 puffs into the lungs every 6 (six) hours as needed for wheezing or shortness of breath. 8 g 0   amLODipine (NORVASC) 2.5 MG tablet TAKE 1 TABLET(2.5 MG) BY MOUTH DAILY 90 tablet 3   busPIRone (BUSPAR) 10 MG tablet Take 1 tablet (10 mg total) by mouth 2 (two) times daily. 180 tablet 3   cyanocobalamin (,VITAMIN B-12,) 1000 MCG/ML injection Inject once a week for 3 weeks then once a month 4 mL 3   fluticasone (FLONASE) 50 MCG/ACT nasal spray Place 1 spray into both nostrils as needed. 16 g 3   hydrocortisone 1 % ointment Apply 1 application topically 2 (two) times daily. 56 g 0   meloxicam (MOBIC) 7.5 MG tablet Take 1 tablet (7.5 mg total) by mouth daily. 90 tablet 1   rosuvastatin (CRESTOR) 5 MG tablet Take 1 tab by mouth twice per week before bed 90 tablet 0   triamcinolone ointment (KENALOG) 0.5 % Apply 1 application topically 2 (two) times daily. 90 g 3   Fluticasone-Umeclidin-Vilant (TRELEGY ELLIPTA) 100-62.5-25 MCG/ACT AEPB Inhale 1 puff into the lungs daily. 1 each 11   No current facility-administered medications for this visit.     PHYSICAL EXAMINATION: ECOG PERFORMANCE STATUS: 0 - Asymptomatic Vitals:   06/01/21 0948  BP: 131/87  Pulse: 82  Resp: 18  Temp: 98 F (36.7 C)   Filed Weights   06/01/21 0948  Weight: 172 lb 11.2 oz (78.3 kg)    Physical Exam Constitutional:      General: She is not in acute distress. HENT:     Head: Normocephalic and atraumatic.  Eyes:     General: No scleral icterus. Cardiovascular:     Rate and Rhythm: Normal rate and regular rhythm.     Heart sounds: Normal heart sounds.  Pulmonary:     Effort: Pulmonary effort is normal. No respiratory distress.     Breath sounds: No wheezing.  Abdominal:     General: Bowel sounds are normal.  There is no distension.     Palpations: Abdomen is soft.  Musculoskeletal:        General: No deformity. Normal range of motion.     Cervical back: Normal range of motion and neck supple.  Skin:    General: Skin is warm and dry.     Findings: No erythema or rash.  Neurological:     Mental Status: She is alert and oriented to person, place, and time. Mental status is at baseline.     Cranial Nerves: No cranial nerve deficit.  Coordination: Coordination normal.  Psychiatric:        Mood and Affect: Mood normal.    LABORATORY DATA:  I have reviewed the data as listed Lab Results  Component Value Date   WBC 2.7 (L) 06/01/2021   HGB 13.9 06/01/2021   HCT 42.2 06/01/2021   MCV 87.6 06/01/2021   PLT 200 06/01/2021   Recent Labs    04/15/21 0948  NA 141  K 4.1  CL 106  CO2 28  GLUCOSE 85  BUN 14  CREATININE 0.79  CALCIUM 8.7*  GFRNONAA >60  PROT 7.4  ALBUMIN 4.0  AST 17  ALT 16  ALKPHOS 65  BILITOT 0.4   Iron/TIBC/Ferritin/ %Sat No results found for: IRON, TIBC, FERRITIN, IRONPCTSAT    RADIOGRAPHIC STUDIES: I have personally reviewed the radiological images as listed and agreed with the findings in the report. No results found.    ASSESSMENT & PLAN:  1. Other neutropenia ([place])   2. Alcohol use    # Neutropenia Labs are reviewed and discussed with patient. Discussed with patient that neutropenia can be caused by chronic alcohol use and B12 deficiency.  Check cbc, smear, HIV, hepatitis panel, B12, flow cytometry, protein electrophoresis, LDH.  # Chronic alcohol use Alcohol cessation was discussed with patient.  Orders Placed This Encounter  Procedures   CBC with Differential/Platelet    Standing Status:   Future    Number of Occurrences:   1    Standing Expiration Date:   06/01/2022   Technologist smear review    Standing Status:   Future    Number of Occurrences:   1    Standing Expiration Date:   06/01/2022   Vitamin B12    Standing Status:   Future     Number of Occurrences:   1    Standing Expiration Date:   06/01/2022   Flow cytometry panel-leukemia/lymphoma work-up    Standing Status:   Future    Number of Occurrences:   1    Standing Expiration Date:   06/01/2022   Lactate dehydrogenase    Standing Status:   Future    Number of Occurrences:   1    Standing Expiration Date:   06/01/2022   Protein electrophoresis, serum    Standing Status:   Future    Number of Occurrences:   1    Standing Expiration Date:   06/01/2022   Hepatitis panel, acute    Standing Status:   Future    Number of Occurrences:   1    Standing Expiration Date:   06/01/2022   HIV Antibody (routine testing w rflx)    Standing Status:   Future    Number of Occurrences:   1    Standing Expiration Date:   06/01/2022    All questions were answered. The patient knows to call the clinic with any problems questions or concerns.  cc McDonough, Si Gaul, PA*    Return of visit: 3 weeks to review results.  Thank you for this kind referral and the opportunity to participate in the care of this patient. A copy of today's note is routed to referring provider   Earlie Server, MD, PhD [facility] Hematology Oncology 06/01/2021

## 2021-06-03 LAB — COMP PANEL: LEUKEMIA/LYMPHOMA

## 2021-06-04 LAB — PROTEIN ELECTROPHORESIS, SERUM
A/G Ratio: 1.2 (ref 0.7–1.7)
Albumin ELP: 4 g/dL (ref 2.9–4.4)
Alpha-1-Globulin: 0.2 g/dL (ref 0.0–0.4)
Alpha-2-Globulin: 0.7 g/dL (ref 0.4–1.0)
Beta Globulin: 1.1 g/dL (ref 0.7–1.3)
Gamma Globulin: 1.4 g/dL (ref 0.4–1.8)
Globulin, Total: 3.3 g/dL (ref 2.2–3.9)
Total Protein ELP: 7.3 g/dL (ref 6.0–8.5)

## 2021-06-26 ENCOUNTER — Inpatient Hospital Stay: Payer: 59 | Attending: Oncology | Admitting: Oncology

## 2021-06-26 ENCOUNTER — Other Ambulatory Visit: Payer: Self-pay

## 2021-06-26 ENCOUNTER — Encounter: Payer: Self-pay | Admitting: Oncology

## 2021-06-26 VITALS — BP 102/70 | HR 73 | Temp 96.6°F | Resp 18 | Wt 173.5 lb

## 2021-06-26 DIAGNOSIS — Z7289 Other problems related to lifestyle: Secondary | ICD-10-CM | POA: Diagnosis not present

## 2021-06-26 DIAGNOSIS — Z8249 Family history of ischemic heart disease and other diseases of the circulatory system: Secondary | ICD-10-CM | POA: Diagnosis not present

## 2021-06-26 DIAGNOSIS — Z9049 Acquired absence of other specified parts of digestive tract: Secondary | ICD-10-CM | POA: Insufficient documentation

## 2021-06-26 DIAGNOSIS — D709 Neutropenia, unspecified: Secondary | ICD-10-CM | POA: Insufficient documentation

## 2021-06-26 DIAGNOSIS — Z86010 Personal history of colonic polyps: Secondary | ICD-10-CM | POA: Diagnosis not present

## 2021-06-26 DIAGNOSIS — Z8042 Family history of malignant neoplasm of prostate: Secondary | ICD-10-CM | POA: Diagnosis not present

## 2021-06-26 DIAGNOSIS — Z789 Other specified health status: Secondary | ICD-10-CM

## 2021-06-26 DIAGNOSIS — Z803 Family history of malignant neoplasm of breast: Secondary | ICD-10-CM | POA: Insufficient documentation

## 2021-06-26 DIAGNOSIS — I10 Essential (primary) hypertension: Secondary | ICD-10-CM | POA: Insufficient documentation

## 2021-06-26 DIAGNOSIS — J45909 Unspecified asthma, uncomplicated: Secondary | ICD-10-CM | POA: Diagnosis not present

## 2021-06-26 DIAGNOSIS — Z79899 Other long term (current) drug therapy: Secondary | ICD-10-CM | POA: Insufficient documentation

## 2021-06-26 DIAGNOSIS — D708 Other neutropenia: Secondary | ICD-10-CM | POA: Diagnosis not present

## 2021-06-26 DIAGNOSIS — Z87891 Personal history of nicotine dependence: Secondary | ICD-10-CM | POA: Insufficient documentation

## 2021-06-26 NOTE — Progress Notes (Signed)
Hematology/Oncology follow up note [phone] [phone]      Patient Care Team: Lavera Guise, MD as PCP - General (Internal Medicine) Earlie Server, MD as Consulting Physician (Hematology and Oncology) Allyne Gee, MD as Consulting Physician (Pulmonary Disease) Jules Husbands, MD as Consulting Physician (General Surgery)  REFERRING PROVIDER: Lavera Guise, MD  CHIEF COMPLAINTS/REASON FOR VISIT:  Neutropenia  HISTORY OF PRESENTING ILLNESS:   Briana Snyder is a  56 y.o.  female with PMH listed below was seen in consultation at the request of  Lavera Guise, MD  for evaluation of neutropenia  Patient drinks 1-2 can of beer every day.  She has vitamin 12 deficiency and has been on parental B12 injections via PCP.  Denies weight loss, fever, chills, fatigue, night sweats.   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    INTERVAL HISTORY Briana Snyder is a 56 y.o. female who has above history reviewed by me today presents for follow up visit for management of neutropenia Patient presents to discuss results. She is currently on vitamin B12 injection monthly per her primary care provider's recommendation. No new complaints.   Review of Systems  Constitutional:  Negative for appetite change, chills, fatigue and fever.  HENT:   Negative for hearing loss and voice change.   Eyes:  Negative for eye problems.  Respiratory:  Negative for chest tightness and cough.   Cardiovascular:  Negative for chest pain.  Gastrointestinal:  Negative for abdominal distention, abdominal pain and blood in stool.  Endocrine: Negative for hot flashes.  Genitourinary:  Negative for difficulty urinating and frequency.   Musculoskeletal:  Negative for arthralgias.  Skin:  Negative for itching and rash.  Neurological:  Negative for extremity weakness.  Hematological:  Negative for adenopathy.  Psychiatric/Behavioral:  Negative for confusion.    MEDICAL HISTORY:  Past Medical  History:  Diagnosis Date   Anemia    Anxiety    Asthma    B12 deficiency    Benign neoplasm of cecum    Cancer (HCC)    T2,    GERD (gastroesophageal reflux disease)    Headache    Hypertension    Leukopenia     SURGICAL HISTORY: Past Surgical History:  Procedure Laterality Date   BREAST BIOPSY Left 2012   FIBROADENOMA, 1.0 CM.    BREAST LUMPECTOMY Left 2012   COLONOSCOPY N/A 01/28/2015   Procedure: COLONOSCOPY;  Surgeon: Christene Lye, MD;  Location: [facility] ENDOSCOPY;  Service: Endoscopy;  Laterality: N/A;   endometrial polyps  08/2010   resected   HERNIA REPAIR  2000   inguinal    LAPAROSCOPIC RIGHT HEMI COLECTOMY Right 02/25/2015   Procedure: LAPAROSCOPIC RIGHT HEMI COLECTOMY;  Surgeon: Christene Lye, MD;  Location: [facility] ORS;  Service: General;  Laterality: Right;   THYROIDECTOMY  1987   Right   VARICOSE VEIN SURGERY      SOCIAL HISTORY: Social History   Socioeconomic History   Marital status: Divorced    Spouse name: Not on file   Number of children: Not on file   Years of education: Not on file   Highest education level: Not on file  Occupational History   Not on file  Tobacco Use   Smoking status: Former    Packs/day: 0.50    Years: 18.00    Pack years: 9.00    Types: Cigarettes    Quit date: 05/25/2011    Years since quitting: 10.0    Passive exposure: Never  Smokeless tobacco: Never  Vaping Use   Vaping Use: Never used  Substance and Sexual Activity   Alcohol use: Yes    Comment: 1-2 cans of  beer almost every day   Drug use: No   Sexual activity: Not on file  Other Topics Concern   Not on file  Social History Narrative   Not on file   Social Determinants of Health   Financial Resource Strain: Not on file  Food Insecurity: Not on file  Transportation Needs: Not on file  Physical Activity: Not on file  Stress: Not on file  Social Connections: Not on file  Intimate Partner Violence: Not on file    FAMILY HISTORY: Family  History  Problem Relation Age of Onset   Hypertension Mother    Breast cancer Mother 76   Prostate cancer Father     ALLERGIES:  has No Known Allergies.  MEDICATIONS:  Current Outpatient Medications  Medication Sig Dispense Refill   albuterol (PROVENTIL) (2.5 MG/3ML) 0.083% nebulizer solution Take 3 mLs (2.5 mg total) by nebulization every 6 (six) hours as needed for wheezing or shortness of breath. 360 mL 12   albuterol (VENTOLIN HFA) 108 (90 Base) MCG/ACT inhaler Inhale 2 puffs into the lungs every 6 (six) hours as needed for wheezing or shortness of breath. 8 g 0   amLODipine (NORVASC) 2.5 MG tablet TAKE 1 TABLET(2.5 MG) BY MOUTH DAILY 90 tablet 3   busPIRone (BUSPAR) 10 MG tablet Take 1 tablet (10 mg total) by mouth 2 (two) times daily. 180 tablet 3   cyanocobalamin (,VITAMIN B-12,) 1000 MCG/ML injection Inject once a week for 3 weeks then once a month 4 mL 3   fluticasone (FLONASE) 50 MCG/ACT nasal spray Place 1 spray into both nostrils as needed. 16 g 3   Fluticasone-Umeclidin-Vilant (TRELEGY ELLIPTA) 100-62.5-25 MCG/ACT AEPB Inhale 1 puff into the lungs daily. 1 each 11   hydrocortisone 1 % ointment Apply 1 application topically 2 (two) times daily. 56 g 0   meloxicam (MOBIC) 7.5 MG tablet Take 1 tablet (7.5 mg total) by mouth daily. 90 tablet 1   triamcinolone ointment (KENALOG) 0.5 % Apply 1 application topically 2 (two) times daily. 90 g 3   No current facility-administered medications for this visit.     PHYSICAL EXAMINATION: ECOG PERFORMANCE STATUS: 0 - Asymptomatic Vitals:   06/26/21 1013  BP: 102/70  Pulse: 73  Resp: 18  Temp: (!) 96.6 F (35.9 C)   Filed Weights   06/26/21 1013  Weight: 173 lb 8 oz (78.7 kg)    Physical Exam Constitutional:      General: She is not in acute distress. HENT:     Head: Normocephalic and atraumatic.  Eyes:     General: No scleral icterus. Cardiovascular:     Rate and Rhythm: Normal rate.  Pulmonary:     Effort: Pulmonary  effort is normal.  Abdominal:     General: There is no distension.  Musculoskeletal:        General: No deformity. Normal range of motion.     Cervical back: Normal range of motion and neck supple.  Neurological:     Mental Status: She is alert and oriented to person, place, and time. Mental status is at baseline.     Cranial Nerves: No cranial nerve deficit.     Coordination: Coordination normal.  Psychiatric:        Mood and Affect: Mood normal.    LABORATORY DATA:  I have reviewed  the data as listed Lab Results  Component Value Date   WBC 2.7 (L) 06/01/2021   HGB 13.9 06/01/2021   HCT 42.2 06/01/2021   MCV 87.6 06/01/2021   PLT 200 06/01/2021   Recent Labs    04/15/21 0948  NA 141  K 4.1  CL 106  CO2 28  GLUCOSE 85  BUN 14  CREATININE 0.79  CALCIUM 8.7*  GFRNONAA >60  PROT 7.4  ALBUMIN 4.0  AST 17  ALT 16  ALKPHOS 65  BILITOT 0.4    Iron/TIBC/Ferritin/ %Sat No results found for: IRON, TIBC, FERRITIN, IRONPCTSAT    RADIOGRAPHIC STUDIES: I have personally reviewed the radiological images as listed and agreed with the findings in the report. No results found.    ASSESSMENT & PLAN:  1. Other neutropenia ([place])   2. Alcohol use    # Neutropenia Labs reviewed and discussed with patient. Leukopenia total white blood cell count 2.7 with ANC of 1.2.  Levels are improved compared to the level on 04/15/2021. Peripheral blood flow cytometry was negative, SPEP negative, LDH normal.  Negative HIV, hepatitis panel. Discussed with patient that I think neutropenia could be secondary to chronic alcohol use.  And history of vitamin B12 deficiency may contribute to that 2.  Her B12 level has significantly improved and ANC has improved as well. Encourage patient's alcohol cessation efforts. She has decreased alcohol consumption lately.  Recommend continue to monitor.  Repeat labs in 6 months. Orders Placed This Encounter  Procedures   CBC with Differential/Platelet     Standing Status:   Future    Standing Expiration Date:   06/26/2022   Comprehensive metabolic panel    Standing Status:   Future    Standing Expiration Date:   06/26/2022   Vitamin B12    Standing Status:   Future    Standing Expiration Date:   06/26/2022   Folate    Standing Status:   Future    Standing Expiration Date:   06/26/2022    All questions were answered. The patient knows to call the clinic with any problems questions or concerns.  cc Lavera Guise, MD    Return of visit: 6 months   Earlie Server, MD, PhD [facility] Hematology Oncology 06/26/2021

## 2021-06-26 NOTE — Progress Notes (Signed)
Pt here for follow up. No new concerns voiced.   

## 2021-06-29 ENCOUNTER — Ambulatory Visit: Payer: 59

## 2021-07-03 ENCOUNTER — Other Ambulatory Visit: Payer: Self-pay

## 2021-07-03 DIAGNOSIS — L209 Atopic dermatitis, unspecified: Secondary | ICD-10-CM

## 2021-07-03 MED ORDER — TRIAMCINOLONE ACETONIDE 0.5 % EX OINT: 1.0000 "application " | TOPICAL_OINTMENT | Freq: Two times a day (BID) | CUTANEOUS | 3 refills | Status: DC

## 2021-07-06 ENCOUNTER — Ambulatory Visit (INDEPENDENT_AMBULATORY_CARE_PROVIDER_SITE_OTHER): Payer: 59

## 2021-07-06 ENCOUNTER — Other Ambulatory Visit: Payer: Self-pay

## 2021-07-06 DIAGNOSIS — E538 Deficiency of other specified B group vitamins: Secondary | ICD-10-CM

## 2021-07-06 MED ORDER — CYANOCOBALAMIN 1000 MCG/ML IJ SOLN
1000.0000 ug | Freq: Once | INTRAMUSCULAR | Status: AC
  Administered 2021-07-06: 1000 ug via INTRAMUSCULAR

## 2021-08-05 ENCOUNTER — Other Ambulatory Visit: Payer: Self-pay

## 2021-08-05 ENCOUNTER — Ambulatory Visit (INDEPENDENT_AMBULATORY_CARE_PROVIDER_SITE_OTHER): Payer: 59

## 2021-08-05 DIAGNOSIS — E538 Deficiency of other specified B group vitamins: Secondary | ICD-10-CM | POA: Diagnosis not present

## 2021-08-05 MED ORDER — CYANOCOBALAMIN 1000 MCG/ML IJ SOLN
1000.0000 ug | Freq: Once | INTRAMUSCULAR | Status: AC
  Administered 2021-08-05: 1000 ug via INTRAMUSCULAR

## 2021-08-17 ENCOUNTER — Encounter: Payer: Self-pay | Admitting: Physician Assistant

## 2021-08-17 ENCOUNTER — Other Ambulatory Visit: Payer: Self-pay

## 2021-08-17 ENCOUNTER — Ambulatory Visit (INDEPENDENT_AMBULATORY_CARE_PROVIDER_SITE_OTHER): Payer: 59 | Admitting: Physician Assistant

## 2021-08-17 DIAGNOSIS — M25552 Pain in left hip: Secondary | ICD-10-CM | POA: Diagnosis not present

## 2021-08-17 DIAGNOSIS — G8929 Other chronic pain: Secondary | ICD-10-CM

## 2021-08-17 DIAGNOSIS — J449 Chronic obstructive pulmonary disease, unspecified: Secondary | ICD-10-CM

## 2021-08-17 DIAGNOSIS — E538 Deficiency of other specified B group vitamins: Secondary | ICD-10-CM

## 2021-08-17 DIAGNOSIS — I10 Essential (primary) hypertension: Secondary | ICD-10-CM

## 2021-08-17 MED ORDER — CYANOCOBALAMIN 1000 MCG/ML IJ SOLN
1000.0000 ug | Freq: Once | INTRAMUSCULAR | Status: AC
  Administered 2021-08-17: 1000 ug via INTRAMUSCULAR

## 2021-08-17 NOTE — Progress Notes (Signed)
[facility] ?[address] ?[place], [place] [address] ? ?Internal MEDICINE  ?Office Visit Note ? ?Patient Name: Briana Snyder ? [id number]  ?[id number] ? ?Date of Service: 08/18/2021 ? ?Chief Complaint  ?Patient presents with  ? Follow-up  ? Gastroesophageal Reflux  ? Hypertension  ? Hip Pain  ?  Still having left hip pain - wants xray  ? Medication Refill  ? ? ?HPI ?Pt is here for routine follow up ?-Seeing pulmonology now to follow COPD, quit smoking in 2013, doing well on new inhaler ?-she is also followed by hematology for neutropenia and states they are monitoring labs and has another follow up scheduled.  ?-receiving B12 shots in office and feeling better since replacement.  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 ?Current Medication: ?Outpatient Encounter Medications as of 08/17/2021  ?Medication Sig  ? albuterol (PROVENTIL) (2.5 MG/3ML) 0.083% nebulizer solution Take 3 mLs (2.5 mg total) by nebulization every 6 (six) hours as needed for wheezing or shortness of breath.  ? albuterol (VENTOLIN HFA) 108 (90 Base) MCG/ACT inhaler Inhale 2 puffs into the lungs every 6 (six) hours as needed for wheezing or shortness of breath.  ? amLODipine (NORVASC) 2.5 MG tablet TAKE 1 TABLET(2.5 MG) BY MOUTH DAILY  ? busPIRone (BUSPAR) 10 MG tablet Take 1 tablet (10 mg total) by mouth 2 (two) times daily.  ? cyanocobalamin (,VITAMIN B-12,) 1000 MCG/ML injection Inject once a week for 3 weeks then once a month  ? fluticasone (FLONASE) 50 MCG/ACT nasal spray Place 1 spray into both nostrils as needed.  ? Fluticasone-Umeclidin-Vilant (TRELEGY ELLIPTA)  100-62.5-25 MCG/ACT AEPB Inhale 1 puff into the lungs daily.  ? hydrocortisone 1 % ointment Apply 1 application topically 2 (two) times daily.  ? meloxicam (MOBIC) 7.5 MG tablet Take 1 tablet (7.5 mg total) by mouth daily.  ? triamcinolone ointment (KENALOG) 0.5 % Apply 1 application topically 2 (two) times daily.  ? [EXPIRED] cyanocobalamin ((VITAMIN B-12)) injection 1,000 mcg   ? ?No facility-administered encounter medications on file as of 08/17/2021.  ? ? ?Surgical History: ?Past Surgical History:  ?Procedure Laterality Date  ? BREAST BIOPSY Left 2012  ? FIBROADENOMA, 1.0 CM.   ? BREAST LUMPECTOMY Left 2012  ? COLONOSCOPY N/A 01/28/2015  ? Procedure: COLONOSCOPY;  Surgeon: Christene Lye, MD;  Location: [facility] ENDOSCOPY;  Service: Endoscopy;  Laterality: N/A;  ? endometrial polyps  08/2010  ? resected  ? HERNIA REPAIR  2000  ? inguinal   ? LAPAROSCOPIC RIGHT HEMI COLECTOMY Right 02/25/2015  ? Procedure: LAPAROSCOPIC RIGHT HEMI COLECTOMY;  Surgeon: Christene Lye, MD;  Location: [facility] ORS;  Service: General;  Laterality: Right;  ? THYROIDECTOMY  1987  ? Right  ? VARICOSE VEIN SURGERY    ? ? ?Medical History: ?Past Medical History:  ?Diagnosis Date  ? Anemia   ? Anxiety   ? Asthma   ? B12 deficiency   ? Benign neoplasm of cecum   ? 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 GERD (gastroesophageal reflux disease)   ? Headache   ? Hypertension   ? Leukopenia   ? ? ?  Family History: ?Family History  ?Problem Relation Age of Onset  ? Hypertension Mother   ? Breast cancer Mother 72  ? Prostate cancer Father   ? ? ?Social History  ? ?Socioeconomic History  ? Marital status: Divorced  ?  Spouse name: Not on file  ? Number of children: Not on file  ? Years of education: Not on file  ? Highest education level: Not on file  ?Occupational History  ? Not on file  ?Tobacco Use  ? Smoking status: Former  ?  Packs/day: 0.50  ?  Years: 18.00  ?  Pack years: 9.00  ?  Types: Cigarettes  ?  Quit date: 05/25/2011  ?  Years since quitting: 10.2  ?   Passive exposure: Never  ? Smokeless tobacco: Never  ?Vaping Use  ? Vaping Use: Never used  ?Substance and Sexual Activity  ? Alcohol use: Yes  ?  Comment: 1-2 cans of  beer almost every day  ? Drug use: No  ? Sexual activity: Not on file  ?Other Topics Concern  ? Not on file  ?Social History Narrative  ? Not on file  ? ?Social Determinants of Health  ? ?Financial Resource Strain: Not on file  ?Food Insecurity: Not on file  ?Transportation Needs: Not on file  ?Physical Activity: Not on file  ?Stress: Not on file  ?Social Connections: Not on file  ?Intimate Partner Violence: Not on file  ? ? ? ? ?Review of Systems  ?Constitutional:  Negative for chills, fatigue and unexpected weight change.  ?HENT:  Negative for congestion, postnasal drip, rhinorrhea, sneezing and sore throat.   ?Eyes:  Negative for redness.  ?Respiratory:  Negative for cough, chest tightness and shortness of breath.   ?Cardiovascular:  Negative for chest pain and palpitations.  ?Gastrointestinal:  Negative for abdominal pain, constipation, diarrhea, nausea and vomiting.  ?Genitourinary:  Negative for dysuria and frequency.  ?Musculoskeletal:  Positive for arthralgias and back pain. Negative for joint swelling and neck pain.  ?     Left hip pain  ?Skin:  Negative for rash.  ?Neurological: Negative.  Negative for tremors and numbness.  ?Hematological:  Negative for adenopathy. Does not bruise/bleed easily.  ?Psychiatric/Behavioral:  Negative for behavioral problems (Depression), sleep disturbance and suicidal ideas. The patient is not nervous/anxious.   ? ?Vital Signs: ?BP 128/78 Comment: 150/101  Pulse 71   Temp 98.4 ?F (36.9 ?C)   Resp 16   Ht 5' 6.5" (1.689 m)   Wt 174 lb (78.9 kg)   LMP 02/08/2015   SpO2 99%   BMI 27.66 kg/m?  ? ? ?Physical Exam ?Vitals and nursing note reviewed.  ?Constitutional:   ?   General: She is not in acute distress. ?   Appearance: She is well-developed. She is not diaphoretic.  ?HENT:  ?   Head: Normocephalic and  atraumatic.  ?   Mouth/Throat:  ?   Pharynx: No oropharyngeal exudate.  ?Eyes:  ?   Pupils: Pupils are equal, round, and reactive to light.  ?Neck:  ?   Thyroid: No thyromegaly.  ?   Vascular: No JVD.  ?   Trachea: No tracheal deviation.  ?Cardiovascular:  ?   Rate and Rhythm: Normal rate and regular rhythm.  ?   Heart sounds: Normal heart sounds. No murmur heard. ?  No friction rub. No gallop.  ?Pulmonary:  ?   Effort: Pulmonary effort is normal. No respiratory distress.  ?   Breath sounds: No wheezing or rales.  ?  Chest:  ?   Chest wall: No tenderness.  ?Abdominal:  ?   General: Bowel sounds are normal.  ?   Palpations: Abdomen is soft.  ?Musculoskeletal:     ?   General: No tenderness. Normal range of motion.  ?   Cervical back: Normal range of motion and neck supple.  ?   Right lower leg: No edema.  ?   Left lower leg: No edema.  ?   Comments: Full ROM, neg SLR, some pain with internal rotation  ?Lymphadenopathy:  ?   Cervical: No cervical adenopathy.  ?Skin: ?   General: Skin is warm and dry.  ?Neurological:  ?   Mental Status: She is alert and oriented to person, place, and time.  ?   Cranial Nerves: No cranial nerve deficit.  ?   Motor: No weakness.  ?Psychiatric:     ?   Behavior: Behavior normal.     ?   Thought Content: Thought content normal.     ?   Judgment: Judgment normal.  ? ? ? ? ? ?Assessment/Plan: ?1. Essential hypertension ?Stable, continue current medication ? ?2. Chronic left hip pain ?Will order xray for further evaluation and consider ortho/PT referral pending results. May continue meloxicam ?- DG Hip Unilat W OR W/O Pelvis 2-3 Views Left; Future ? ?3. Chronic obstructive pulmonary disease, unspecified COPD type ([place]) ?Continue inhaler as prescribed and as indicated, followed by pulmonology ? ?4. B12 deficiency ?- cyanocobalamin ((VITAMIN B-12)) injection 1,000 mcg ? ? ?General Counseling: Briana Snyder verbalizes understanding of the findings of todays visit and agrees with plan of treatment. I have  discussed any further diagnostic evaluation that may be needed or ordered today. We also reviewed her medications today. she has been encouraged to call the office with any questions or concerns that should arise rel

## 2021-09-08 ENCOUNTER — Ambulatory Visit
Admission: RE | Admit: 2021-09-08 | Discharge: 2021-09-08 | Disposition: A | Discharge status: Home / Self Care | Payer: 59 | Source: Ambulatory Visit | Attending: Physician Assistant | Admitting: Physician Assistant

## 2021-09-08 ENCOUNTER — Other Ambulatory Visit: Payer: Self-pay

## 2021-09-08 DIAGNOSIS — M25552 Pain in left hip: Secondary | ICD-10-CM | POA: Diagnosis present

## 2021-09-08 DIAGNOSIS — G8929 Other chronic pain: Secondary | ICD-10-CM | POA: Diagnosis present

## 2021-09-14 ENCOUNTER — Other Ambulatory Visit: Payer: Self-pay | Admitting: Physician Assistant

## 2021-09-14 DIAGNOSIS — G8929 Other chronic pain: Secondary | ICD-10-CM

## 2021-09-14 DIAGNOSIS — M8560 Other cyst of bone, unspecified site: Secondary | ICD-10-CM

## 2021-09-15 ENCOUNTER — Telehealth: Payer: Self-pay

## 2021-09-15 NOTE — Telephone Encounter (Signed)
Referral sent via [facility] to [facility] orthopedics-Toni ?

## 2021-09-16 ENCOUNTER — Telehealth: Payer: Self-pay

## 2021-09-17 ENCOUNTER — Telehealth: Payer: Self-pay

## 2021-09-17 NOTE — Telephone Encounter (Signed)
LMOM informing pt that her xray showed degenerative changes, loss of joint space, and cysts in left hip. Lauren will send a referral to ortho for her.  Advised pt to call clinic if any questions ?

## 2021-09-21 NOTE — Telephone Encounter (Signed)
Scheduled w/ [facility] 09/21/21-Toni ?

## 2021-10-05 ENCOUNTER — Ambulatory Visit: Payer: 59 | Admitting: Internal Medicine

## 2021-10-12 ENCOUNTER — Ambulatory Visit (INDEPENDENT_AMBULATORY_CARE_PROVIDER_SITE_OTHER): Payer: 59 | Admitting: Physician Assistant

## 2021-10-12 ENCOUNTER — Encounter: Payer: Self-pay | Admitting: Physician Assistant

## 2021-10-12 DIAGNOSIS — E538 Deficiency of other specified B group vitamins: Secondary | ICD-10-CM | POA: Diagnosis not present

## 2021-10-12 DIAGNOSIS — Z01419 Encounter for gynecological examination (general) (routine) without abnormal findings: Secondary | ICD-10-CM

## 2021-10-12 DIAGNOSIS — Z124 Encounter for screening for malignant neoplasm of cervix: Secondary | ICD-10-CM

## 2021-10-12 DIAGNOSIS — Z113 Encounter for screening for infections with a predominantly sexual mode of transmission: Secondary | ICD-10-CM

## 2021-10-12 MED ORDER — CYANOCOBALAMIN 1000 MCG/ML IJ SOLN
1000.0000 ug | Freq: Once | INTRAMUSCULAR | Status: AC
  Administered 2021-10-12: 1000 ug via INTRAMUSCULAR

## 2021-10-12 MED ORDER — CYANOCOBALAMIN 1000 MCG/ML IJ SOLN: 1000.0000 ug | Freq: Once | INTRAMUSCULAR | Status: DC

## 2021-10-12 NOTE — Progress Notes (Signed)
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 was NILM, but positive for HPV therefore will be repeated today -Needs phone number for mammogram scheduling and will be given this today -She is otherwise doing well and was seen by ortho for her hip pain and reports she is completing steroids -Bp is on the lower side and has not had much water today and will make sure she is hydrating and will monitor BP as she may not need to continue norvasc if it continues to stay low -Pt will call when she is ready for GI referral for colonoscopy when she has a better idea of when someone will be able to drive her  Current Medication: Outpatient Encounter Medications as of 10/12/2021  Medication Sig   albuterol (PROVENTIL) (2.5 MG/3ML) 0.083% nebulizer solution Take 3 mLs (2.5 mg total) by nebulization every 6 (six) hours as needed for wheezing or shortness of breath.   albuterol (VENTOLIN HFA) 108 (90 Base) MCG/ACT inhaler Inhale 2 puffs into the lungs every 6 (six) hours as needed for wheezing or shortness of breath.   amLODipine (NORVASC) 2.5 MG tablet TAKE 1 TABLET(2.5 MG) BY MOUTH DAILY   busPIRone (BUSPAR) 10 MG tablet Take 1 tablet (10 mg total) by mouth 2 (two) times daily.   cyanocobalamin (,VITAMIN B-12,) 1000 MCG/ML injection Inject once a week for 3 weeks then once a month   fluticasone (FLONASE) 50 MCG/ACT nasal spray Place 1 spray into both nostrils as needed.   Fluticasone-Umeclidin-Vilant (TRELEGY ELLIPTA) 100-62.5-25 MCG/ACT AEPB Inhale 1 puff into the lungs daily.   hydrocortisone 1 % ointment Apply 1 application topically 2 (two) times daily.   meloxicam  (MOBIC) 7.5 MG tablet Take 1 tablet (7.5 mg total) by mouth daily.   triamcinolone ointment (KENALOG) 0.5 % Apply 1 application topically 2 (two) times daily.   [EXPIRED] cyanocobalamin ((VITAMIN B-12)) injection 1,000 mcg    [DISCONTINUED] cyanocobalamin ((VITAMIN B-12)) injection 1,000 mcg    No facility-administered encounter medications on file as of 10/12/2021.    Surgical History: Past Surgical History:  Procedure Laterality Date   BREAST BIOPSY Left 2012   FIBROADENOMA, 1.0 CM.    BREAST LUMPECTOMY Left 2012   COLONOSCOPY N/A 01/28/2015   Procedure: COLONOSCOPY;  Surgeon: Christene Lye, MD;  Location: [facility] ENDOSCOPY;  Service: Endoscopy;  Laterality: N/A;   endometrial polyps  08/2010   resected   HERNIA REPAIR  2000   inguinal    LAPAROSCOPIC RIGHT HEMI COLECTOMY Right 02/25/2015   Procedure: LAPAROSCOPIC RIGHT HEMI COLECTOMY;  Surgeon: Christene Lye, MD;  Location: [facility] ORS;  Service: General;  Laterality: Right;   THYROIDECTOMY  1987   Right   VARICOSE VEIN SURGERY      Medical History: Past Medical History:  Diagnosis Date   Anemia    Anxiety    Asthma    B12 deficiency    Benign neoplasm of cecum    Cancer ([place])    T2,    GERD (gastroesophageal reflux disease)    Headache    Hypertension    Leukopenia     Family History: Family History  Problem Relation Age of Onset   Hypertension Mother    Breast cancer Mother 42   Prostate cancer Father     Social History   Socioeconomic History   Marital status: Divorced    Spouse name: Not on file   Number of children: Not on file   Years of education: Not on file   Highest education level: Not on file  Occupational History   Not on file  Tobacco Use   Smoking status: Former    Packs/day: 0.50    Years: 18.00    Pack years: 9.00    Types: Cigarettes    Quit date: 05/25/2011    Years since quitting: 10.3    Passive exposure: Never   Smokeless tobacco: Never  Vaping Use   Vaping Use: Never  used  Substance and Sexual Activity   Alcohol use: Yes    Comment: 1-2 cans of  beer almost every day   Drug use: No   Sexual activity: Not on file  Other Topics Concern   Not on file  Social History Narrative   Not on file   Social Determinants of Health   Financial Resource Strain: Not on file  Food Insecurity: Not on file  Transportation Needs: Not on file  Physical Activity: Not on file  Stress: Not on file  Social Connections: Not on file  Intimate Partner Violence: Not on file     Review of Systems  Constitutional:  Negative for chills, fatigue and unexpected weight change.  HENT:  Negative for congestion, postnasal drip, rhinorrhea, sneezing and sore throat.   Eyes:  Negative for redness.  Respiratory:  Negative for cough, chest tightness and shortness of breath.   Cardiovascular:  Negative for chest pain and palpitations.  Gastrointestinal:  Negative for abdominal pain, constipation, diarrhea, nausea and vomiting.  Genitourinary:  Negative for dysuria and frequency.  Musculoskeletal:  Positive for arthralgias. Negative for back pain, joint swelling and neck pain.  Skin:  Negative for rash.  Neurological: Negative.  Negative for tremors and numbness.  Hematological:  Negative for adenopathy. Does not bruise/bleed easily.  Psychiatric/Behavioral:  Negative for behavioral problems (Depression), sleep disturbance and suicidal ideas. The patient is not nervous/anxious.     Vital signs: BP 109/79   Pulse 99   Temp 98.4 F (36.9 C)   Resp 16   Ht 5' 6.5" (1.689 m)   Wt 182 lb 9.6 oz (82.8 kg)   LMP 02/08/2015   SpO2 99%   BMI 29.03 kg/m    Physical Exam Vitals and nursing note reviewed.  Constitutional:      General: She is not in acute distress.    Appearance: She is well-developed. She is not diaphoretic.  HENT:     Head: Normocephalic and atraumatic.  Eyes:     Pupils: Pupils are equal, round, and reactive to light.  Neck:     Thyroid: No thyromegaly.      Vascular: No JVD.     Trachea: No tracheal deviation.  Cardiovascular:     Rate and Rhythm: Normal rate and regular rhythm.     Heart sounds: Normal heart sounds. No murmur heard.   No friction rub. No gallop.  Pulmonary:     Effort: Pulmonary effort is normal. No respiratory distress.     Breath sounds: No wheezing or rales.  Chest:     Chest wall: No tenderness.  Abdominal:     General: Bowel sounds are normal.     Palpations: Abdomen  is soft.  Genitourinary:    Vagina: Vaginal discharge present.     Cervix: Normal.     Comments: Pap performed Musculoskeletal:        General: Normal range of motion.     Cervical back: Normal range of motion and neck supple.  Lymphadenopathy:     Cervical: No cervical adenopathy.  Skin:    General: Skin is warm and dry.  Neurological:     Mental Status: She is alert and oriented to person, place, and time.     Cranial Nerves: No cranial nerve deficit.  Psychiatric:        Behavior: Behavior normal.        Thought Content: Thought content normal.        Judgment: Judgment normal.      Assessment/Plan: 1. Visit for gynecologic examination Pap performed  2. Routine cervical smear - IGP, Aptima HPV  3. Screen for STD (sexually transmitted disease) - NuSwab Vaginitis Plus (VG+)  4. B12 deficiency Will plan to continue B12 up until a month before blood draw by heme/onc to recheck levels without false elevation from recent injection again - cyanocobalamin ((VITAMIN B-12)) injection 1,000 mcg   General Counseling: Adajah verbalizes understanding of the findings of todays visit and agrees with plan of treatment. I have discussed any further diagnostic evaluation that may be needed or ordered today. We also reviewed her medications today. she has been encouraged to call the office with any questions or concerns that should arise related to todays visit.    Counseling:    Orders Placed This Encounter  Procedures   NuSwab  Vaginitis Plus (VG+)    Meds ordered this encounter  Medications   cyanocobalamin ((VITAMIN B-12)) injection 1,000 mcg   DISCONTD: cyanocobalamin ((VITAMIN B-12)) injection 1,000 mcg    Time spent:30 Minutes

## 2021-10-15 LAB — NUSWAB VAGINITIS PLUS (VG+)
Candida albicans, NAA: NEGATIVE
Candida glabrata, NAA: NEGATIVE
Chlamydia trachomatis, NAA: NEGATIVE
Neisseria gonorrhoeae, NAA: NEGATIVE
Trich vag by NAA: NEGATIVE

## 2021-10-20 ENCOUNTER — Telehealth: Payer: Self-pay

## 2021-10-20 ENCOUNTER — Other Ambulatory Visit: Payer: Self-pay | Admitting: Physician Assistant

## 2021-10-20 DIAGNOSIS — R87612 Low grade squamous intraepithelial lesion on cytologic smear of cervix (LGSIL): Secondary | ICD-10-CM

## 2021-10-20 DIAGNOSIS — R87618 Other abnormal cytological findings on specimens from cervix uteri: Secondary | ICD-10-CM

## 2021-10-20 LAB — IGP, APTIMA HPV: HPV Aptima: POSITIVE — AB

## 2021-10-20 NOTE — Telephone Encounter (Signed)
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 Lauren will be sending referral to OBGYN for further evaluation.

## 2021-10-20 NOTE — Telephone Encounter (Signed)
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 I will be sending referral to OBGYN for further evaluation.

## 2021-10-22 ENCOUNTER — Ambulatory Visit: Payer: 59 | Admitting: Physician Assistant

## 2021-11-02 ENCOUNTER — Ambulatory Visit: Payer: 59 | Admitting: Internal Medicine

## 2021-11-02 ENCOUNTER — Ambulatory Visit (INDEPENDENT_AMBULATORY_CARE_PROVIDER_SITE_OTHER): Payer: 59

## 2021-11-02 ENCOUNTER — Encounter: Payer: Self-pay | Admitting: Internal Medicine

## 2021-11-02 VITALS — BP 116/81 | HR 88 | Temp 98.4°F | Resp 16 | Ht 66.5 in | Wt 182.0 lb

## 2021-11-02 DIAGNOSIS — E538 Deficiency of other specified B group vitamins: Secondary | ICD-10-CM | POA: Diagnosis not present

## 2021-11-02 DIAGNOSIS — R0602 Shortness of breath: Secondary | ICD-10-CM

## 2021-11-02 DIAGNOSIS — J449 Chronic obstructive pulmonary disease, unspecified: Secondary | ICD-10-CM | POA: Diagnosis not present

## 2021-11-02 MED ORDER — CYANOCOBALAMIN 1000 MCG/ML IJ SOLN
1000.0000 ug | Freq: Once | INTRAMUSCULAR | Status: AC
  Administered 2021-11-02: 1000 ug via INTRAMUSCULAR

## 2021-11-02 NOTE — Patient Instructions (Signed)
Chronic Obstructive Pulmonary Disease  Chronic obstructive pulmonary disease (COPD) is a long-term (chronic) lung problem. When you have COPD, it is hard for air to get in and out of your lungs. Usually the condition gets worse over time, and your lungs will never return to normal. There are things you can do to keep yourself as healthy as possible. What are the causes? Smoking. This is the most common cause. Certain genes passed from parent to child (inherited). What increases the risk? Being exposed to secondhand smoke from cigarettes, pipes, or cigars. Being exposed to chemicals and other irritants, such as fumes and dust in the work environment. Having chronic lung conditions or infections. What are the signs or symptoms? Shortness of breath, especially during physical activity. A long-term cough with a large amount of thick mucus. Sometimes, the cough may not have any mucus (dry cough). Wheezing. Breathing quickly. Skin that looks gray or blue, especially in the fingers, toes, or lips. Feeling tired (fatigue). Weight loss. Chest tightness. Having infections often. Episodes when breathing symptoms become much worse (exacerbations). At the later stages of this disease, you may have swelling in the ankles, feet, or legs. How is this treated? Taking medicines. Quitting smoking, if you smoke. Rehabilitation. This includes steps to make your body work better. It may involve a team of specialists. Doing exercises. Making changes to your diet. Using oxygen. Lung surgery. Lung transplant. Comfort measures (palliative care). Follow these instructions at home: Medicines Take over-the-counter and prescription medicines only as told by your doctor. Talk to your doctor before taking any cough or allergy medicines. You may need to avoid medicines that cause your lungs to be dry. Lifestyle If you smoke, stop smoking. Smoking makes the problem worse. Do not smoke or use any products that  contain nicotine or tobacco. If you need help quitting, ask your doctor. Avoid being around things that make your breathing worse. This may include smoke, chemicals, and fumes. Stay active, but remember to rest as well. Learn and use tips on how to manage stress and control your breathing. Make sure you get enough sleep. Most adults need at least 7 hours of sleep every night. Eat healthy foods. Eat smaller meals more often. Rest before meals. Controlled breathing Learn and use tips on how to control your breathing as told by your doctor. Try: Breathing in (inhaling) through your nose for 1 second. Then, pucker your lips and breath out (exhale) through your lips for 2 seconds. Putting one hand on your belly (abdomen). Breathe in slowly through your nose for 1 second. Your hand on your belly should move out. Pucker your lips and breathe out slowly through your lips. Your hand on your belly should move in as you breathe out.  Controlled coughing Learn and use controlled coughing to clear mucus from your lungs. Follow these steps: Lean your head a little forward. Breathe in deeply. Try to hold your breath for 3 seconds. Keep your mouth slightly open while coughing 2 times. Spit any mucus out into a tissue. Rest and do the steps again 1 or 2 times as needed. General instructions Make sure you get all the shots (vaccines) that your doctor recommends. Ask your doctor about a flu shot and a pneumonia shot. Use oxygen therapy and pulmonary rehabilitation if told by your doctor. If you need home oxygen therapy, ask your doctor if you should buy a tool to measure your oxygen level (oximeter). Make a COPD action plan with your doctor. This helps you   to know what to do if you feel worse than usual. Manage any other conditions you have as told by your doctor. Avoid going outside when it is very hot, cold, or humid. Avoid people who have a sickness you can catch (contagious). Keep all follow-up  visits. Contact a doctor if: You cough up more mucus than usual. There is a change in the color or thickness of the mucus. It is harder to breathe than usual. Your breathing is faster than usual. You have trouble sleeping. You need to use your medicines more often than usual. You have trouble doing your normal activities such as getting dressed or walking around the house. Get help right away if: You have shortness of breath while resting. You have shortness of breath that stops you from: Being able to talk. Doing normal activities. Your chest hurts for longer than 5 minutes. Your skin color is more blue than usual. Your pulse oximeter shows that you have low oxygen for longer than 5 minutes. You have a fever. You feel too tired to breathe normally. These symptoms may represent a serious problem that is an emergency. Do not wait to see if the symptoms will go away. Get medical help right away. Call your local emergency services (911 in the [place] Do not drive yourself to the hospital. Summary Chronic obstructive pulmonary disease (COPD) is a long-term lung problem. The way your lungs work will never return to normal. Usually the condition gets worse over time. There are things you can do to keep yourself as healthy as possible. Take over-the-counter and prescription medicines only as told by your doctor. If you smoke, stop. Smoking makes the problem worse. This information is not intended to replace advice given to you by your health care provider. Make sure you discuss any questions you have with your health care provider. Document Revised: 03/18/2020 Document Reviewed: 03/18/2020 Elsevier Patient Education  2023 [employer]  

## 2021-11-02 NOTE — Progress Notes (Unsigned)
[facility],  [address]  Pulmonary Sleep Medicine   Office Visit Note  Patient Name: Briana Snyder DOB: 1965-10-26 MRN [id number]  Date of Service: 11/02/2021  Complaints/HPI: COPD. She is on trelegy feels much better. She states she has not been needing the rescue as much. She otherwise is tolerating the inhalers. No cough noted no congestion. She has not had any issues with chest pain. Her spirometry is much improved from the previous  ROS  General: (-) fever, (-) chills, (-) night sweats, (-) weakness Skin: (-) rashes, (-) itching,. Eyes: (-) visual changes, (-) redness, (-) itching. Nose and Sinuses: (-) nasal stuffiness or itchiness, (-) postnasal drip, (-) nosebleeds, (-) sinus trouble. Mouth and Throat: (-) sore throat, (-) hoarseness. Neck: (-) swollen glands, (-) enlarged thyroid, (-) neck pain. Respiratory: - cough, (-) bloody sputum, - shortness of breath, - wheezing. Cardiovascular: - ankle swelling, (-) chest pain. Lymphatic: (-) lymph node enlargement. Neurologic: (-) numbness, (-) tingling. Psychiatric: (-) anxiety, (-) depression   Current Medication: Outpatient Encounter Medications as of 11/02/2021  Medication Sig   albuterol (PROVENTIL) (2.5 MG/3ML) 0.083% nebulizer solution Take 3 mLs (2.5 mg total) by nebulization every 6 (six) hours as needed for wheezing or shortness of breath.   albuterol (VENTOLIN HFA) 108 (90 Base) MCG/ACT inhaler Inhale 2 puffs into the lungs every 6 (six) hours as needed for wheezing or shortness of breath.   amLODipine (NORVASC) 2.5 MG tablet TAKE 1 TABLET(2.5 MG) BY MOUTH DAILY   busPIRone (BUSPAR) 10 MG tablet Take 1 tablet (10 mg total) by mouth 2 (two) times daily.   cyanocobalamin (,VITAMIN B-12,) 1000 MCG/ML injection Inject once a week for 3 weeks then once a month   fluticasone (FLONASE) 50 MCG/ACT nasal spray Place 1 spray into both nostrils as needed.   Fluticasone-Umeclidin-Vilant  (TRELEGY ELLIPTA) 100-62.5-25 MCG/ACT AEPB Inhale 1 puff into the lungs daily.   hydrocortisone 1 % ointment Apply 1 application topically 2 (two) times daily.   meloxicam (MOBIC) 7.5 MG tablet Take 1 tablet (7.5 mg total) by mouth daily.   triamcinolone ointment (KENALOG) 0.5 % Apply 1 application topically 2 (two) times daily.   No facility-administered encounter medications on file as of 11/02/2021.    Surgical History: Past Surgical History:  Procedure Laterality Date   BREAST BIOPSY Left 2012   FIBROADENOMA, 1.0 CM.    BREAST LUMPECTOMY Left 2012   COLONOSCOPY N/A 01/28/2015   Procedure: COLONOSCOPY;  Surgeon: Christene Lye, MD;  Location: [facility] ENDOSCOPY;  Service: Endoscopy;  Laterality: N/A;   endometrial polyps  08/2010   resected   HERNIA REPAIR  2000   inguinal    LAPAROSCOPIC RIGHT HEMI COLECTOMY Right 02/25/2015   Procedure: LAPAROSCOPIC RIGHT HEMI COLECTOMY;  Surgeon: Christene Lye, MD;  Location: [facility] ORS;  Service: General;  Laterality: Right;   THYROIDECTOMY  1987   Right   VARICOSE VEIN SURGERY      Medical History: Past Medical History:  Diagnosis Date   Anemia    Anxiety    Asthma    B12 deficiency    Benign neoplasm of cecum    Cancer (HCC)    T2,    GERD (gastroesophageal reflux disease)    Headache    Hypertension    Leukopenia     Family History: Family History  Problem Relation Age of Onset   Hypertension Mother    Breast cancer Mother 49   Prostate cancer Father  Social History: Social History   Socioeconomic History   Marital status: Divorced    Spouse name: Not on file   Number of children: Not on file   Years of education: Not on file   Highest education level: Not on file  Occupational History   Not on file  Tobacco Use   Smoking status: Former    Packs/day: 0.50    Years: 18.00    Total pack years: 9.00    Types: Cigarettes    Quit date: 05/25/2011    Years since quitting: 10.4    Passive exposure: Never    Smokeless tobacco: Never  Vaping Use   Vaping Use: Never used  Substance and Sexual Activity   Alcohol use: Yes    Comment: 1-2 cans of  beer almost every day   Drug use: No   Sexual activity: Not on file  Other Topics Concern   Not on file  Social History Narrative   Not on file   Social Determinants of Health   Financial Resource Strain: Not on file  Food Insecurity: Not on file  Transportation Needs: Not on file  Physical Activity: Not on file  Stress: Not on file  Social Connections: Not on file  Intimate Partner Violence: Not on file    Vital Signs: Blood pressure 116/81, pulse 88, temperature 98.4 F (36.9 C), resp. rate 16, height 5' 6.5" (1.689 m), weight 182 lb (82.6 kg), last menstrual period 02/08/2015, SpO2 98 %.  Examination: General Appearance: The patient is well-developed, well-nourished, and in no distress. Skin: Gross inspection of skin unremarkable. Head: normocephalic, no gross deformities. Eyes: no gross deformities noted. ENT: ears appear grossly normal no exudates. Neck: Supple. No thyromegaly. No LAD. Respiratory: no rhonchi . Cardiovascular: Normal S1 and S2 without murmur or rub. Extremities: No cyanosis. pulses are equal. Neurologic: Alert and oriented. No involuntary movements.  LABS: Recent Results (from the past 2160 hour(s))  IGP, Aptima HPV     Status: Abnormal   Collection Time: 10/12/21  3:32 PM  Result Value Ref Range   Interpretation EPCA,LSILB (A)     Comment: EPITHELIAL CELL ABNORMALITY. LOW GRADE SQUAMOUS INTRAEPITHELIAL LESION (LSIL).    Category LSIL (A)     Comment: Low-Grade Squamous Intraepithelial Lesion   Adequacy SDER     Comment: Satisfactory for evaluation.   Clinician Provided ICD10 Comment     Comment: Z12.4   Performed by: Comment     Comment: Lamar Benes, Cytotechnologist (ASCP)   Electronically signed by: Comment     Comment: Sidney Ace, MD, Pathologist   PATHOLOGIST PROVIDED ICD10: Comment      Comment: X90.240   Note: Comment     Comment: The Pap smear is a screening test designed to aid in the detection of premalignant and malignant conditions of the uterine cervix.  It is not a diagnostic procedure and should not be used as the sole means of detecting cervical cancer.  Both false-positive and false-negative reports do occur.    Test Methodology Comment     Comment: This liquid based ThinPrep(R) pap test was screened with the use of an image guided system.    HPV Aptima Positive (A) Negative    Comment: This nucleic acid amplification test detects fourteen high-risk HPV types (16,18,31,33,35,39,45,51,52,56,58,59,66,68) without differentiation.   NuSwab Vaginitis Plus (VG+)     Status: None   Collection Time: 10/12/21  3:32 PM  Result Value Ref Range   Atopobium vaginae Low - 0 Score  BVAB 2 Low - 0 Score   Megasphaera 1 Low - 0 Score    Comment: Calculate total score by adding the 3 individual bacterial vaginosis (BV) marker scores together.  Total score is interpreted as follows: Total score 0-1: Indicates the absence of BV. Total score   2: Indeterminate for BV. Additional clinical                  data should be evaluated to establish a                  diagnosis. Total score 3-6: Indicates the presence of BV. This test was developed and its performance characteristics determined by [facility].  It has not been cleared or approved by the Food and Drug Administration.    Candida albicans, NAA Negative Negative   Candida glabrata, NAA Negative Negative   Trich vag by NAA Negative Negative   Chlamydia trachomatis, NAA Negative Negative   Neisseria gonorrhoeae, NAA Negative Negative    Radiology: DG Hip Unilat W OR W/O Pelvis 2-3 Views Left  Result Date: 09/10/2021 CLINICAL DATA:  Left hip pain. EXAM: DG HIP (WITH OR WITHOUT PELVIS) 2-3V LEFT COMPARISON:  None. FINDINGS: Mild degenerative changes in the right hip with small osteophytes but no loss of joint space.  Significant degenerative changes in the left hip with loss of joint space superiorly and associated subchondral cysts and bony sclerosis. Osteophytes identified. Periosteal reaction along the medial aspect of the femoral neck and head appears chronic in appearance. No acute fractures or dislocations are identified. No other significant abnormalities. IMPRESSION: 1. 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 Chronic periosteal reaction along the femoral neck. 2. Mild degenerative changes in the right hip without loss of joint space. Electronically Signed   By: Dorise Bullion III M.D.   On: 09/10/2021 13:50    No results found.  No results found.    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 ([place]) 12/07/2019   Dysuria 12/07/2019   Essential hypertension 10/22/2019   Atopic dermatitis, mild 08/07/2017   Vaginal candidiasis 08/07/2017   Episodic cluster headache, not intractable 08/07/2017   Mild intermittent asthma 08/07/2017   Encounter for general adult medical examination with abnormal findings 08/07/2017   Abnormal Papanicolaou smear of cervix with positive human papilloma virus (HPV) test 08/07/2017   Allergic rhinitis 08/07/2017   Gastroesophageal reflux disease without esophagitis 08/07/2017   GAD (generalized anxiety disorder) 08/07/2017   Vitamin D deficiency 08/07/2017   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 Chronic obstructive pulmonary disease, unspecified COPD type (HCC) ***  2. SOB (shortness of breath) *** - Spirometry with Graph  3. Obesity, morbid ([place]) ***   General Counseling: I have discussed the findings of the evaluation and examination with Briana Snyder.  I have also discussed any further diagnostic  evaluation thatmay be needed or ordered today. Briana Snyder verbalizes understanding of the findings of todays visit. We also reviewed her medications today and discussed drug interactions and side effects including but not limited excessive drowsiness and altered mental states. We also discussed that there is always a risk not just to her but also people around her. she has been encouraged to call the office with any questions or concerns that should arise  related to todays visit.  Orders Placed This Encounter  Procedures   Spirometry with Graph    Order Specific Question:   Where should this test be performed?    Answer:   [facility]    Order Specific Question:   Basic spirometry    Answer:   Yes     Time spent: 31  I have personally obtained a history, examined the patient, evaluated laboratory and imaging results, formulated the assessment and plan and placed orders.    Allyne Gee, MD [facility] Pulmonary and Critical Care Sleep medicine

## 2021-12-01 ENCOUNTER — Telehealth: Payer: Self-pay

## 2021-12-01 NOTE — Telephone Encounter (Signed)
Pt called and was stating that for over 2 weeks she has been having blurry eyes and tension in her eyes while on vacation 11/16/21 to 11/20/21.  Pt did miss several doses of her buspirone while on vacation and was worried that was what caused her symptoms.  One night last week pt felt like there was blood in her head almost like a balloon blowing up.  Also patient has anxiety and every since vacation she has had anxiety attacks while she is asleep.  I told pt she should go to the ER but pt advised she works at the [employer] and she doesn't want to stay there all night waiting to be seen.  I called and spoke to Dr Humphrey Rolls and she advised that patient monitor her symptoms and if she seems toget worse or has any changes in speech, or continues to have blurred vision that she needs to be seen at hospital otherwise we made patient an appt on Thursday 12/03/21 at 3:20 pm  Patient will let us know if she has been seen at ER before her appt.

## 2021-12-03 ENCOUNTER — Ambulatory Visit: Payer: 59 | Admitting: Internal Medicine

## 2021-12-03 ENCOUNTER — Telehealth: Payer: Self-pay

## 2021-12-03 NOTE — Telephone Encounter (Signed)
Pt LMOM that she is feeling much better and that her head is feeling back to normal and the medication is working.  Pt advised that she doesn't need a CT scan now and she stated in message to cancel her appt for today.  12/03/21

## 2021-12-07 ENCOUNTER — Ambulatory Visit: Payer: 59 | Admitting: Obstetrics & Gynecology

## 2021-12-11 ENCOUNTER — Other Ambulatory Visit: Payer: Self-pay | Admitting: Internal Medicine

## 2021-12-11 DIAGNOSIS — M545 Low back pain, unspecified: Secondary | ICD-10-CM

## 2021-12-14 ENCOUNTER — Ambulatory Visit (INDEPENDENT_AMBULATORY_CARE_PROVIDER_SITE_OTHER): Payer: 59 | Admitting: Physician Assistant

## 2021-12-14 ENCOUNTER — Telehealth: Payer: Self-pay

## 2021-12-14 ENCOUNTER — Encounter: Payer: Self-pay | Admitting: Physician Assistant

## 2021-12-14 VITALS — BP 116/81 | HR 82 | Temp 98.4°F | Resp 16 | Ht 66.5 in | Wt 181.0 lb

## 2021-12-14 DIAGNOSIS — G4710 Hypersomnia, unspecified: Secondary | ICD-10-CM | POA: Diagnosis not present

## 2021-12-14 DIAGNOSIS — R519 Headache, unspecified: Secondary | ICD-10-CM

## 2021-12-14 DIAGNOSIS — I10 Essential (primary) hypertension: Secondary | ICD-10-CM | POA: Diagnosis not present

## 2021-12-14 DIAGNOSIS — H539 Unspecified visual disturbance: Secondary | ICD-10-CM

## 2021-12-14 NOTE — Telephone Encounter (Signed)
Awaiting 12/14/21 office notes for SS order-Toni

## 2021-12-14 NOTE — Progress Notes (Signed)
[facility] [place], [place] [address]  Internal MEDICINE  Office Visit Note  Patient Name: Briana Snyder  [id number]  [id number]  Date of Service: 12/17/2021  Chief Complaint  Patient presents with   Headache    Pt would like a CT scan - symptoms have not improved   Loss of Vision    Has trouble with eye focus     HPI Pt is here for a sick visit. -Reports she felt like something in her head was expanding or "balloon blowing up in head." Has been having blurry vision. Is planning to go have eye exam today. Symptoms first started a few weeks ago when she was out of her buspar while out of town. She reports similar feelings when she has been without it before. Since being back on it the balloon feeling has gone away. She does still have some blurred vision though and is seeing eye doctor about this. Thinks may be due to sleep apnea, but also worried about other causes and potentially getting imaging to look further. Discussed since it seems to be improving being back on meds, will start with eye doctor visit and sleep study -She is ready to move forward with sleep study previously ordered, but would like to do HST as she cant leave her daughter home alone. -Mouth dry at night and startling awake. Snoring and waking herself up. Feels fatigued during the day and has been having daily headaches. -Discussed headaches can be due to untreated OSA therefore will move forward with testing and strating on treatment if indicated to see if this helps -If acute worsening or new symptoms arise, she was instructed to go to ED as imaging may be indicated  Current Medication:  Outpatient Encounter Medications as of 12/14/2021  Medication Sig   albuterol (PROVENTIL) (2.5 MG/3ML) 0.083% nebulizer solution Take 3 mLs (2.5 mg total) by nebulization every 6 (six) hours as needed for wheezing or shortness of breath.   albuterol (VENTOLIN HFA) 108 (90 Base) MCG/ACT inhaler Inhale 2  puffs into the lungs every 6 (six) hours as needed for wheezing or shortness of breath.   amLODipine (NORVASC) 2.5 MG tablet TAKE 1 TABLET(2.5 MG) BY MOUTH DAILY   busPIRone (BUSPAR) 10 MG tablet Take 1 tablet (10 mg total) by mouth 2 (two) times daily.   cyanocobalamin (,VITAMIN B-12,) 1000 MCG/ML injection Inject once a week for 3 weeks then once a month   fluticasone (FLONASE) 50 MCG/ACT nasal spray Place 1 spray into both nostrils as needed.   Fluticasone-Umeclidin-Vilant (TRELEGY ELLIPTA) 100-62.5-25 MCG/ACT AEPB Inhale 1 puff into the lungs daily.   hydrocortisone 1 % ointment Apply 1 application topically 2 (two) times daily.   meloxicam (MOBIC) 7.5 MG tablet TAKE 1 TABLET(7.5 MG) BY MOUTH DAILY   triamcinolone ointment (KENALOG) 0.5 % Apply 1 application topically 2 (two) times daily.   No facility-administered encounter medications on file as of 12/14/2021.      Medical History: Past Medical History:  Diagnosis Date   Anemia    Anxiety    Asthma    B12 deficiency    Benign neoplasm of cecum    Cancer (HCC)    T2,    GERD (gastroesophageal reflux disease)    Headache    Hypertension    Leukopenia      Vital Signs: BP 116/81   Pulse 82   Temp 98.4 F (36.9 C)   Resp 16   Ht 5' 6.5" (1.689 m)  Wt 181 lb (82.1 kg)   LMP 02/08/2015   SpO2 99%   BMI 28.78 kg/m    Review of Systems  Constitutional:  Positive for fatigue. Negative for fever.  HENT:  Negative for congestion, mouth sores and postnasal drip.   Eyes:  Positive for visual disturbance.  Respiratory:  Negative for cough.   Cardiovascular:  Negative for chest pain.  Genitourinary:  Negative for flank pain.  Musculoskeletal:  Positive for arthralgias.  Neurological:  Positive for headaches.  Psychiatric/Behavioral:  Positive for sleep disturbance. The patient is nervous/anxious.     Physical Exam Vitals and nursing note reviewed.  Constitutional:      General: She is not in acute distress.     Appearance: She is well-developed. She is not diaphoretic.  HENT:     Head: Normocephalic and atraumatic.     Mouth/Throat:     Pharynx: No oropharyngeal exudate.  Eyes:     Extraocular Movements: Extraocular movements intact.     Pupils: Pupils are equal, round, and reactive to light.  Neck:     Thyroid: No thyromegaly.     Vascular: No JVD.     Trachea: No tracheal deviation.  Cardiovascular:     Rate and Rhythm: Normal rate and regular rhythm.     Heart sounds: Normal heart sounds. No murmur heard.    No friction rub. No gallop.  Pulmonary:     Effort: Pulmonary effort is normal. No respiratory distress.     Breath sounds: No wheezing or rales.  Chest:     Chest wall: No tenderness.  Abdominal:     General: Bowel sounds are normal.     Palpations: Abdomen is soft.  Musculoskeletal:        General: Normal range of motion.     Cervical back: Normal range of motion and neck supple.  Lymphadenopathy:     Cervical: No cervical adenopathy.  Skin:    General: Skin is warm and dry.  Neurological:     General: No focal deficit present.     Mental Status: She is alert and oriented to person, place, and time.     Cranial Nerves: No cranial nerve deficit.     Motor: No weakness.     Gait: Gait normal.  Psychiatric:        Behavior: Behavior normal.        Thought Content: Thought content normal.        Judgment: Judgment normal.       Assessment/Plan: 1. Hypersomnia Due to snoring, gasping awake, fatigue, and headaches, will order HST for further evaluation - Home sleep test  2. Essential hypertension Well controlled, continue current medication  3. Nonintractable episodic headache, unspecified headache type Possibly due to underlying OSA and will evaluate for this. If acute worsening or new symptoms arise then advised to go to ED.  4. Vision changes Is going to eye doctor. If acute worsening or new symptoms then will go to ED   General Counseling: Briana Snyder  verbalizes understanding of the findings of todays visit and agrees with plan of treatment. I have discussed any further diagnostic evaluation that may be needed or ordered today. We also reviewed her medications today. she has been encouraged to call the office with any questions or concerns that should arise related to todays visit.    Counseling:    Orders Placed This Encounter  Procedures   Home sleep test    No orders of the defined types were placed  in this encounter.   Time spent:30 Minutes

## 2021-12-17 ENCOUNTER — Telehealth: Payer: Self-pay

## 2021-12-17 NOTE — Telephone Encounter (Signed)
SS order placed in [facility] folder-Toni 

## 2021-12-23 ENCOUNTER — Inpatient Hospital Stay: Payer: 59

## 2021-12-25 ENCOUNTER — Inpatient Hospital Stay: Payer: 59 | Admitting: Oncology

## 2021-12-29 ENCOUNTER — Telehealth: Payer: Self-pay

## 2021-12-29 NOTE — Telephone Encounter (Signed)
SS appointment>> 01/12/22 for home study-Briana Snyder

## 2022-01-04 ENCOUNTER — Other Ambulatory Visit (HOSPITAL_COMMUNITY)
Admission: RE | Admit: 2022-01-04 | Discharge: 2022-01-04 | Disposition: A | Discharge status: Home / Self Care | Payer: 59 | Source: Ambulatory Visit | Attending: Obstetrics & Gynecology | Admitting: Obstetrics & Gynecology

## 2022-01-04 ENCOUNTER — Encounter: Payer: Self-pay | Admitting: Obstetrics & Gynecology

## 2022-01-04 ENCOUNTER — Ambulatory Visit (INDEPENDENT_AMBULATORY_CARE_PROVIDER_SITE_OTHER): Payer: 59 | Admitting: Obstetrics & Gynecology

## 2022-01-04 VITALS — BP 120/80 | HR 79 | Ht 66.0 in | Wt 184.0 lb

## 2022-01-04 DIAGNOSIS — R87612 Low grade squamous intraepithelial lesion on cytologic smear of cervix (LGSIL): Secondary | ICD-10-CM | POA: Insufficient documentation

## 2022-01-04 NOTE — Progress Notes (Signed)
   Subjective:    Patient ID: Briana Snyder, female    DOB: 1966-04-27, 56 y.o.   MRN: [id number]  HPI 56 yo single P2 (52 and 73 yo kids) here for a colpo due to a LGSIL pap. She has not been seen in this office for many years. She had a LGSIL pap in 2021 but denies having any cervical treatments. She thinks that in the past she had a fibroid that was removed.   Review of Systems She went through menopause around age 56 and reports that she is abstinent. She has worked for [employer] for about 2 years.     Objective:   Physical Exam Well nourished, well hydrated Black female, no apparent distress She is ambulating and conversing normally. UPT negative, consent signed, time out done Cervix prepped with acetic acid. Transformation zone seen in its entirety. Colpo adequate. Entirely normal colposcopy. ECC obtained. She tolerated the procedure well.       Assessment & Plan:

## 2022-01-05 ENCOUNTER — Encounter: Payer: Self-pay | Admitting: Obstetrics & Gynecology

## 2022-01-06 LAB — SURGICAL PATHOLOGY

## 2022-01-11 ENCOUNTER — Ambulatory Visit: Payer: 59 | Admitting: Physician Assistant

## 2022-01-11 ENCOUNTER — Encounter: Payer: Self-pay | Admitting: Obstetrics & Gynecology

## 2022-01-15 ENCOUNTER — Telehealth: Payer: Self-pay

## 2022-01-15 ENCOUNTER — Other Ambulatory Visit: Payer: Self-pay | Admitting: Physician Assistant

## 2022-01-15 DIAGNOSIS — G4710 Hypersomnia, unspecified: Secondary | ICD-10-CM

## 2022-01-15 NOTE — Telephone Encounter (Signed)
Home SS order faxed to [place]

## 2022-02-08 ENCOUNTER — Telehealth: Payer: Self-pay | Admitting: Physician Assistant

## 2022-02-08 NOTE — Telephone Encounter (Signed)
Faxed appointment update request to [place]

## 2022-02-26 ENCOUNTER — Telehealth: Payer: Self-pay | Admitting: Physician Assistant

## 2022-02-26 NOTE — Telephone Encounter (Signed)
Patient stated she has not heard from center to schedule appointment. She will call her insurance to see what other sleep labs accept her insurance. I left vm for [facility] to return my call asap-Toni

## 2022-03-30 ENCOUNTER — Telehealth: Payer: Self-pay | Admitting: Physician Assistant

## 2022-03-30 NOTE — Telephone Encounter (Signed)
Home SS faxed to new location for [facility]-Toni

## 2022-03-31 ENCOUNTER — Other Ambulatory Visit: Payer: Self-pay | Admitting: Internal Medicine

## 2022-03-31 DIAGNOSIS — J449 Chronic obstructive pulmonary disease, unspecified: Secondary | ICD-10-CM

## 2022-04-08 ENCOUNTER — Encounter: Payer: 59 | Admitting: Physician Assistant

## 2022-04-14 ENCOUNTER — Ambulatory Visit
Admission: RE | Admit: 2022-04-14 | Discharge: 2022-04-14 | Disposition: A | Discharge status: Home / Self Care | Payer: 59 | Source: Ambulatory Visit | Attending: Physician Assistant | Admitting: Physician Assistant

## 2022-04-14 DIAGNOSIS — Z1231 Encounter for screening mammogram for malignant neoplasm of breast: Secondary | ICD-10-CM | POA: Diagnosis not present

## 2022-04-19 ENCOUNTER — Other Ambulatory Visit: Payer: Self-pay | Admitting: Physician Assistant

## 2022-04-19 DIAGNOSIS — R921 Mammographic calcification found on diagnostic imaging of breast: Secondary | ICD-10-CM

## 2022-04-19 DIAGNOSIS — N6489 Other specified disorders of breast: Secondary | ICD-10-CM

## 2022-04-19 DIAGNOSIS — R928 Other abnormal and inconclusive findings on diagnostic imaging of breast: Secondary | ICD-10-CM

## 2022-04-22 ENCOUNTER — Encounter: Payer: 59 | Admitting: Physician Assistant

## 2022-05-03 ENCOUNTER — Ambulatory Visit: Payer: 59 | Admitting: Internal Medicine

## 2022-05-13 ENCOUNTER — Other Ambulatory Visit: Payer: Self-pay | Admitting: Physician Assistant

## 2022-05-13 DIAGNOSIS — I10 Essential (primary) hypertension: Secondary | ICD-10-CM

## 2022-05-13 NOTE — Telephone Encounter (Signed)
PT NEED APPT FOR REFILLS  

## 2022-05-31 ENCOUNTER — Other Ambulatory Visit: Payer: 59

## 2022-06-07 ENCOUNTER — Ambulatory Visit
Admission: RE | Admit: 2022-06-07 | Discharge: 2022-06-07 | Disposition: A | Discharge status: Home / Self Care | Payer: 59 | Source: Ambulatory Visit | Attending: Physician Assistant | Admitting: Physician Assistant

## 2022-06-07 DIAGNOSIS — N6489 Other specified disorders of breast: Secondary | ICD-10-CM

## 2022-06-07 DIAGNOSIS — R928 Other abnormal and inconclusive findings on diagnostic imaging of breast: Secondary | ICD-10-CM

## 2022-06-07 DIAGNOSIS — R921 Mammographic calcification found on diagnostic imaging of breast: Secondary | ICD-10-CM

## 2022-06-10 ENCOUNTER — Telehealth: Payer: Self-pay | Admitting: Physician Assistant

## 2022-06-10 ENCOUNTER — Other Ambulatory Visit: Payer: Self-pay | Admitting: Physician Assistant

## 2022-06-10 DIAGNOSIS — I10 Essential (primary) hypertension: Secondary | ICD-10-CM

## 2022-06-10 NOTE — Telephone Encounter (Signed)
Pt need appt for refills  ?

## 2022-06-10 NOTE — Telephone Encounter (Signed)
Called and lvm pt to schedule follow up appointment for medication refill

## 2022-06-14 ENCOUNTER — Other Ambulatory Visit: Payer: Self-pay | Admitting: Physician Assistant

## 2022-06-14 DIAGNOSIS — M545 Low back pain, unspecified: Secondary | ICD-10-CM

## 2022-07-04 ENCOUNTER — Other Ambulatory Visit: Payer: Self-pay | Admitting: Physician Assistant

## 2022-07-04 DIAGNOSIS — I10 Essential (primary) hypertension: Secondary | ICD-10-CM

## 2022-07-05 ENCOUNTER — Telehealth: Payer: Self-pay | Admitting: Physician Assistant

## 2022-07-05 NOTE — Telephone Encounter (Signed)
Appointments have been made. She either cancels or no shows.

## 2022-07-05 NOTE — Telephone Encounter (Signed)
Lvm and sent message to schedule follow up and cpe-tw

## 2022-07-07 ENCOUNTER — Other Ambulatory Visit: Payer: Self-pay | Admitting: Physician Assistant

## 2022-07-07 ENCOUNTER — Telehealth: Payer: Self-pay

## 2022-07-07 DIAGNOSIS — M545 Low back pain, unspecified: Secondary | ICD-10-CM

## 2022-07-07 NOTE — Telephone Encounter (Signed)
Pt called for sample for trelegy 100 mcg she had appt Monday advised by toni that sample will ready at front desk

## 2022-07-12 ENCOUNTER — Encounter: Payer: Self-pay | Admitting: Physician Assistant

## 2022-07-12 ENCOUNTER — Ambulatory Visit (INDEPENDENT_AMBULATORY_CARE_PROVIDER_SITE_OTHER): Payer: 59 | Admitting: Physician Assistant

## 2022-07-12 DIAGNOSIS — M545 Low back pain, unspecified: Secondary | ICD-10-CM

## 2022-07-12 DIAGNOSIS — I10 Essential (primary) hypertension: Secondary | ICD-10-CM

## 2022-07-12 DIAGNOSIS — J449 Chronic obstructive pulmonary disease, unspecified: Secondary | ICD-10-CM

## 2022-07-12 DIAGNOSIS — F411 Generalized anxiety disorder: Secondary | ICD-10-CM

## 2022-07-12 MED ORDER — ALBUTEROL SULFATE HFA 108 (90 BASE) MCG/ACT IN AERS: INHALATION_SPRAY | RESPIRATORY_TRACT | 7 refills | Status: DC

## 2022-07-12 MED ORDER — BUSPIRONE HCL 10 MG PO TABS
10.0000 mg | ORAL_TABLET | Freq: Two times a day (BID) | ORAL | 1 refills | Status: DC
  Filled 2023-01-12: qty 180, 90d supply, fill #0
  Filled 2023-04-15: qty 180, 90d supply, fill #1

## 2022-07-12 MED ORDER — TRELEGY ELLIPTA 100-62.5-25 MCG/ACT IN AEPB
1.0000 | INHALATION_SPRAY | Freq: Every day | RESPIRATORY_TRACT | 11 refills | Status: DC
  Filled 2022-08-13: qty 60, 30d supply, fill #0
  Filled 2022-09-13: qty 60, 30d supply, fill #1
  Filled 2022-10-12: qty 60, 30d supply, fill #2
  Filled 2022-11-05: qty 60, 30d supply, fill #3
  Filled 2022-12-03: qty 60, 30d supply, fill #4
  Filled 2023-01-05: qty 60, 30d supply, fill #5
  Filled 2023-02-03 (×2): qty 60, 30d supply, fill #6
  Filled 2023-03-07: qty 60, 30d supply, fill #7
  Filled 2023-04-15: qty 60, 30d supply, fill #8
  Filled 2023-06-22 (×2): qty 60, 30d supply, fill #9

## 2022-07-12 MED ORDER — MELOXICAM 7.5 MG PO TABS
ORAL_TABLET | ORAL | 1 refills | Status: DC
  Filled 2022-08-13: qty 90, 90d supply, fill #0
  Filled 2022-11-11: qty 60, 60d supply, fill #1

## 2022-07-12 MED ORDER — ZOSTER VAC RECOMB ADJUVANTED 50 MCG/0.5ML IM SUSR: 0.5000 mL | Freq: Once | INTRAMUSCULAR | 0 refills | Status: AC

## 2022-07-12 MED ORDER — AMLODIPINE BESYLATE 2.5 MG PO TABS
2.5000 mg | ORAL_TABLET | Freq: Every day | ORAL | 1 refills | Status: DC
  Filled 2022-08-26: qty 90, 90d supply, fill #0
  Filled 2022-11-24: qty 90, 90d supply, fill #1

## 2022-07-12 MED ORDER — TETANUS-DIPHTH-ACELL PERTUSSIS 5-2.5-18.5 LF-MCG/0.5 IM SUSP: 0.5000 mL | Freq: Once | INTRAMUSCULAR | 0 refills | Status: AC

## 2022-07-12 NOTE — Progress Notes (Signed)
[facility] [place], [place] [address]  Internal MEDICINE  Office Visit Note  Patient Name: Briana Snyder  [id number]  [id number]  Date of Service: 07/12/2022  Chief Complaint  Patient presents with   Follow-up   Anxiety   Hypertension    HPI Pt is here for routine follow up -BP has stable -breathing has been good, really likes trelegy -May be having hip surgery -Had mammogram with [place] and was told to repeat in 6 months -Going to be having HST, Briana Snyder was told they are going to mail it to her  Current Medication: Outpatient Encounter Medications as of 07/12/2022  Medication Sig   albuterol (PROVENTIL) (2.5 MG/3ML) 0.083% nebulizer solution Take 3 mLs (2.5 mg total) by nebulization every 6 (six) hours as needed for wheezing or shortness of breath.   cyanocobalamin (,VITAMIN B-12,) 1000 MCG/ML injection Inject once a week for 3 weeks then once a month   fluticasone (FLONASE) 50 MCG/ACT nasal spray Place 1 spray into both nostrils as needed.   hydrocortisone 1 % ointment Apply 1 application topically 2 (two) times daily.   triamcinolone ointment (KENALOG) 0.5 % Apply 1 application topically 2 (two) times daily.   [DISCONTINUED] albuterol (VENTOLIN HFA) 108 (90 Base) MCG/ACT inhaler INHALE 1 PUFF INTO THE LUNGS EVERY 4 HOURS AS NEEDED   [DISCONTINUED] amLODipine (NORVASC) 2.5 MG tablet TAKE 1 TABLET BY MOUTH EVERY DAY   [DISCONTINUED] busPIRone (BUSPAR) 10 MG tablet Take 1 tablet (10 mg total) by mouth 2 (two) times daily.   [DISCONTINUED] Fluticasone-Umeclidin-Vilant (TRELEGY ELLIPTA) 100-62.5-25 MCG/ACT AEPB Inhale 1 puff into the lungs daily.   [DISCONTINUED] meloxicam (MOBIC) 7.5 MG tablet TAKE 1 TABLET(7.5 MG) BY MOUTH DAILY   [DISCONTINUED] Tdap (BOOSTRIX) 5-2.5-18.5 LF-MCG/0.5 injection Inject 0.5 mLs into the muscle once.   [DISCONTINUED] Zoster Vaccine Adjuvanted [facility]) injection Inject 0.5 mLs into the muscle once.   albuterol (VENTOLIN HFA) 108  (90 Base) MCG/ACT inhaler INHALE 1 PUFF INTO THE LUNGS EVERY 4 HOURS AS NEEDED   amLODipine (NORVASC) 2.5 MG tablet Take 1 tablet (2.5 mg total) by mouth daily.   busPIRone (BUSPAR) 10 MG tablet Take 1 tablet (10 mg total) by mouth 2 (two) times daily.   Fluticasone-Umeclidin-Vilant (TRELEGY ELLIPTA) 100-62.5-25 MCG/ACT AEPB Inhale 1 puff into the lungs daily.   meloxicam (MOBIC) 7.5 MG tablet TAKE 1 TABLET(7.5 MG) BY MOUTH DAILY   Tdap (BOOSTRIX) 5-2.5-18.5 LF-MCG/0.5 injection Inject 0.5 mLs into the muscle once for 1 dose.   Zoster Vaccine Adjuvanted [facility]) injection Inject 0.5 mLs into the muscle once for 1 dose.   No facility-administered encounter medications on file as of 07/12/2022.    Surgical History: Past Surgical History:  Procedure Laterality Date   BREAST BIOPSY Left 2012   FIBROADENOMA, 1.0 CM.    BREAST LUMPECTOMY Left 2012   COLONOSCOPY N/A 01/28/2015   Procedure: COLONOSCOPY;  Surgeon: Christene Lye, MD;  Location: [facility] ENDOSCOPY;  Service: Endoscopy;  Laterality: N/A;   endometrial polyps  08/2010   resected   HERNIA REPAIR  2000   inguinal    LAPAROSCOPIC RIGHT HEMI COLECTOMY Right 02/25/2015   Procedure: LAPAROSCOPIC RIGHT HEMI COLECTOMY;  Surgeon: Christene Lye, MD;  Location: [facility] ORS;  Service: General;  Laterality: Right;   THYROIDECTOMY  1987   Right   VARICOSE VEIN SURGERY      Medical History: Past Medical History:  Diagnosis Date   Anemia    Anxiety    Asthma  B12 deficiency    Benign neoplasm of cecum    Cancer (HCC)    T2,    GERD (gastroesophageal reflux disease)    Headache    Hypertension    Leukopenia     Family History: Family History  Problem Relation Age of Onset   Hypertension Mother    Breast cancer Mother 39   Prostate cancer Father     Social History   Socioeconomic History   Marital status: Divorced    Spouse name: Not on file   Number of children: Not on file   Years of education: Not on file    Highest education level: Not on file  Occupational History   Not on file  Tobacco Use   Smoking status: Former    Packs/day: 0.50    Years: 18.00    Total pack years: 9.00    Types: Cigarettes    Quit date: 05/25/2011    Years since quitting: 11.1    Passive exposure: Never   Smokeless tobacco: Never  Vaping Use   Vaping Use: Never used  Substance and Sexual Activity   Alcohol use: Yes    Comment: 1-2 cans of  beer almost every day   Drug use: No   Sexual activity: Not on file  Other Topics Concern   Not on file  Social History Narrative   Not on file   Social Determinants of Health   Financial Resource Strain: Not on file  Food Insecurity: Not on file  Transportation Needs: Not on file  Physical Activity: Not on file  Stress: Not on file  Social Connections: Not on file  Intimate Partner Violence: Not on file      Review of Systems  Constitutional:  Negative for chills, fatigue and unexpected weight change.  HENT:  Negative for congestion, postnasal drip, rhinorrhea, sneezing and sore throat.   Eyes:  Negative for redness.  Respiratory:  Negative for cough, chest tightness and shortness of breath.   Cardiovascular:  Negative for chest pain and palpitations.  Gastrointestinal:  Negative for abdominal pain, constipation, diarrhea, nausea and vomiting.  Genitourinary:  Negative for dysuria and frequency.  Musculoskeletal:  Positive for arthralgias. Negative for back pain, joint swelling and neck pain.  Skin:  Negative for rash.  Neurological: Negative.  Negative for tremors and numbness.  Hematological:  Negative for adenopathy. Does not bruise/bleed easily.  Psychiatric/Behavioral:  Negative for behavioral problems (Depression), sleep disturbance and suicidal ideas. The patient is not nervous/anxious.     Vital Signs: BP 124/84   Pulse 75   Temp 98.4 F (36.9 C)   Resp 16   Ht 5' 6.5" (1.689 m)   Wt 186 lb (84.4 kg)   LMP 02/08/2015   SpO2 97%   BMI 29.57  kg/m    Physical Exam Vitals and nursing note reviewed.  Constitutional:      General: Briana Snyder is not in acute distress.    Appearance: Briana Snyder is well-developed. Briana Snyder is not diaphoretic.  HENT:     Head: Normocephalic and atraumatic.     Mouth/Throat:     Pharynx: No oropharyngeal exudate.  Eyes:     Extraocular Movements: Extraocular movements intact.     Pupils: Pupils are equal, round, and reactive to light.  Neck:     Thyroid: No thyromegaly.     Vascular: No JVD.     Trachea: No tracheal deviation.  Cardiovascular:     Rate and Rhythm: Normal rate and regular rhythm.  Heart sounds: Normal heart sounds. No murmur heard.    No friction rub. No gallop.  Pulmonary:     Effort: Pulmonary effort is normal. No respiratory distress.     Breath sounds: No wheezing or rales.  Chest:     Chest wall: No tenderness.  Abdominal:     General: Bowel sounds are normal.     Palpations: Abdomen is soft.  Musculoskeletal:        General: Normal range of motion.     Cervical back: Normal range of motion and neck supple.  Lymphadenopathy:     Cervical: No cervical adenopathy.  Skin:    General: Skin is warm and dry.  Neurological:     General: No focal deficit present.     Mental Status: Briana Snyder is alert and oriented to person, place, and time.     Cranial Nerves: No cranial nerve deficit.     Motor: No weakness.     Gait: Gait normal.  Psychiatric:        Behavior: Behavior normal.        Thought Content: Thought content normal.        Judgment: Judgment normal.        Assessment/Plan: 1. Essential hypertension Stable, continue current medication - amLODipine (NORVASC) 2.5 MG tablet; Take 1 tablet (2.5 mg total) by mouth daily.  Dispense: 90 tablet; Refill: 1  2. GAD (generalized anxiety disorder) Stable, continue current medication - busPIRone (BUSPAR) 10 MG tablet; Take 1 tablet (10 mg total) by mouth 2 (two) times daily.  Dispense: 180 tablet; Refill: 1  3. Chronic  obstructive pulmonary disease, unspecified COPD type ([place]) - Fluticasone-Umeclidin-Vilant (TRELEGY ELLIPTA) 100-62.5-25 MCG/ACT AEPB; Inhale 1 puff into the lungs daily.  Dispense: 1 each; Refill: 11 - albuterol (VENTOLIN HFA) 108 (90 Base) MCG/ACT inhaler; INHALE 1 PUFF INTO THE LUNGS EVERY 4 HOURS AS NEEDED  Dispense: 8.5 each; Refill: 7  4. Acute bilateral low back pain without sciatica - meloxicam (MOBIC) 7.5 MG tablet; TAKE 1 TABLET(7.5 MG) BY MOUTH DAILY  Dispense: 90 tablet; Refill: 1   General Counseling: Briana Snyder verbalizes understanding of the findings of todays visit and agrees with plan of treatment. I have discussed any further diagnostic evaluation that may be needed or ordered today. We also reviewed her medications today. Briana Snyder has been encouraged to call the office with any questions or concerns that should arise related to todays visit.    No orders of the defined types were placed in this encounter.   Meds ordered this encounter  Medications   Tdap (BOOSTRIX) 5-2.5-18.5 LF-MCG/0.5 injection    Sig: Inject 0.5 mLs into the muscle once for 1 dose.    Dispense:  0.5 mL    Refill:  0   Zoster Vaccine Adjuvanted [facility]) injection    Sig: Inject 0.5 mLs into the muscle once for 1 dose.    Dispense:  0.5 mL    Refill:  0   amLODipine (NORVASC) 2.5 MG tablet    Sig: Take 1 tablet (2.5 mg total) by mouth daily.    Dispense:  90 tablet    Refill:  1    DX Code Needed  .   busPIRone (BUSPAR) 10 MG tablet    Sig: Take 1 tablet (10 mg total) by mouth 2 (two) times daily.    Dispense:  180 tablet    Refill:  1   Fluticasone-Umeclidin-Vilant (TRELEGY ELLIPTA) 100-62.5-25 MCG/ACT AEPB    Sig: Inhale 1 puff into the  lungs daily.    Dispense:  1 each    Refill:  11   albuterol (VENTOLIN HFA) 108 (90 Base) MCG/ACT inhaler    Sig: INHALE 1 PUFF INTO THE LUNGS EVERY 4 HOURS AS NEEDED    Dispense:  8.5 each    Refill:  7   meloxicam (MOBIC) 7.5 MG tablet    Sig: TAKE 1  TABLET(7.5 MG) BY MOUTH DAILY    Dispense:  90 tablet    Refill:  1    This patient was seen by Drema Dallas, PA-C in collaboration with Dr. Clayborn Bigness as a part of collaborative care agreement.   Total time spent:30 Minutes Time spent includes review of chart, medications, test results, and follow up plan with the patient.      Dr Lavera Guise Internal medicine

## 2022-07-14 ENCOUNTER — Telehealth: Payer: Self-pay | Admitting: Physician Assistant

## 2022-07-14 NOTE — Telephone Encounter (Signed)
Lvm and sent mychart message to schedule 6 month follow up-Toni

## 2022-07-21 DIAGNOSIS — G4733 Obstructive sleep apnea (adult) (pediatric): Secondary | ICD-10-CM | POA: Diagnosis not present

## 2022-07-22 ENCOUNTER — Telehealth: Payer: Self-pay | Admitting: Physician Assistant

## 2022-07-22 NOTE — Telephone Encounter (Signed)
Patient has not responded to either message or vm to schedule 07/12/22 follow up. Letter mailed-Toni

## 2022-07-29 ENCOUNTER — Telehealth: Payer: Self-pay | Admitting: Physician Assistant

## 2022-07-29 NOTE — Telephone Encounter (Signed)
Received [facility] home sleep test report for review. Gave to lauren-nm

## 2022-07-30 ENCOUNTER — Telehealth: Payer: Self-pay | Admitting: Physician Assistant

## 2022-07-30 NOTE — Telephone Encounter (Signed)
Lvm to pt regarding scheduling f/u appt per lauren to go over home sleep test report-nm

## 2022-08-09 ENCOUNTER — Encounter: Payer: Self-pay | Admitting: Physician Assistant

## 2022-08-09 ENCOUNTER — Ambulatory Visit (INDEPENDENT_AMBULATORY_CARE_PROVIDER_SITE_OTHER): Payer: 59 | Admitting: Physician Assistant

## 2022-08-09 ENCOUNTER — Telehealth: Payer: Self-pay | Admitting: Physician Assistant

## 2022-08-09 VITALS — BP 125/87 | HR 90 | Temp 98.5°F | Resp 16 | Ht 66.5 in | Wt 183.8 lb

## 2022-08-09 DIAGNOSIS — I10 Essential (primary) hypertension: Secondary | ICD-10-CM

## 2022-08-09 DIAGNOSIS — G4733 Obstructive sleep apnea (adult) (pediatric): Secondary | ICD-10-CM

## 2022-08-09 DIAGNOSIS — J449 Chronic obstructive pulmonary disease, unspecified: Secondary | ICD-10-CM | POA: Diagnosis not present

## 2022-08-09 NOTE — Telephone Encounter (Signed)
Notified Jerome with [facility] of CPAP order-Toni

## 2022-08-09 NOTE — Patient Instructions (Signed)
Living With Sleep Apnea Sleep apnea is a condition in which breathing pauses or becomes shallow during sleep. Sleep apnea is most commonly caused by a collapsed or blocked airway. People with sleep apnea usually snore loudly. They may have times when they gasp and stop breathing for 10 seconds or more during sleep. This may happen many times during the night. The breaks in breathing also interrupt the deep sleep that you need to feel rested. Even if you do not completely wake up from the gaps in breathing, your sleep may not be restful and you feel tired during the day. You may also have a headache in the morning and low energy during the day, and you may feel anxious or depressed. How can sleep apnea affect me? Sleep apnea increases your chances of extreme tiredness during the day (daytime fatigue). It can also increase your risk for health conditions, such as: Heart attack. Stroke. Obesity. Type 2 diabetes. Heart failure. Irregular heartbeat. High blood pressure. If you have daytime fatigue as a result of sleep apnea, you may be more likely to: Perform poorly at school or work. Fall asleep while driving. Have difficulty with attention. Develop depression or anxiety. Have sexual dysfunction. What actions can I take to manage sleep apnea? Sleep apnea treatment  If you were given a device to open your airway while you sleep, use it only as told by your health care provider. You may be given: An oral appliance. This is a custom-made mouthpiece that shifts your lower jaw forward. A continuous positive airway pressure (CPAP) device. This device blows air through a mask when you breathe out (exhale). A nasal expiratory positive airway pressure (EPAP) device. This device has valves that you put into each nostril. A bi-level positive airway pressure (BIPAP) device. This device blows air through a mask when you breathe in (inhale) and breathe out (exhale). You may need surgery if other treatments  do not work for you. Sleep habits Go to sleep and wake up at the same time every day. This helps set your internal clock (circadian rhythm) for sleeping. If you stay up later than usual, such as on weekends, try to get up in the morning within 2 hours of your normal wake time. Try to get at least 7-9 hours of sleep each night. Stop using a computer, tablet, and mobile phone a few hours before bedtime. Do not take long naps during the day. If you nap, limit it to 30 minutes. Have a relaxing bedtime routine. Reading or listening to music may relax you and help you sleep. Use your bedroom only for sleep. Keep your television and computer out of your bedroom. Keep your bedroom cool, dark, and quiet. Use a supportive mattress and pillows. Follow your health care provider's instructions for other changes to sleep habits. Nutrition Do not eat heavy meals in the evening. Do not have caffeine in the later part of the day. The effects of caffeine can last for more than 5 hours. Follow your health care provider's or dietitian's instructions for any diet changes. Lifestyle     Do not drink alcohol before bedtime. Alcohol can cause you to fall asleep at first, but then it can cause you to wake up in the middle of the night and have trouble getting back to sleep. Do not use any products that contain nicotine or tobacco. These products include cigarettes, chewing tobacco, and vaping devices, such as e-cigarettes. If you need help quitting, ask your health care provider. Medicines Take   over-the-counter and prescription medicines only as told by your health care provider. Do not use over-the-counter sleep medicine. You can become dependent on this medicine, and it can make sleep apnea worse. Do not use medicines, such as sedatives and narcotics, unless told by your health care provider. Activity Exercise on most days, but avoid exercising in the evening. Exercising near bedtime can interfere with  sleeping. If possible, spend time outside every day. Natural light helps regulate your circadian rhythm. General information Lose weight if you need to, and maintain a healthy weight. Keep all follow-up visits. This is important. If you are having surgery, make sure to tell your health care provider that you have sleep apnea. You may need to bring your device with you. Where to find more information Learn more about sleep apnea and daytime fatigue from: American Sleep Association: sleepassociation.org National Sleep Foundation: sleepfoundation.org National Heart, Lung, and Blood Institute: nhlbi.nih.gov Summary Sleep apnea is a condition in which breathing pauses or becomes shallow during sleep. Sleep apnea can cause daytime fatigue and other serious health conditions. You may need to wear a device while sleeping to help keep your airway open. If you are having surgery, make sure to tell your health care provider that you have sleep apnea. You may need to bring your device with you. Making changes to sleep habits, diet, lifestyle, and activity can help you manage sleep apnea. This information is not intended to replace advice given to you by your health care provider. Make sure you discuss any questions you have with your health care provider. Document Revised: 12/17/2020 Document Reviewed: 04/18/2020 Elsevier Patient Education  2023 Elsevier Inc.  

## 2022-08-09 NOTE — Progress Notes (Signed)
[facility] [place], [place] [address]  Internal MEDICINE  Office Visit Note  Patient Name: Briana Snyder  [id number]  [id number]  Date of Service: 08/09/2022  Chief Complaint  Patient presents with   Follow-up    Review SS   Gastroesophageal Reflux   Hypertension    HPI Pt is here for routine follow up to review Hst results -Hst showed moderate OSA with an AHI of 19 per hour -it is recommended she start with trial APAP 4-20cm H2O, her lowest oxygen sat was 85% -will move forward with APAP order and did discuss with patient that most insurances have a compliance period that must be met and may have pulmonary visit to review compliance after setup  Current Medication: Outpatient Encounter Medications as of 08/09/2022  Medication Sig   albuterol (PROVENTIL) (2.5 MG/3ML) 0.083% nebulizer solution Take 3 mLs (2.5 mg total) by nebulization every 6 (six) hours as needed for wheezing or shortness of breath.   albuterol (VENTOLIN HFA) 108 (90 Base) MCG/ACT inhaler INHALE 1 PUFF INTO THE LUNGS EVERY 4 HOURS AS NEEDED   amLODipine (NORVASC) 2.5 MG tablet Take 1 tablet (2.5 mg total) by mouth daily.   busPIRone (BUSPAR) 10 MG tablet Take 1 tablet (10 mg total) by mouth 2 (two) times daily.   cyanocobalamin (,VITAMIN B-12,) 1000 MCG/ML injection Inject once a week for 3 weeks then once a month   fluticasone (FLONASE) 50 MCG/ACT nasal spray Place 1 spray into both nostrils as needed.   Fluticasone-Umeclidin-Vilant (TRELEGY ELLIPTA) 100-62.5-25 MCG/ACT AEPB Inhale 1 puff into the lungs daily.   hydrocortisone 1 % ointment Apply 1 application topically 2 (two) times daily.   meloxicam (MOBIC) 7.5 MG tablet TAKE 1 TABLET(7.5 MG) BY MOUTH DAILY   triamcinolone ointment (KENALOG) 0.5 % Apply 1 application topically 2 (two) times daily.   No facility-administered encounter medications on file as of 08/09/2022.    Surgical History: Past Surgical History:  Procedure  Laterality Date   BREAST BIOPSY Left 2012   FIBROADENOMA, 1.0 CM.    BREAST LUMPECTOMY Left 2012   COLONOSCOPY N/A 01/28/2015   Procedure: COLONOSCOPY;  Surgeon: Christene Lye, MD;  Location: [facility] ENDOSCOPY;  Service: Endoscopy;  Laterality: N/A;   endometrial polyps  08/2010   resected   HERNIA REPAIR  2000   inguinal    LAPAROSCOPIC RIGHT HEMI COLECTOMY Right 02/25/2015   Procedure: LAPAROSCOPIC RIGHT HEMI COLECTOMY;  Surgeon: Christene Lye, MD;  Location: [facility] ORS;  Service: General;  Laterality: Right;   THYROIDECTOMY  1987   Right   VARICOSE VEIN SURGERY      Medical History: Past Medical History:  Diagnosis Date   Anemia    Anxiety    Asthma    B12 deficiency    Benign neoplasm of cecum    Cancer (HCC)    T2,    GERD (gastroesophageal reflux disease)    Headache    Hypertension    Leukopenia     Family History: Family History  Problem Relation Age of Onset   Hypertension Mother    Breast cancer Mother 62   Prostate cancer Father     Social History   Socioeconomic History   Marital status: Divorced    Spouse name: Not on file   Number of children: Not on file   Years of education: Not on file   Highest education level: Not on file  Occupational History   Not on file  Tobacco Use  Smoking status: Former    Packs/day: 0.50    Years: 18.00    Additional pack years: 0.00    Total pack years: 9.00    Types: Cigarettes    Quit date: 05/25/2011    Years since quitting: 11.2    Passive exposure: Never   Smokeless tobacco: Never  Vaping Use   Vaping Use: Never used  Substance and Sexual Activity   Alcohol use: Yes    Comment: 1-2 cans of  beer almost every day   Drug use: No   Sexual activity: Not on file  Other Topics Concern   Not on file  Social History Narrative   Not on file   Social Determinants of Health   Financial Resource Strain: Not on file  Food Insecurity: Not on file  Transportation Needs: Not on file  Physical  Activity: Not on file  Stress: Not on file  Social Connections: Not on file  Intimate Partner Violence: Not on file      Review of Systems  Constitutional:  Negative for chills, fatigue and unexpected weight change.  HENT:  Negative for congestion, postnasal drip, rhinorrhea, sneezing and sore throat.   Eyes:  Negative for redness.  Respiratory:  Negative for cough, chest tightness and shortness of breath.   Cardiovascular:  Negative for chest pain and palpitations.  Gastrointestinal:  Negative for abdominal pain, constipation, diarrhea, nausea and vomiting.  Genitourinary:  Negative for dysuria and frequency.  Musculoskeletal:  Positive for arthralgias. Negative for back pain, joint swelling and neck pain.  Skin:  Negative for rash.  Neurological: Negative.  Negative for tremors and numbness.  Hematological:  Negative for adenopathy. Does not bruise/bleed easily.  Psychiatric/Behavioral:  Negative for behavioral problems (Depression), sleep disturbance and suicidal ideas. The patient is not nervous/anxious.     Vital Signs: BP 125/87   Pulse 90   Temp 98.5 F (36.9 C)   Resp 16   Ht 5' 6.5" (1.689 m)   Wt 183 lb 12.8 oz (83.4 kg)   LMP 02/08/2015   SpO2 96%   BMI 29.22 kg/m    Physical Exam Vitals and nursing note reviewed.  Constitutional:      General: She is not in acute distress.    Appearance: She is well-developed. She is not diaphoretic.  HENT:     Head: Normocephalic and atraumatic.     Mouth/Throat:     Pharynx: No oropharyngeal exudate.  Eyes:     Extraocular Movements: Extraocular movements intact.     Pupils: Pupils are equal, round, and reactive to light.  Neck:     Thyroid: No thyromegaly.     Vascular: No JVD.     Trachea: No tracheal deviation.  Cardiovascular:     Rate and Rhythm: Normal rate and regular rhythm.     Heart sounds: Normal heart sounds. No murmur heard.    No friction rub. No gallop.  Pulmonary:     Effort: Pulmonary effort is  normal. No respiratory distress.     Breath sounds: No wheezing or rales.  Chest:     Chest wall: No tenderness.  Abdominal:     General: Bowel sounds are normal.     Palpations: Abdomen is soft.  Musculoskeletal:        General: Normal range of motion.     Cervical back: Normal range of motion and neck supple.  Lymphadenopathy:     Cervical: No cervical adenopathy.  Skin:    General: Skin is warm and  dry.  Neurological:     General: No focal deficit present.     Mental Status: She is alert and oriented to person, place, and time.     Cranial Nerves: No cranial nerve deficit.     Motor: No weakness.     Gait: Gait normal.  Psychiatric:        Behavior: Behavior normal.        Thought Content: Thought content normal.        Judgment: Judgment normal.        Assessment/Plan: 1. OSA (obstructive sleep apnea) Will move forward with APAP - For home use only DME continuous positive airway pressure (CPAP)  2. Essential hypertension Well controlled, continue current medication  3. Chronic obstructive pulmonary disease, unspecified COPD type ([place]) Continue current inhalers   General Counseling: Ketty verbalizes understanding of the findings of todays visit and agrees with plan of treatment. I have discussed any further diagnostic evaluation that may be needed or ordered today. We also reviewed her medications today. she has been encouraged to call the office with any questions or concerns that should arise related to todays visit.    Orders Placed This Encounter  Procedures   For home use only DME continuous positive airway pressure (CPAP)    No orders of the defined types were placed in this encounter.   This patient was seen by Drema Dallas, PA-C in collaboration with Dr. Clayborn Bigness as a part of collaborative care agreement.   Total time spent:30 Minutes Time spent includes review of chart, medications, test results, and follow up plan with the patient.       Dr Lavera Guise Internal medicine

## 2022-08-13 ENCOUNTER — Other Ambulatory Visit: Payer: Self-pay

## 2022-08-16 ENCOUNTER — Other Ambulatory Visit: Payer: Self-pay

## 2022-08-18 ENCOUNTER — Other Ambulatory Visit: Payer: Self-pay

## 2022-08-19 ENCOUNTER — Telehealth: Payer: Self-pay | Admitting: Physician Assistant

## 2022-08-23 ENCOUNTER — Other Ambulatory Visit: Payer: Self-pay

## 2022-08-26 ENCOUNTER — Other Ambulatory Visit: Payer: Self-pay

## 2022-08-27 ENCOUNTER — Other Ambulatory Visit: Payer: Self-pay

## 2022-08-31 ENCOUNTER — Telehealth: Payer: Self-pay | Admitting: Physician Assistant

## 2022-08-31 NOTE — Telephone Encounter (Signed)
Per Beth with [facility], they do not accept patient's insurance-Toni

## 2022-09-01 ENCOUNTER — Telehealth: Payer: Self-pay | Admitting: Physician Assistant

## 2022-09-01 NOTE — Telephone Encounter (Signed)
Notified Ashly w/ [employer] of cpap order. Also, hand delivered to Ashly-Toni

## 2022-09-03 ENCOUNTER — Other Ambulatory Visit: Payer: Self-pay

## 2022-09-06 ENCOUNTER — Other Ambulatory Visit: Payer: Self-pay

## 2022-09-10 ENCOUNTER — Telehealth: Payer: Self-pay | Admitting: Physician Assistant

## 2022-09-10 NOTE — Telephone Encounter (Signed)
Patient called requesting handicap sticker extension. Explained to her, she will need to bring in form from [employer]-Toni

## 2022-09-13 ENCOUNTER — Other Ambulatory Visit: Payer: Self-pay

## 2022-09-15 ENCOUNTER — Telehealth: Payer: Self-pay | Admitting: Physician Assistant

## 2022-09-15 NOTE — Telephone Encounter (Signed)
Patient brought in handicap form. Gave to Lauren to complete-Toni

## 2022-09-23 ENCOUNTER — Telehealth: Payer: Self-pay | Admitting: Physician Assistant

## 2022-09-23 NOTE — Telephone Encounter (Signed)
Notified patient disability parking form completed. She will p/u @ front desk-Toni

## 2022-09-24 ENCOUNTER — Telehealth: Payer: Self-pay | Admitting: Physician Assistant

## 2022-09-24 NOTE — Telephone Encounter (Signed)
Notified [employer] of Cpap and supply order-Toni

## 2022-10-01 ENCOUNTER — Telehealth: Payer: Self-pay | Admitting: Physician Assistant

## 2022-10-01 NOTE — Telephone Encounter (Signed)
Received [place] [employer] Request form 09/30/22 for lauren signature. [place] [employer] Request form signed. Faxed back; [phone]. To be scanned-nm

## 2022-10-05 NOTE — Telephone Encounter (Signed)
Error

## 2022-10-12 ENCOUNTER — Other Ambulatory Visit: Payer: Self-pay

## 2022-10-21 ENCOUNTER — Telehealth: Payer: Self-pay | Admitting: Physician Assistant

## 2022-10-21 NOTE — Telephone Encounter (Signed)
Per Elige Radon with [employer], patient will call them for set up when she gets new insurance-Toni

## 2022-11-10 ENCOUNTER — Other Ambulatory Visit: Payer: Self-pay

## 2022-11-11 ENCOUNTER — Other Ambulatory Visit: Payer: Self-pay

## 2022-11-15 ENCOUNTER — Telehealth: Payer: Self-pay | Admitting: Physician Assistant

## 2022-11-15 NOTE — Telephone Encounter (Signed)
Received [facility] order for CPAP and BIPAP. Gave to Lauren for signature-nm

## 2022-11-16 ENCOUNTER — Telehealth: Payer: Self-pay | Admitting: Physician Assistant

## 2022-11-16 NOTE — Telephone Encounter (Signed)
[facility] cpap/bipap order signed. Faxed back; [phone]. To be scanned

## 2022-11-18 ENCOUNTER — Other Ambulatory Visit: Payer: Self-pay | Admitting: Physician Assistant

## 2022-11-18 DIAGNOSIS — R928 Other abnormal and inconclusive findings on diagnostic imaging of breast: Secondary | ICD-10-CM

## 2022-11-18 DIAGNOSIS — R921 Mammographic calcification found on diagnostic imaging of breast: Secondary | ICD-10-CM

## 2022-11-18 DIAGNOSIS — N6489 Other specified disorders of breast: Secondary | ICD-10-CM

## 2022-11-24 ENCOUNTER — Other Ambulatory Visit: Payer: Self-pay

## 2022-12-03 ENCOUNTER — Other Ambulatory Visit: Payer: Self-pay

## 2022-12-13 ENCOUNTER — Other Ambulatory Visit: Payer: Self-pay

## 2022-12-13 ENCOUNTER — Encounter: Payer: Self-pay | Admitting: Physician Assistant

## 2022-12-13 ENCOUNTER — Encounter: Payer: Self-pay | Admitting: Pharmacist

## 2022-12-13 ENCOUNTER — Ambulatory Visit (INDEPENDENT_AMBULATORY_CARE_PROVIDER_SITE_OTHER): Payer: 59 | Admitting: Physician Assistant

## 2022-12-13 VITALS — BP 110/80 | HR 75 | Temp 98.4°F | Resp 16 | Ht 66.5 in | Wt 181.4 lb

## 2022-12-13 DIAGNOSIS — R946 Abnormal results of thyroid function studies: Secondary | ICD-10-CM

## 2022-12-13 DIAGNOSIS — R5383 Other fatigue: Secondary | ICD-10-CM | POA: Diagnosis not present

## 2022-12-13 DIAGNOSIS — E559 Vitamin D deficiency, unspecified: Secondary | ICD-10-CM

## 2022-12-13 DIAGNOSIS — I10 Essential (primary) hypertension: Secondary | ICD-10-CM | POA: Diagnosis not present

## 2022-12-13 DIAGNOSIS — E782 Mixed hyperlipidemia: Secondary | ICD-10-CM | POA: Diagnosis not present

## 2022-12-13 DIAGNOSIS — E538 Deficiency of other specified B group vitamins: Secondary | ICD-10-CM | POA: Diagnosis not present

## 2022-12-13 DIAGNOSIS — R42 Dizziness and giddiness: Secondary | ICD-10-CM

## 2022-12-13 MED ORDER — ZOSTER VAC RECOMB ADJUVANTED 50 MCG/0.5ML IM SUSR
0.5000 mL | Freq: Once | INTRAMUSCULAR | 0 refills | Status: AC
  Filled 2022-12-13: qty 0.5, 1d supply, fill #0

## 2022-12-13 MED ORDER — AMLODIPINE BESYLATE 2.5 MG PO TABS
2.5000 mg | ORAL_TABLET | Freq: Every day | ORAL | 1 refills | Status: DC
  Filled 2022-12-13 – 2023-02-21 (×2): qty 90, 90d supply, fill #0
  Filled 2023-06-22: qty 90, 90d supply, fill #1

## 2022-12-13 MED ORDER — TETANUS-DIPHTH-ACELL PERTUSSIS 5-2.5-18.5 LF-MCG/0.5 IM SUSY
0.5000 mL | PREFILLED_SYRINGE | Freq: Once | INTRAMUSCULAR | 0 refills | Status: AC
  Filled 2022-12-13: qty 0.5, 1d supply, fill #0

## 2022-12-13 NOTE — Progress Notes (Signed)
[facility] [address] [place], [place] [address]  Internal MEDICINE  Office Visit Note  Patient Name: Briana Snyder  [id number]  [id number]  Date of Service: 12/13/2022  Chief Complaint  Patient presents with   Gastroesophageal Reflux   Hypertension   Follow-up    Light headed, labs    HPI Pt is here for routine follow up -getting lightheaded a few times--states she has been taking old script for fioricet with codeine and states it helped? Discussed this medication is for migraines and could make dizziness worse. She denies having any associated headaches therefore discussed not taking this -This dizziness happened when she got off work and was walking to her car in the heat. Thinks it could have been heat. Feels better once AC going. When she gets home and continues to sit for a few mins she feels fine -Still has not gotten CPAP, issue with insurance, and states it is backordered -will start checking BP and bringing water with her/hydrating better as dizziness appears to only occur when in the heat after work. Advised on eating regularly as well. If BP dropping then may hold amlodipine -will see GYN for abnormal pap/colpo follow up -needs to reschedule missed CPE, will order labs  Current Medication: Outpatient Encounter Medications as of 12/13/2022  Medication Sig   albuterol (PROVENTIL) (2.5 MG/3ML) 0.083% nebulizer solution Take 3 mLs (2.5 mg total) by nebulization every 6 (six) hours as needed for wheezing or shortness of breath.   albuterol (VENTOLIN HFA) 108 (90 Base) MCG/ACT inhaler INHALE 1 PUFF INTO THE LUNGS EVERY 4 HOURS AS NEEDED   busPIRone (BUSPAR) 10 MG tablet Take 1 tablet (10 mg total) by mouth 2 (two) times daily.   cyanocobalamin (,VITAMIN B-12,) 1000 MCG/ML injection Inject once a week for 3 weeks then once a month   fluticasone (FLONASE) 50 MCG/ACT nasal spray Place 1 spray into both nostrils as needed.   Fluticasone-Umeclidin-Vilant (TRELEGY  ELLIPTA) 100-62.5-25 MCG/ACT AEPB Inhale 1 puff into the lungs daily.   hydrocortisone 1 % ointment Apply 1 application topically 2 (two) times daily.   meloxicam (MOBIC) 7.5 MG tablet TAKE 1 TABLET(7.5 MG) BY MOUTH DAILY   triamcinolone ointment (KENALOG) 0.5 % Apply 1 application topically 2 (two) times daily.   [DISCONTINUED] amLODipine (NORVASC) 2.5 MG tablet Take 1 tablet (2.5 mg total) by mouth daily.   [DISCONTINUED] Tdap (BOOSTRIX) 5-2.5-18.5 LF-MCG/0.5 injection Inject 0.5 mLs into the muscle once.   [DISCONTINUED] Zoster Vaccine Adjuvanted [facility]) injection Inject 0.5 mLs into the muscle once.   amLODipine (NORVASC) 2.5 MG tablet Take 1 tablet (2.5 mg total) by mouth daily.   Tdap (BOOSTRIX) 5-2.5-18.5 LF-MCG/0.5 injection Inject 0.5 mLs into the muscle once for 1 dose.   Zoster Vaccine Adjuvanted [facility]) injection Inject 0.5 mLs into the muscle once for 1 dose.   No facility-administered encounter medications on file as of 12/13/2022.    Surgical History: Past Surgical History:  Procedure Laterality Date   BREAST BIOPSY Left 2012   FIBROADENOMA, 1.0 CM.    BREAST LUMPECTOMY Left 2012   COLONOSCOPY N/A 01/28/2015   Procedure: COLONOSCOPY;  Surgeon: Kieth Brightly, MD;  Location: [facility] ENDOSCOPY;  Service: Endoscopy;  Laterality: N/A;   endometrial polyps  08/2010   resected   HERNIA REPAIR  2000   inguinal    LAPAROSCOPIC RIGHT HEMI COLECTOMY Right 02/25/2015   Procedure: LAPAROSCOPIC RIGHT HEMI COLECTOMY;  Surgeon: Kieth Brightly, MD;  Location: [facility] ORS;  Service: General;  Laterality:  Right;   THYROIDECTOMY  1987   Right   VARICOSE VEIN SURGERY      Medical History: Past Medical History:  Diagnosis Date   Anemia    Anxiety    Asthma    B12 deficiency    Benign neoplasm of cecum    Cancer (HCC)    T2,    GERD (gastroesophageal reflux disease)    Headache    Hypertension    Leukopenia     Family History: Family History  Problem Relation Age  of Onset   Hypertension Mother    Breast cancer Mother 59   Prostate cancer Father     Social History   Socioeconomic History   Marital status: Divorced    Spouse name: Not on file   Number of children: Not on file   Years of education: Not on file   Highest education level: Not on file  Occupational History   Not on file  Tobacco Use   Smoking status: Former    Current packs/day: 0.00    Average packs/day: 0.5 packs/day for 18.0 years (9.0 ttl pk-yrs)    Types: Cigarettes    Start date: 05/24/1993    Quit date: 05/25/2011    Years since quitting: 11.5    Passive exposure: Never   Smokeless tobacco: Never  Vaping Use   Vaping status: Never Used  Substance and Sexual Activity   Alcohol use: Yes    Comment: 1-2 cans of  beer almost every day   Drug use: No   Sexual activity: Not on file  Other Topics Concern   Not on file  Social History Narrative   Not on file   Social Determinants of Health   Financial Resource Strain: Not on file  Food Insecurity: Not on file  Transportation Needs: Not on file  Physical Activity: Not on file  Stress: Not on file  Social Connections: Not on file  Intimate Partner Violence: Not on file      Review of Systems  Constitutional:  Negative for chills, fatigue and unexpected weight change.  HENT:  Negative for congestion, postnasal drip, rhinorrhea, sneezing and sore throat.   Eyes:  Negative for redness.  Respiratory:  Negative for cough, chest tightness and shortness of breath.   Cardiovascular:  Negative for chest pain and palpitations.  Gastrointestinal:  Negative for abdominal pain, constipation, diarrhea, nausea and vomiting.  Genitourinary:  Negative for dysuria and frequency.  Musculoskeletal:  Positive for arthralgias. Negative for back pain, joint swelling and neck pain.  Skin:  Negative for rash.  Neurological:  Positive for light-headedness. Negative for tremors, numbness and headaches.  Hematological:  Negative for  adenopathy. Does not bruise/bleed easily.  Psychiatric/Behavioral:  Negative for behavioral problems (Depression), sleep disturbance and suicidal ideas. The patient is not nervous/anxious.     Vital Signs: BP 110/80   Pulse 75   Temp 98.4 F (36.9 C)   Resp 16   Ht 5' 6.5" (1.689 m)   Wt 181 lb 6.4 oz (82.3 kg)   LMP 02/08/2015   SpO2 96%   BMI 28.84 kg/m    Physical Exam Vitals and nursing note reviewed.  Constitutional:      General: She is not in acute distress.    Appearance: She is well-developed. She is not diaphoretic.  HENT:     Head: Normocephalic and atraumatic.     Mouth/Throat:     Pharynx: No oropharyngeal exudate.  Eyes:     Extraocular Movements: Extraocular movements  intact.     Pupils: Pupils are equal, round, and reactive to light.  Neck:     Thyroid: No thyromegaly.     Vascular: No JVD.     Trachea: No tracheal deviation.  Cardiovascular:     Rate and Rhythm: Normal rate and regular rhythm.     Heart sounds: Normal heart sounds. No murmur heard.    No friction rub. No gallop.  Pulmonary:     Effort: Pulmonary effort is normal. No respiratory distress.     Breath sounds: No wheezing or rales.  Chest:     Chest wall: No tenderness.  Abdominal:     General: Bowel sounds are normal.     Palpations: Abdomen is soft.  Musculoskeletal:        General: Normal range of motion.     Cervical back: Normal range of motion and neck supple.  Lymphadenopathy:     Cervical: No cervical adenopathy.  Skin:    General: Skin is warm and dry.  Neurological:     General: No focal deficit present.     Mental Status: She is alert and oriented to person, place, and time.     Cranial Nerves: No cranial nerve deficit.     Motor: No weakness.     Gait: Gait normal.  Psychiatric:        Behavior: Behavior normal.        Thought Content: Thought content normal.        Judgment: Judgment normal.        Assessment/Plan: 1. Episodic lightheadedness Appears to  correlate with heat after work and subsides once in [facility]. Advised to eat regularly and drink plenty of fluids. Will also monitor BP and hold amlodipine if BP running low. Will monitor symptoms and change position slowly. Will also check labs  2. Essential hypertension - amLODipine (NORVASC) 2.5 MG tablet; Take 1 tablet (2.5 mg total) by mouth daily.  Dispense: 90 tablet; Refill: 1  3. B12 deficiency - B12 and Folate Panel  4. Vitamin D deficiency - VITAMIN D 25 Hydroxy (Vit-D Deficiency, Fractures)  5. Mixed hyperlipidemia - Lipid Panel With LDL/HDL Ratio  6. Abnormal thyroid exam - TSH + free T4  7. Other fatigue - CBC w/Diff/Platelet - Comprehensive metabolic panel - TSH + free T4 - B12 and Folate Panel - Lipid Panel With LDL/HDL Ratio - VITAMIN D 25 Hydroxy (Vit-D Deficiency, Fractures)   General Counseling: Banessa verbalizes understanding of the findings of todays visit and agrees with plan of treatment. I have discussed any further diagnostic evaluation that may be needed or ordered today. We also reviewed her medications today. she has been encouraged to call the office with any questions or concerns that should arise related to todays visit.    Orders Placed This Encounter  Procedures   CBC w/Diff/Platelet   Comprehensive metabolic panel   TSH + free T4   B12 and Folate Panel   Lipid Panel With LDL/HDL Ratio   VITAMIN D 25 Hydroxy (Vit-D Deficiency, Fractures)    Meds ordered this encounter  Medications   amLODipine (NORVASC) 2.5 MG tablet    Sig: Take 1 tablet (2.5 mg total) by mouth daily.    Dispense:  90 tablet    Refill:  1    DX Code Needed  .   Tdap (BOOSTRIX) 5-2.5-18.5 LF-MCG/0.5 injection    Sig: Inject 0.5 mLs into the muscle once for 1 dose.    Dispense:  0.5 mL  Refill:  0   Zoster Vaccine Adjuvanted [facility]) injection    Sig: Inject 0.5 mLs into the muscle once for 1 dose.    Dispense:  0.5 mL    Refill:  0    This patient was seen by  Lynn Ito, PA-C in collaboration with Dr. Beverely Risen as a part of collaborative care agreement.   Total time spent:30 Minutes Time spent includes review of chart, medications, test results, and follow up plan with the patient.      Dr Lyndon Code Internal medicine

## 2022-12-27 DIAGNOSIS — G4733 Obstructive sleep apnea (adult) (pediatric): Secondary | ICD-10-CM | POA: Diagnosis not present

## 2022-12-29 ENCOUNTER — Other Ambulatory Visit
Admission: RE | Admit: 2022-12-29 | Discharge: 2022-12-29 | Disposition: A | Discharge status: Home / Self Care | Payer: 59 | Attending: Physician Assistant | Admitting: Physician Assistant

## 2022-12-29 DIAGNOSIS — E559 Vitamin D deficiency, unspecified: Secondary | ICD-10-CM | POA: Insufficient documentation

## 2022-12-29 DIAGNOSIS — E538 Deficiency of other specified B group vitamins: Secondary | ICD-10-CM | POA: Insufficient documentation

## 2022-12-29 DIAGNOSIS — R5383 Other fatigue: Secondary | ICD-10-CM | POA: Insufficient documentation

## 2022-12-29 DIAGNOSIS — R946 Abnormal results of thyroid function studies: Secondary | ICD-10-CM | POA: Insufficient documentation

## 2022-12-29 DIAGNOSIS — E782 Mixed hyperlipidemia: Secondary | ICD-10-CM | POA: Diagnosis not present

## 2022-12-29 LAB — CBC WITH DIFFERENTIAL/PLATELET
Abs Immature Granulocytes: 0.01 10*3/uL (ref 0.00–0.07)
Basophils Absolute: 0 10*3/uL (ref 0.0–0.1)
Basophils Relative: 1 %
Eosinophils Absolute: 0.4 10*3/uL (ref 0.0–0.5)
Eosinophils Relative: 12 %
HCT: 38.3 % (ref 36.0–46.0)
Hemoglobin: 12.5 g/dL (ref 12.0–15.0)
Immature Granulocytes: 0 %
Lymphocytes Relative: 39 %
Lymphs Abs: 1.2 10*3/uL (ref 0.7–4.0)
MCH: 29.8 pg (ref 26.0–34.0)
MCHC: 32.6 g/dL (ref 30.0–36.0)
MCV: 91.4 fL (ref 80.0–100.0)
Monocytes Absolute: 0.2 10*3/uL (ref 0.1–1.0)
Monocytes Relative: 7 %
Neutro Abs: 1.2 10*3/uL — ABNORMAL LOW (ref 1.7–7.7)
Neutrophils Relative %: 41 %
Platelets: 191 10*3/uL (ref 150–400)
RBC: 4.19 MIL/uL (ref 3.87–5.11)
RDW: 14.4 % (ref 11.5–15.5)
WBC: 3 10*3/uL — ABNORMAL LOW (ref 4.0–10.5)
nRBC: 0 % (ref 0.0–0.2)

## 2022-12-29 LAB — LIPID PANEL
Cholesterol: 193 mg/dL (ref 0–200)
HDL: 47 mg/dL (ref >40)
LDL Cholesterol: UNDETERMINED mg/dL (ref 0–99)
Total CHOL/HDL Ratio: 4.1 RATIO
Triglycerides: 490 mg/dL — ABNORMAL HIGH (ref <150)
VLDL: UNDETERMINED mg/dL (ref 0–40)

## 2022-12-29 LAB — COMPREHENSIVE METABOLIC PANEL
ALT: 14 U/L (ref 0–44)
AST: 17 U/L (ref 15–41)
Albumin: 3.9 g/dL (ref 3.5–5.0)
Alkaline Phosphatase: 68 U/L (ref 38–126)
Anion gap: 8 (ref 5–15)
BUN: 12 mg/dL (ref 6–20)
CO2: 24 mmol/L (ref 22–32)
Calcium: 8.4 mg/dL — ABNORMAL LOW (ref 8.9–10.3)
Chloride: 107 mmol/L (ref 98–111)
Creatinine, Ser: 0.78 mg/dL (ref 0.44–1.00)
GFR, Estimated: >60 mL/min (ref >60)
Glucose, Bld: 94 mg/dL (ref 70–99)
Potassium: 3.8 mmol/L (ref 3.5–5.1)
Sodium: 139 mmol/L (ref 135–145)
Total Bilirubin: 0.3 mg/dL (ref 0.3–1.2)
Total Protein: 7.3 g/dL (ref 6.5–8.1)

## 2022-12-29 LAB — LDL CHOLESTEROL, DIRECT: Direct LDL: 74 mg/dL (ref 0–99)

## 2022-12-29 LAB — FOLATE: Folate: 6.5 ng/mL (ref >5.9)

## 2022-12-29 LAB — VITAMIN D 25 HYDROXY (VIT D DEFICIENCY, FRACTURES): Vit D, 25-Hydroxy: 19.93 ng/mL — ABNORMAL LOW (ref 30–100)

## 2022-12-29 LAB — TSH: TSH: 1.755 u[IU]/mL (ref 0.350–4.500)

## 2022-12-29 LAB — T4, FREE: Free T4: 0.73 ng/dL (ref 0.61–1.12)

## 2022-12-29 LAB — VITAMIN B12: Vitamin B-12: 353 pg/mL (ref 180–914)

## 2022-12-31 ENCOUNTER — Telehealth: Payer: Self-pay

## 2022-12-31 NOTE — Telephone Encounter (Signed)
Left message for patient to give office a call back.  

## 2023-01-04 ENCOUNTER — Other Ambulatory Visit: Payer: Self-pay

## 2023-01-04 ENCOUNTER — Telehealth: Payer: Self-pay

## 2023-01-04 MED ORDER — ERGOCALCIFEROL 1.25 MG (50000 UT) PO CAPS
50000.0000 [IU] | ORAL_CAPSULE | ORAL | 5 refills | Status: DC
  Filled 2023-01-04: qty 4, 28d supply, fill #0
  Filled 2023-02-03 (×2): qty 4, 28d supply, fill #1
  Filled 2023-02-21 – 2023-02-25 (×2): qty 4, 28d supply, fill #2
  Filled 2023-03-28: qty 4, 28d supply, fill #3
  Filled 2023-05-17 – 2023-09-22 (×2): qty 4, 28d supply, fill #4
  Filled 2023-12-15: qty 4, 28d supply, fill #5

## 2023-01-04 MED ORDER — ROSUVASTATIN CALCIUM 5 MG PO TABS
5.0000 mg | ORAL_TABLET | ORAL | 3 refills | Status: DC
  Filled 2023-01-04: qty 24, 84d supply, fill #0
  Filled 2023-03-28: qty 24, 84d supply, fill #1
  Filled 2023-06-22: qty 24, 84d supply, fill #2
  Filled 2023-09-22: qty 24, 84d supply, fill #3

## 2023-01-04 NOTE — Telephone Encounter (Signed)
Pt.notified

## 2023-01-04 NOTE — Telephone Encounter (Signed)
-----   Message from Carlean Jews sent at 12/31/2022  1:40 PM EDT ----- Please let her know her Vit D is low--send drisdol. Her triglyceride is elevated and needs to work on diet/exercise. Could consider crestor 2x per week. Calcium low--supplement OTC. WBC continues to be low and follow up with hematology as before. B12 a little low and should supplement

## 2023-01-05 ENCOUNTER — Other Ambulatory Visit: Payer: Self-pay

## 2023-01-07 ENCOUNTER — Other Ambulatory Visit: Payer: Self-pay

## 2023-01-12 ENCOUNTER — Other Ambulatory Visit: Payer: Self-pay

## 2023-01-12 ENCOUNTER — Other Ambulatory Visit: Payer: Self-pay | Admitting: Physician Assistant

## 2023-01-12 DIAGNOSIS — M545 Low back pain, unspecified: Secondary | ICD-10-CM

## 2023-01-12 MED ORDER — MELOXICAM 7.5 MG PO TABS
7.5000 mg | ORAL_TABLET | Freq: Every day | ORAL | 1 refills | Status: DC
  Filled 2023-01-12: qty 90, 90d supply, fill #0
  Filled 2023-04-15: qty 90, 90d supply, fill #1

## 2023-01-27 DIAGNOSIS — G4733 Obstructive sleep apnea (adult) (pediatric): Secondary | ICD-10-CM | POA: Diagnosis not present

## 2023-02-03 ENCOUNTER — Other Ambulatory Visit: Payer: Self-pay

## 2023-02-14 ENCOUNTER — Ambulatory Visit
Admission: RE | Admit: 2023-02-14 | Discharge: 2023-02-14 | Disposition: A | Discharge status: Home / Self Care | Payer: 59 | Source: Ambulatory Visit | Attending: Physician Assistant | Admitting: Physician Assistant

## 2023-02-14 DIAGNOSIS — R921 Mammographic calcification found on diagnostic imaging of breast: Secondary | ICD-10-CM | POA: Insufficient documentation

## 2023-02-14 DIAGNOSIS — R92322 Mammographic fibroglandular density, left breast: Secondary | ICD-10-CM | POA: Diagnosis not present

## 2023-02-14 DIAGNOSIS — R928 Other abnormal and inconclusive findings on diagnostic imaging of breast: Secondary | ICD-10-CM | POA: Insufficient documentation

## 2023-02-14 DIAGNOSIS — N6489 Other specified disorders of breast: Secondary | ICD-10-CM

## 2023-02-21 ENCOUNTER — Other Ambulatory Visit: Payer: Self-pay

## 2023-02-25 ENCOUNTER — Other Ambulatory Visit: Payer: Self-pay

## 2023-02-26 DIAGNOSIS — G4733 Obstructive sleep apnea (adult) (pediatric): Secondary | ICD-10-CM | POA: Diagnosis not present

## 2023-02-28 ENCOUNTER — Other Ambulatory Visit: Payer: Self-pay

## 2023-03-02 ENCOUNTER — Other Ambulatory Visit: Payer: Self-pay

## 2023-03-07 ENCOUNTER — Encounter: Payer: Self-pay | Admitting: Physician Assistant

## 2023-03-07 ENCOUNTER — Other Ambulatory Visit: Payer: Self-pay

## 2023-03-07 ENCOUNTER — Ambulatory Visit (INDEPENDENT_AMBULATORY_CARE_PROVIDER_SITE_OTHER): Payer: 59 | Admitting: Physician Assistant

## 2023-03-07 VITALS — BP 110/80 | HR 82 | Temp 97.8°F | Resp 16 | Ht 66.0 in | Wt 178.0 lb

## 2023-03-07 DIAGNOSIS — R87618 Other abnormal cytological findings on specimens from cervix uteri: Secondary | ICD-10-CM

## 2023-03-07 DIAGNOSIS — Z0001 Encounter for general adult medical examination with abnormal findings: Secondary | ICD-10-CM | POA: Diagnosis not present

## 2023-03-07 DIAGNOSIS — Z23 Encounter for immunization: Secondary | ICD-10-CM

## 2023-03-07 DIAGNOSIS — L209 Atopic dermatitis, unspecified: Secondary | ICD-10-CM

## 2023-03-07 MED ORDER — SHINGRIX 50 MCG/0.5ML IM SUSR
0.5000 mL | Freq: Once | INTRAMUSCULAR | 0 refills | Status: AC
  Filled 2023-03-07: qty 0.5, 1d supply, fill #0

## 2023-03-07 MED ORDER — TETANUS-DIPHTH-ACELL PERTUSSIS 5-2-15.5 LF-MCG/0.5 IM SUSP
0.5000 mL | Freq: Once | INTRAMUSCULAR | 0 refills | Status: AC
  Filled 2023-03-07: qty 0.5, 1d supply, fill #0

## 2023-03-07 MED ORDER — TRIAMCINOLONE ACETONIDE 0.5 % EX OINT
1.0000 | TOPICAL_OINTMENT | Freq: Two times a day (BID) | CUTANEOUS | 3 refills | Status: AC
Start: 2023-03-07 — End: ?
  Filled 2023-03-07: qty 90, 45d supply, fill #0
  Filled 2023-04-15: qty 90, 45d supply, fill #1

## 2023-03-07 NOTE — Progress Notes (Signed)
[facility] [address] [place], [place] [address]  Internal MEDICINE  Office Visit Note  Patient Name: Briana Snyder  [id number]  [id number]  Date of Service: 03/07/2023  Chief Complaint  Patient presents with   Annual Exam     HPI Pt is here for routine health maintenance examination -Mammogram done in sept as a 6 mo follow up, due for diagnostic in Dec -Will follow up with GI for follow up given prev cancer dx in 2016 -will plan for shingles vaccine, tdap -last pap abnormal and sent to GYN and had colposcopy in Aug 2023 and recommended to have LEEP but did not have this done. She is going to follow up on this now. -labs previously reviewed over the phone, doing well on crestor and will continue  Current Medication: Outpatient Encounter Medications as of 03/07/2023  Medication Sig   albuterol (PROVENTIL) (2.5 MG/3ML) 0.083% nebulizer solution Take 3 mLs (2.5 mg total) by nebulization every 6 (six) hours as needed for wheezing or shortness of breath.   albuterol (VENTOLIN HFA) 108 (90 Base) MCG/ACT inhaler INHALE 1 PUFF INTO THE LUNGS EVERY 4 HOURS AS NEEDED   amLODipine (NORVASC) 2.5 MG tablet Take 1 tablet (2.5 mg total) by mouth daily.   busPIRone (BUSPAR) 10 MG tablet Take 1 tablet (10 mg total) by mouth 2 (two) times daily.   cyanocobalamin (,VITAMIN B-12,) 1000 MCG/ML injection Inject once a week for 3 weeks then once a month   ergocalciferol (VITAMIN D2) 1.25 MG (50000 UT) capsule Take 1 capsule (50,000 Units total) by mouth once a week.   fluticasone (FLONASE) 50 MCG/ACT nasal spray Place 1 spray into both nostrils as needed.   Fluticasone-Umeclidin-Vilant (TRELEGY ELLIPTA) 100-62.5-25 MCG/ACT AEPB Inhale 1 puff into the lungs daily.   hydrocortisone 1 % ointment Apply 1 application topically 2 (two) times daily.   meloxicam (MOBIC) 7.5 MG tablet Take 1 tablet (7.5 mg total) by mouth daily.   rosuvastatin (CRESTOR) 5 MG tablet Take 1 tablet (5 mg  total) by mouth twice a week.   [DISCONTINUED] Tdap (ADACEL) 09-22-13.5 LF-MCG/0.5 injection Inject 0.5 mLs into the muscle once.   [DISCONTINUED] triamcinolone ointment (KENALOG) 0.5 % Apply 1 application topically 2 (two) times daily.   [DISCONTINUED] Zoster Vaccine Adjuvanted [facility]) injection Inject 0.5 mLs into the muscle once.   Tdap (ADACEL) 09-22-13.5 LF-MCG/0.5 injection Inject 0.5 mLs into the muscle once for 1 dose.   triamcinolone ointment (KENALOG) 0.5 % Apply 1 Application topically 2 (two) times daily.   Zoster Vaccine Adjuvanted [facility]) injection Inject 0.5 mLs into the muscle once for 1 dose.   No facility-administered encounter medications on file as of 03/07/2023.    Surgical History: Past Surgical History:  Procedure Laterality Date   BREAST BIOPSY Left 2012   FIBROADENOMA, 1.0 CM.    BREAST LUMPECTOMY Left 2012   COLONOSCOPY N/A 01/28/2015   Procedure: COLONOSCOPY;  Surgeon: Kieth Brightly, MD;  Location: [facility] ENDOSCOPY;  Service: Endoscopy;  Laterality: N/A;   endometrial polyps  08/2010   resected   HERNIA REPAIR  2000   inguinal    LAPAROSCOPIC RIGHT HEMI COLECTOMY Right 02/25/2015   Procedure: LAPAROSCOPIC RIGHT HEMI COLECTOMY;  Surgeon: Kieth Brightly, MD;  Location: [facility] ORS;  Service: General;  Laterality: Right;   THYROIDECTOMY  1987   Right   VARICOSE VEIN SURGERY      Medical History: Past Medical History:  Diagnosis Date   Anemia    Anxiety  Asthma    B12 deficiency    Benign neoplasm of cecum    Cancer (HCC)    T2,    GERD (gastroesophageal reflux disease)    Headache    Hypertension    Leukopenia     Family History: Family History  Problem Relation Age of Onset   Hypertension Mother    Breast cancer Mother 63   Prostate cancer Father       Review of Systems  Constitutional:  Negative for chills, fatigue and unexpected weight change.  HENT:  Negative for congestion, postnasal drip, rhinorrhea, sneezing and sore  throat.   Eyes:  Negative for redness.  Respiratory:  Negative for cough, chest tightness and shortness of breath.   Cardiovascular:  Negative for chest pain and palpitations.  Gastrointestinal:  Negative for abdominal pain, constipation, diarrhea, nausea and vomiting.  Genitourinary:  Negative for dysuria and frequency.  Musculoskeletal:  Negative for back pain, joint swelling and neck pain.  Skin:  Negative for rash.  Neurological: Negative.  Negative for tremors and numbness.  Hematological:  Negative for adenopathy. Does not bruise/bleed easily.  Psychiatric/Behavioral:  Negative for behavioral problems (Depression), sleep disturbance and suicidal ideas. The patient is not nervous/anxious.      Vital Signs: BP 110/80   Pulse 82   Temp 97.8 F (36.6 C)   Resp 16   Ht 5\' 6"  (1.676 m)   Wt 178 lb (80.7 kg)   LMP 02/08/2015   SpO2 97%   BMI 28.73 kg/m    Physical Exam Vitals and nursing note reviewed.  Constitutional:      General: She is not in acute distress.    Appearance: She is well-developed. She is not diaphoretic.  HENT:     Head: Normocephalic and atraumatic.     Mouth/Throat:     Pharynx: No oropharyngeal exudate.  Eyes:     Extraocular Movements: Extraocular movements intact.     Pupils: Pupils are equal, round, and reactive to light.  Neck:     Thyroid: No thyromegaly.     Vascular: No JVD.     Trachea: No tracheal deviation.  Cardiovascular:     Rate and Rhythm: Normal rate and regular rhythm.     Heart sounds: Normal heart sounds. No murmur heard.    No friction rub. No gallop.  Pulmonary:     Effort: Pulmonary effort is normal. No respiratory distress.     Breath sounds: No wheezing or rales.  Chest:     Chest wall: No tenderness.  Breasts:    Right: Normal. No mass.     Left: Normal. No mass.  Abdominal:     General: Bowel sounds are normal.     Palpations: Abdomen is soft.     Tenderness: There is no abdominal tenderness.  Musculoskeletal:         General: Normal range of motion.     Cervical back: Normal range of motion and neck supple.  Lymphadenopathy:     Cervical: No cervical adenopathy.  Skin:    General: Skin is warm and dry.  Neurological:     General: No focal deficit present.     Mental Status: She is alert and oriented to person, place, and time.     Cranial Nerves: No cranial nerve deficit.     Motor: No weakness.     Gait: Gait normal.  Psychiatric:        Behavior: Behavior normal.        Thought  Content: Thought content normal.        Judgment: Judgment normal.      LABS: Recent Results (from the past 2160 hour(s))  CBC with Differential/Platelet     Status: Abnormal   Collection Time: 12/29/22  9:18 AM  Result Value Ref Range   WBC 3.0 (L) 4.0 - 10.5 K/uL   RBC 4.19 3.87 - 5.11 MIL/uL   Hemoglobin 12.5 12.0 - 15.0 g/dL   HCT 60.4 54.0 - 98.1 %   MCV 91.4 80.0 - 100.0 fL   MCH 29.8 26.0 - 34.0 pg   MCHC 32.6 30.0 - 36.0 g/dL   RDW 19.1 47.8 - 29.5 %   Platelets 191 150 - 400 K/uL   nRBC 0.0 0.0 - 0.2 %   Neutrophils Relative % 41 %   Neutro Abs 1.2 (L) 1.7 - 7.7 K/uL   Lymphocytes Relative 39 %   Lymphs Abs 1.2 0.7 - 4.0 K/uL   Monocytes Relative 7 %   Monocytes Absolute 0.2 0.1 - 1.0 K/uL   Eosinophils Relative 12 %   Eosinophils Absolute 0.4 0.0 - 0.5 K/uL   Basophils Relative 1 %   Basophils Absolute 0.0 0.0 - 0.1 K/uL   Immature Granulocytes 0 %   Abs Immature Granulocytes 0.01 0.00 - 0.07 K/uL    Comment: Performed at [facility], [address] [place], [place] [address]  Comprehensive metabolic panel     Status: Abnormal   Collection Time: 12/29/22  9:18 AM  Result Value Ref Range   Sodium 139 135 - 145 mmol/L   Potassium 3.8 3.5 - 5.1 mmol/L   Chloride 107 98 - 111 mmol/L   CO2 24 22 - 32 mmol/L   Glucose, Bld 94 70 - 99 mg/dL    Comment: Glucose reference range applies only to samples taken after fasting for at least 8 hours.   BUN 12 6 - 20 mg/dL    Creatinine, Ser 8.65 0.44 - 1.00 mg/dL   Calcium 8.4 (L) 8.9 - 10.3 mg/dL   Total Protein 7.3 6.5 - 8.1 g/dL   Albumin 3.9 3.5 - 5.0 g/dL   AST 17 15 - 41 U/L   ALT 14 0 - 44 U/L   Alkaline Phosphatase 68 38 - 126 U/L   Total Bilirubin 0.3 0.3 - 1.2 mg/dL   GFR, Estimated >78 >46 mL/min    Comment: (NOTE) Calculated using the CKD-EPI Creatinine Equation (2021)    Anion gap 8 5 - 15    Comment: Performed at [facility], [address] [place], [place] [address]  TSH     Status: None   Collection Time: 12/29/22  9:18 AM  Result Value Ref Range   TSH 1.755 0.350 - 4.500 uIU/mL    Comment: Performed by a 3rd Generation assay with a functional sensitivity of <=0.01 uIU/mL. Performed at [facility], [address] [place], [place] [address]   T4, free     Status: None   Collection Time: 12/29/22  9:18 AM  Result Value Ref Range   Free T4 0.73 0.61 - 1.12 ng/dL    Comment: (NOTE) Biotin ingestion may interfere with free T4 tests. If the results are inconsistent with the TSH level, previous test results, or the clinical presentation, then consider biotin interference. If needed, order repeat testing after stopping biotin. Performed at [facility], [address], [place], [place] [address]   Vitamin B12     Status: None  Collection Time: 12/29/22  9:18 AM  Result Value Ref Range   Vitamin B-12 353 180 - 914 pg/mL    Comment: (NOTE) This assay is not validated for testing neonatal or myeloproliferative syndrome specimens for Vitamin B12 levels. Performed at [facility], [address] [place], [place] [address]   Folate     Status: None   Collection Time: 12/29/22  9:18 AM  Result Value Ref Range   Folate 6.5 >5.9 ng/mL    Comment: Performed at [facility], [address] [place], [place] [address]  Lipid panel     Status: Abnormal   Collection Time: 12/29/22  9:18 AM  Result Value Ref Range   Cholesterol 193 0 - 200  mg/dL   Triglycerides 865 (H) <150 mg/dL   HDL 47 >78 mg/dL   Total CHOL/HDL Ratio 4.1 RATIO   VLDL UNABLE TO CALCULATE IF TRIGLYCERIDE OVER 400 mg/dL 0 - 40 mg/dL   LDL Cholesterol UNABLE TO CALCULATE IF TRIGLYCERIDE OVER 400 mg/dL 0 - 99 mg/dL    Comment:        Total Cholesterol/HDL:CHD Risk Coronary Heart Disease Risk Table                     Men   Women  1/2 Average Risk   3.4   3.3  Average Risk       5.0   4.4  2 X Average Risk   9.6   7.1  3 X Average Risk  23.4   11.0        Use the calculated Patient Ratio above and the CHD Risk Table to determine the patient's CHD Risk.        ATP III CLASSIFICATION (LDL):  <100     mg/dL   Optimal  469-629  mg/dL   Near or Above                    Optimal  130-159  mg/dL   Borderline  528-413  mg/dL   High  >244     mg/dL   Very High Performed at [facility], [address] [place], [place] [address]   VITAMIN D 25 Hydroxy (Vit-D Deficiency, Fractures)     Status: Abnormal   Collection Time: 12/29/22  9:18 AM  Result Value Ref Range   Vit D, 25-Hydroxy 19.93 (L) 30 - 100 ng/mL    Comment: (NOTE) Vitamin D deficiency has been defined by the Institute of Medicine  and an Endocrine Society practice guideline as a level of serum 25-OH  vitamin D less than 20 ng/mL (1,2). The Endocrine Society went on to  further define vitamin D insufficiency as a level between 21 and 29  ng/mL (2).  1. IOM (Institute of Medicine). 2010. Dietary reference intakes for  calcium and D. [place]: [employer]. 2. Holick MF, Binkley Gratton, Bischoff-Ferrari HA, et al. Evaluation,  treatment, and prevention of vitamin D deficiency: an Endocrine  Society clinical practice guideline, JCEM. 2011 Jul; 96(7): 1911-30.  Performed at [facility], [address], [place], [place] [address]   LDL cholesterol, direct     Status: None   Collection Time: 12/29/22  9:18 AM  Result Value Ref Range   Direct LDL 74 0 - 99  mg/dL    Comment: Performed at [facility], [address], [place], [place] [address]        Assessment/Plan:  1. Encounter for general adult medical examination with abnormal findings CPE performed, labs previously reviewed, diagnostic mammogram due in Dec. Needs to follow up with GI/GS for colonoscopy given hx of cancer of cecum  2. Flu vaccine need - Influenza, MDCK, trivalent, PF(Flucelvax egg-free)  3. Atopic dermatitis, mild - triamcinolone ointment (KENALOG) 0.5 %; Apply 1 Application topically 2 (two) times daily.  Dispense: 90 g; Refill: 3  4. Abnormal Papanicolaou smear of cervix with positive human papilloma virus (HPV) test Will follow up with GYN for possible LEEP as previously discussed   General Counseling: Briana Snyder verbalizes understanding of the findings of todays visit and agrees with plan of treatment. I have discussed any further diagnostic evaluation that may be needed or ordered today. We also reviewed her medications today. she has been encouraged to call the office with any questions or concerns that should arise related to todays visit.    Counseling:    Orders Placed This Encounter  Procedures   Influenza, MDCK, trivalent, PF(Flucelvax egg-free)    Meds ordered this encounter  Medications   Zoster Vaccine Adjuvanted [facility]) injection    Sig: Inject 0.5 mLs into the muscle once for 1 dose.    Dispense:  0.5 mL    Refill:  0   Tdap (ADACEL) 09-22-13.5 LF-MCG/0.5 injection    Sig: Inject 0.5 mLs into the muscle once for 1 dose.    Dispense:  0.5 mL    Refill:  0   triamcinolone ointment (KENALOG) 0.5 %    Sig: Apply 1 Application topically 2 (two) times daily.    Dispense:  90 g    Refill:  3    This patient was seen by Lynn Ito, PA-C in collaboration with Dr. Beverely Risen as a part of collaborative care agreement.  Total time spent:35 Minutes  Time spent includes review of chart, medications, test results, and follow up plan  with the patient.     Lyndon Code, MD  Internal Medicine

## 2023-03-21 ENCOUNTER — Encounter: Payer: Self-pay | Admitting: Internal Medicine

## 2023-03-21 ENCOUNTER — Ambulatory Visit: Payer: 59 | Admitting: Internal Medicine

## 2023-03-21 VITALS — BP 110/80 | HR 106 | Temp 98.1°F | Resp 16 | Ht 66.0 in | Wt 179.4 lb

## 2023-03-21 DIAGNOSIS — J4489 Other specified chronic obstructive pulmonary disease: Secondary | ICD-10-CM | POA: Diagnosis not present

## 2023-03-21 DIAGNOSIS — G4733 Obstructive sleep apnea (adult) (pediatric): Secondary | ICD-10-CM

## 2023-03-21 NOTE — Patient Instructions (Signed)

## 2023-03-21 NOTE — Progress Notes (Signed)
[facility] [address] [place], [place] [address]  Pulmonary Sleep Medicine   Office Visit Note  Patient Name: Briana Snyder DOB: Sep 20, 1965 MRN [id number]  Date of Service: 03/21/2023  Complaints/HPI: She is doing well with the trelegy. She states she has been using coupons online for a discount. She denies side effects. She is not smoking at this time. She has no admissions to the hospital. She has no chest pain no falls.  Office Spirometry Results:     ROS  General: (-) fever, (-) chills, (-) night sweats, (-) weakness Skin: (-) rashes, (-) itching,. Eyes: (-) visual changes, (-) redness, (-) itching. Nose and Sinuses: (-) nasal stuffiness or itchiness, (-) postnasal drip, (-) nosebleeds, (-) sinus trouble. Mouth and Throat: (-) sore throat, (-) hoarseness. Neck: (-) swollen glands, (-) enlarged thyroid, (-) neck pain. Respiratory: - cough, (-) bloody sputum, - shortness of breath, - wheezing. Cardiovascular: - ankle swelling, (-) chest pain. Lymphatic: (-) lymph node enlargement. Neurologic: (-) numbness, (-) tingling. Psychiatric: (-) anxiety, (-) depression   Current Medication: Outpatient Encounter Medications as of 03/21/2023  Medication Sig   albuterol (PROVENTIL) (2.5 MG/3ML) 0.083% nebulizer solution Take 3 mLs (2.5 mg total) by nebulization every 6 (six) hours as needed for wheezing or shortness of breath.   albuterol (VENTOLIN HFA) 108 (90 Base) MCG/ACT inhaler INHALE 1 PUFF INTO THE LUNGS EVERY 4 HOURS AS NEEDED   amLODipine (NORVASC) 2.5 MG tablet Take 1 tablet (2.5 mg total) by mouth daily.   busPIRone (BUSPAR) 10 MG tablet Take 1 tablet (10 mg total) by mouth 2 (two) times daily.   cyanocobalamin (,VITAMIN B-12,) 1000 MCG/ML injection Inject once a week for 3 weeks then once a month   ergocalciferol (VITAMIN D2) 1.25 MG (50000 UT) capsule Take 1 capsule (50,000 Units total) by mouth once a week.   fluticasone (FLONASE) 50 MCG/ACT  nasal spray Place 1 spray into both nostrils as needed.   Fluticasone-Umeclidin-Vilant (TRELEGY ELLIPTA) 100-62.5-25 MCG/ACT AEPB Inhale 1 puff into the lungs daily.   hydrocortisone 1 % ointment Apply 1 application topically 2 (two) times daily.   meloxicam (MOBIC) 7.5 MG tablet Take 1 tablet (7.5 mg total) by mouth daily.   rosuvastatin (CRESTOR) 5 MG tablet Take 1 tablet (5 mg total) by mouth twice a week.   triamcinolone ointment (KENALOG) 0.5 % Apply 1 Application topically 2 (two) times daily.   No facility-administered encounter medications on file as of 03/21/2023.    Surgical History: Past Surgical History:  Procedure Laterality Date   BREAST BIOPSY Left 2012   FIBROADENOMA, 1.0 CM.    BREAST LUMPECTOMY Left 2012   COLONOSCOPY N/A 01/28/2015   Procedure: COLONOSCOPY;  Surgeon: Kieth Brightly, MD;  Location: [facility] ENDOSCOPY;  Service: Endoscopy;  Laterality: N/A;   endometrial polyps  08/2010   resected   HERNIA REPAIR  2000   inguinal    LAPAROSCOPIC RIGHT HEMI COLECTOMY Right 02/25/2015   Procedure: LAPAROSCOPIC RIGHT HEMI COLECTOMY;  Surgeon: Kieth Brightly, MD;  Location: [facility] ORS;  Service: General;  Laterality: Right;   THYROIDECTOMY  1987   Right   VARICOSE VEIN SURGERY      Medical History: Past Medical History:  Diagnosis Date   Anemia    Anxiety    Asthma    B12 deficiency    Benign neoplasm of cecum    Cancer (HCC)    T2,    GERD (gastroesophageal reflux disease)    Headache  Hypertension    Leukopenia     Family History: Family History  Problem Relation Age of Onset   Hypertension Mother    Breast cancer Mother 43   Prostate cancer Father     Social History: Social History   Socioeconomic History   Marital status: Divorced    Spouse name: Not on file   Number of children: Not on file   Years of education: Not on file   Highest education level: Not on file  Occupational History   Not on file  Tobacco Use   Smoking status:  Former    Current packs/day: 0.00    Average packs/day: 0.5 packs/day for 18.0 years (9.0 ttl pk-yrs)    Types: Cigarettes    Start date: 05/24/1993    Quit date: 05/25/2011    Years since quitting: 11.8    Passive exposure: Never   Smokeless tobacco: Never  Vaping Use   Vaping status: Never Used  Substance and Sexual Activity   Alcohol use: Yes    Comment: 1-2 cans of  beer almost every day   Drug use: No   Sexual activity: Not on file  Other Topics Concern   Not on file  Social History Narrative   Not on file   Social Determinants of Health   Financial Resource Strain: Not on file  Food Insecurity: Not on file  Transportation Needs: Not on file  Physical Activity: Not on file  Stress: Not on file  Social Connections: Not on file  Intimate Partner Violence: Not on file    Vital Signs: Blood pressure 110/80, pulse (!) 106, temperature 98.1 F (36.7 C), resp. rate 16, height 5\' 6"  (1.676 m), weight 179 lb 6.4 oz (81.4 kg), last menstrual period 02/08/2015, SpO2 98%.  Examination: General Appearance: The patient is well-developed, well-nourished, and in no distress. Skin: Gross inspection of skin unremarkable. Head: normocephalic, no gross deformities. Eyes: no gross deformities noted. ENT: ears appear grossly normal no exudates. Neck: Supple. No thyromegaly. No LAD. Respiratory: no rhonchi noted. Cardiovascular: Normal S1 and S2 without murmur or rub. Extremities: No cyanosis. pulses are equal. Neurologic: Alert and oriented. No involuntary movements.  LABS: Recent Results (from the past 2160 hour(s))  CBC with Differential/Platelet     Status: Abnormal   Collection Time: 12/29/22  9:18 AM  Result Value Ref Range   WBC 3.0 (L) 4.0 - 10.5 K/uL   RBC 4.19 3.87 - 5.11 MIL/uL   Hemoglobin 12.5 12.0 - 15.0 g/dL   HCT 40.9 81.1 - 91.4 %   MCV 91.4 80.0 - 100.0 fL   MCH 29.8 26.0 - 34.0 pg   MCHC 32.6 30.0 - 36.0 g/dL   RDW 78.2 95.6 - 21.3 %   Platelets 191 150 - 400  K/uL   nRBC 0.0 0.0 - 0.2 %   Neutrophils Relative % 41 %   Neutro Abs 1.2 (L) 1.7 - 7.7 K/uL   Lymphocytes Relative 39 %   Lymphs Abs 1.2 0.7 - 4.0 K/uL   Monocytes Relative 7 %   Monocytes Absolute 0.2 0.1 - 1.0 K/uL   Eosinophils Relative 12 %   Eosinophils Absolute 0.4 0.0 - 0.5 K/uL   Basophils Relative 1 %   Basophils Absolute 0.0 0.0 - 0.1 K/uL   Immature Granulocytes 0 %   Abs Immature Granulocytes 0.01 0.00 - 0.07 K/uL    Comment: Performed at [facility], [address] [place], [place] [address]  Comprehensive metabolic panel  Status: Abnormal   Collection Time: 12/29/22  9:18 AM  Result Value Ref Range   Sodium 139 135 - 145 mmol/L   Potassium 3.8 3.5 - 5.1 mmol/L   Chloride 107 98 - 111 mmol/L   CO2 24 22 - 32 mmol/L   Glucose, Bld 94 70 - 99 mg/dL    Comment: Glucose reference range applies only to samples taken after fasting for at least 8 hours.   BUN 12 6 - 20 mg/dL   Creatinine, Ser 8.65 0.44 - 1.00 mg/dL   Calcium 8.4 (L) 8.9 - 10.3 mg/dL   Total Protein 7.3 6.5 - 8.1 g/dL   Albumin 3.9 3.5 - 5.0 g/dL   AST 17 15 - 41 U/L   ALT 14 0 - 44 U/L   Alkaline Phosphatase 68 38 - 126 U/L   Total Bilirubin 0.3 0.3 - 1.2 mg/dL   GFR, Estimated >78 >46 mL/min    Comment: (NOTE) Calculated using the CKD-EPI Creatinine Equation (2021)    Anion gap 8 5 - 15    Comment: Performed at [facility], [address] [place], [place] [address]  TSH     Status: None   Collection Time: 12/29/22  9:18 AM  Result Value Ref Range   TSH 1.755 0.350 - 4.500 uIU/mL    Comment: Performed by a 3rd Generation assay with a functional sensitivity of <=0.01 uIU/mL. Performed at [facility], [address] [place], [place] [address]   T4, free     Status: None   Collection Time: 12/29/22  9:18 AM  Result Value Ref Range   Free T4 0.73 0.61 - 1.12 ng/dL    Comment: (NOTE) Biotin ingestion may interfere with free T4 tests. If the results  are inconsistent with the TSH level, previous test results, or the clinical presentation, then consider biotin interference. If needed, order repeat testing after stopping biotin. Performed at [facility], [address] [place], [place] [address]   Vitamin B12     Status: None   Collection Time: 12/29/22  9:18 AM  Result Value Ref Range   Vitamin B-12 353 180 - 914 pg/mL    Comment: (NOTE) This assay is not validated for testing neonatal or myeloproliferative syndrome specimens for Vitamin B12 levels. Performed at [facility], [address] [place], [place] [address]   Folate     Status: None   Collection Time: 12/29/22  9:18 AM  Result Value Ref Range   Folate 6.5 >5.9 ng/mL    Comment: Performed at [facility], [address] [place], [place] [address]  Lipid panel     Status: Abnormal   Collection Time: 12/29/22  9:18 AM  Result Value Ref Range   Cholesterol 193 0 - 200 mg/dL   Triglycerides 034 (H) <150 mg/dL   HDL 47 >74 mg/dL   Total CHOL/HDL Ratio 4.1 RATIO   VLDL UNABLE TO CALCULATE IF TRIGLYCERIDE OVER 400 mg/dL 0 - 40 mg/dL   LDL Cholesterol UNABLE TO CALCULATE IF TRIGLYCERIDE OVER 400 mg/dL 0 - 99 mg/dL    Comment:        Total Cholesterol/HDL:CHD Risk Coronary Heart Disease Risk Table                     Men   Women  1/2 Average Risk   3.4   3.3  Average Risk       5.0   4.4  2 X  Average Risk   9.6   7.1  3 X Average Risk  23.4   11.0        Use the calculated Patient Ratio above and the CHD Risk Table to determine the patient's CHD Risk.        ATP III CLASSIFICATION (LDL):  <100     mg/dL   Optimal  409-811  mg/dL   Near or Above                    Optimal  130-159  mg/dL   Borderline  914-782  mg/dL   High  >956     mg/dL   Very High Performed at [facility], [address] [place], [place] [address]   VITAMIN D 25 Hydroxy (Vit-D Deficiency, Fractures)     Status: Abnormal   Collection Time: 12/29/22   9:18 AM  Result Value Ref Range   Vit D, 25-Hydroxy 19.93 (L) 30 - 100 ng/mL    Comment: (NOTE) Vitamin D deficiency has been defined by the Institute of Medicine  and an Endocrine Society practice guideline as a level of serum 25-OH  vitamin D less than 20 ng/mL (1,2). The Endocrine Society went on to  further define vitamin D insufficiency as a level between 21 and 29  ng/mL (2).  1. IOM (Institute of Medicine). 2010. Dietary reference intakes for  calcium and D. [place]: [employer]. 2. Holick MF, Binkley [place], Bischoff-Ferrari HA, et al. Evaluation,  treatment, and prevention of vitamin D deficiency: an Endocrine  Society clinical practice guideline, JCEM. 2011 Jul; 96(7): 1911-30.  Performed at [facility], [address] [place], [place] [address]   LDL cholesterol, direct     Status: None   Collection Time: 12/29/22  9:18 AM  Result Value Ref Range   Direct LDL 74 0 - 99 mg/dL    Comment: Performed at [facility], [address], [place], [place] [address]    Radiology: MM 3D DIAGNOSTIC MAMMOGRAM UNILATERAL LEFT BREAST  Result Date: 02/14/2023 CLINICAL DATA:  BI-RADS 3 follow-up of LEFT breast calcifications as well as a LEFT breast asymmetry seen on MLO view. This was initiated in January 2024 from a callback in November 2023. History of LEFT breast excisional biopsy. EXAM: DIGITAL DIAGNOSTIC UNILATERAL LEFT MAMMOGRAM WITH TOMOSYNTHESIS AND CAD TECHNIQUE: Left digital diagnostic mammography and breast tomosynthesis was performed. The images were evaluated with computer-aided detection. COMPARISON:  Previous exam(s). ACR Breast Density Category b: There are scattered areas of fibroglandular density. FINDINGS: Spot magnification views of the LEFT breast demonstrate mild coarsening of a small group of coarse calcifications in the lower inner breast at anterior depth. No new suspicious findings are noted. Diagnostic images of the LEFT breast  demonstrate decreased conspicuity of a LEFT breast asymmetry on MLO view. No new suspicious findings are noted. IMPRESSION: 1. Stable probably benign LEFT breast calcifications likely reflecting early dystrophic calcifications status post LEFT breast excisional biopsy. Recommend follow-up diagnostic mammogram in December 2024. This will establish 1 year of definitive stability. 2. Decreased conspicuity of previously described LEFT breast asymmetry on MLO view. Recommend attention on follow-up diagnostic mammogram. RECOMMENDATION: Recommend bilateral diagnostic mammogram (with RIGHT and LEFT breast ultrasound if deemed necessary) in December 2024. Patient is due for contralateral screening at this point in time. I have discussed the findings and recommendations with the patient. If applicable, a reminder letter will be sent to the patient regarding the next  appointment. BI-RADS CATEGORY  3: Probably benign. Electronically Signed   By: Meda Klinefelter M.D.   On: 02/14/2023 14:40    No results found.  No results found.  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) 12/07/2019   Dysuria 12/07/2019   Essential hypertension 10/22/2019   Atopic dermatitis, mild 08/07/2017   Vaginal candidiasis 08/07/2017   Episodic cluster headache, not intractable 08/07/2017   Mild intermittent asthma 08/07/2017   Encounter for general adult medical examination with abnormal findings 08/07/2017   Abnormal Papanicolaou smear of cervix with positive human papilloma virus (HPV) test 08/07/2017   Allergic rhinitis 08/07/2017   Gastroesophageal reflux disease without esophagitis 08/07/2017   GAD (generalized anxiety disorder) 08/07/2017   Vitamin D deficiency 08/07/2017   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 Obstructive chronic bronchitis  without exacerbation (HCC) Will continue with current regimen needs a follow-up pulmonary function study this has been ordered - Pulmonary function test; Future  2. Obesity, morbid (HCC) Patient still needs to work on weight loss through exercise if possible.  Patient needs to work on dietary management also.  3. OSA (obstructive sleep apnea) On CPAP therapy. Patient has had good response we will continue to use CPAP at the current pressures  General Counseling: I have discussed the findings of the evaluation and examination with Deztiny.  I have also discussed any further diagnostic evaluation thatmay be needed or ordered today. Noele verbalizes understanding of the findings of todays visit. We also reviewed her medications today and discussed drug interactions and side effects including but not limited excessive drowsiness and altered mental states. We also discussed that there is always a risk not just to her but also people around her. she has been encouraged to call the office with any questions or concerns that should arise related to todays visit.  No orders of the defined types were placed in this encounter.    Time spent: 70  I have personally obtained a history, examined the patient, evaluated laboratory and imaging results, formulated the assessment and plan and placed orders.    Yevonne Pax, MD [facility] Pulmonary and Critical Care Sleep medicine

## 2023-03-28 ENCOUNTER — Other Ambulatory Visit: Payer: Self-pay

## 2023-03-29 DIAGNOSIS — G4733 Obstructive sleep apnea (adult) (pediatric): Secondary | ICD-10-CM | POA: Diagnosis not present

## 2023-04-11 ENCOUNTER — Ambulatory Visit: Payer: 59 | Admitting: Obstetrics & Gynecology

## 2023-04-11 ENCOUNTER — Other Ambulatory Visit (HOSPITAL_COMMUNITY)
Admission: RE | Admit: 2023-04-11 | Discharge: 2023-04-11 | Disposition: A | Discharge status: Home / Self Care | Payer: 59 | Source: Ambulatory Visit | Attending: Obstetrics & Gynecology | Admitting: Obstetrics & Gynecology

## 2023-04-11 ENCOUNTER — Encounter: Payer: Self-pay | Admitting: Obstetrics & Gynecology

## 2023-04-11 VITALS — BP 119/86 | HR 93 | Wt 175.1 lb

## 2023-04-11 DIAGNOSIS — N870 Mild cervical dysplasia: Secondary | ICD-10-CM | POA: Insufficient documentation

## 2023-04-11 NOTE — Progress Notes (Signed)
    GYNECOLOGY PROGRESS NOTE  Subjective:    Patient ID: Briana Snyder, female    DOB: 06-04-1965, 57 y.o.   MRN: [id number]  HPI  Patient is a 57 y.o. single G2P2 here for a colpo. She had a pap smear 09/2021 that was LGSIL with + HR HPV. She had a colpo 12/2021 that was normal. The pathology showed the following: A. ENDOCERVIX, CURETTAGE:  Detached strips of squamous epithelium with low-grade SIL(CIN-1/mild  dysplasia).  Additional strips of normal endocervical epithelium and atrophic  endometrial surface epithelium.  Negative for high-grade SIL and glandular neoplasia.   She has a h/o colon cancer. She will have a consultation for a left hip replacement in the near future.   The following portions of the patient's history were reviewed and updated as appropriate: allergies, current medications, past family history, past medical history, past social history, past surgical history, and problem list.  Review of Systems Pertinent items are noted in HPI.  She went through menopause around age 54 .  Objective:   Last menstrual period 02/08/2015. There is no height or weight on file to calculate BMI. Well nourished, well hydrated Black female, no apparent distress She is ambulating and conversing normally. Consent signed, time out done Speculum placed. Cervix prepped with acetic acid. Transformation zone seen. Colpo adequate. Colposcopic findings normal. ECC obtained. She tolerated the procedure well.   Assessment:   1. CIN I (cervical intraepithelial neoplasia I)      Plan:   1. CIN I (cervical intraepithelial neoplasia I) - await pathology

## 2023-04-13 DIAGNOSIS — M1612 Unilateral primary osteoarthritis, left hip: Secondary | ICD-10-CM | POA: Diagnosis not present

## 2023-04-13 DIAGNOSIS — M25552 Pain in left hip: Secondary | ICD-10-CM | POA: Diagnosis not present

## 2023-04-13 LAB — SURGICAL PATHOLOGY

## 2023-04-14 ENCOUNTER — Telehealth: Payer: Self-pay | Admitting: Physician Assistant

## 2023-04-14 NOTE — Telephone Encounter (Signed)
Received surgical clearance for pcp and pulmonary. Gave one to dsk and one to [employer]

## 2023-04-14 NOTE — Telephone Encounter (Signed)
Orthopedic surgery clearance by pcp faxed back to [facility]; [phone].Scanned-Toni

## 2023-04-15 ENCOUNTER — Other Ambulatory Visit: Payer: Self-pay | Admitting: Orthopedic Surgery

## 2023-04-15 ENCOUNTER — Other Ambulatory Visit: Payer: Self-pay

## 2023-04-18 ENCOUNTER — Telehealth: Payer: Self-pay | Admitting: Obstetrics & Gynecology

## 2023-04-18 ENCOUNTER — Other Ambulatory Visit: Payer: Self-pay

## 2023-04-18 ENCOUNTER — Telehealth: Payer: Self-pay | Admitting: Internal Medicine

## 2023-04-18 ENCOUNTER — Telehealth: Payer: Self-pay | Admitting: Physician Assistant

## 2023-04-18 ENCOUNTER — Encounter: Payer: Self-pay | Admitting: Obstetrics & Gynecology

## 2023-04-18 NOTE — Telephone Encounter (Signed)
I called her and told

## 2023-04-18 NOTE — Telephone Encounter (Signed)
Based on your pathology results (+ ECC, twice now), I recommend a LEEP procedure.

## 2023-04-18 NOTE — Telephone Encounter (Signed)
surgical clearance for pulmonary faxed back to [facility]; [phone]. Scanned-Toni

## 2023-04-18 NOTE — Telephone Encounter (Signed)
surgical clearance for pcp faxed back to [facility] on 04/14/23-Toni

## 2023-04-20 ENCOUNTER — Other Ambulatory Visit: Payer: Self-pay | Admitting: Physician Assistant

## 2023-04-20 ENCOUNTER — Ambulatory Visit: Payer: 59 | Admitting: Internal Medicine

## 2023-04-20 DIAGNOSIS — R928 Other abnormal and inconclusive findings on diagnostic imaging of breast: Secondary | ICD-10-CM

## 2023-04-20 DIAGNOSIS — N6489 Other specified disorders of breast: Secondary | ICD-10-CM

## 2023-04-20 DIAGNOSIS — Z1231 Encounter for screening mammogram for malignant neoplasm of breast: Secondary | ICD-10-CM

## 2023-04-20 DIAGNOSIS — R921 Mammographic calcification found on diagnostic imaging of breast: Secondary | ICD-10-CM

## 2023-04-20 DIAGNOSIS — J4489 Other specified chronic obstructive pulmonary disease: Secondary | ICD-10-CM

## 2023-04-27 ENCOUNTER — Other Ambulatory Visit: Payer: 59

## 2023-04-28 DIAGNOSIS — G4733 Obstructive sleep apnea (adult) (pediatric): Secondary | ICD-10-CM | POA: Diagnosis not present

## 2023-05-02 ENCOUNTER — Other Ambulatory Visit: Payer: Self-pay

## 2023-05-02 ENCOUNTER — Encounter
Admission: RE | Admit: 2023-05-02 | Discharge: 2023-05-02 | Disposition: A | Discharge status: Home / Self Care | Payer: 59 | Source: Ambulatory Visit | Attending: Orthopedic Surgery | Admitting: Orthopedic Surgery

## 2023-05-02 VITALS — BP 149/104 | HR 88 | Temp 98.0°F | Resp 18 | Ht 66.5 in | Wt 180.1 lb

## 2023-05-02 DIAGNOSIS — Z01812 Encounter for preprocedural laboratory examination: Secondary | ICD-10-CM

## 2023-05-02 DIAGNOSIS — Z01818 Encounter for other preprocedural examination: Secondary | ICD-10-CM | POA: Diagnosis present

## 2023-05-02 DIAGNOSIS — I10 Essential (primary) hypertension: Secondary | ICD-10-CM | POA: Insufficient documentation

## 2023-05-02 DIAGNOSIS — Z01810 Encounter for preprocedural cardiovascular examination: Secondary | ICD-10-CM | POA: Diagnosis not present

## 2023-05-02 HISTORY — DX: Atopic dermatitis, unspecified: L20.9

## 2023-05-02 HISTORY — DX: Vitamin D deficiency, unspecified: E55.9

## 2023-05-02 HISTORY — DX: Gastro-esophageal reflux disease without esophagitis: K21.9

## 2023-05-02 HISTORY — DX: Sleep apnea, unspecified: G47.30

## 2023-05-02 HISTORY — DX: Dysuria: R30.0

## 2023-05-02 HISTORY — DX: Alcohol dependence with alcohol-induced psychotic disorder with delusions: F10.250

## 2023-05-02 HISTORY — DX: Allergic rhinitis, unspecified: J30.9

## 2023-05-02 HISTORY — DX: Episodic cluster headache, not intractable: G44.019

## 2023-05-02 HISTORY — DX: Unspecified convulsions: R56.9

## 2023-05-02 LAB — COMPREHENSIVE METABOLIC PANEL
ALT: 15 U/L (ref 0–44)
AST: 18 U/L (ref 15–41)
Albumin: 4 g/dL (ref 3.5–5.0)
Alkaline Phosphatase: 68 U/L (ref 38–126)
Anion gap: 7 (ref 5–15)
BUN: 14 mg/dL (ref 6–20)
CO2: 26 mmol/L (ref 22–32)
Calcium: 8.6 mg/dL — ABNORMAL LOW (ref 8.9–10.3)
Chloride: 108 mmol/L (ref 98–111)
Creatinine, Ser: 0.71 mg/dL (ref 0.44–1.00)
GFR, Estimated: >60 mL/min (ref >60)
Glucose, Bld: 82 mg/dL (ref 70–99)
Potassium: 3.9 mmol/L (ref 3.5–5.1)
Sodium: 141 mmol/L (ref 135–145)
Total Bilirubin: 0.9 mg/dL (ref <1.2)
Total Protein: 7.2 g/dL (ref 6.5–8.1)

## 2023-05-02 LAB — CBC WITH DIFFERENTIAL/PLATELET
Abs Immature Granulocytes: 0.01 10*3/uL (ref 0.00–0.07)
Basophils Absolute: 0 10*3/uL (ref 0.0–0.1)
Basophils Relative: 1 %
Eosinophils Absolute: 0.2 10*3/uL (ref 0.0–0.5)
Eosinophils Relative: 5 %
HCT: 36.1 % (ref 36.0–46.0)
Hemoglobin: 12 g/dL (ref 12.0–15.0)
Immature Granulocytes: 0 %
Lymphocytes Relative: 37 %
Lymphs Abs: 1.2 10*3/uL (ref 0.7–4.0)
MCH: 29.4 pg (ref 26.0–34.0)
MCHC: 33.2 g/dL (ref 30.0–36.0)
MCV: 88.5 fL (ref 80.0–100.0)
Monocytes Absolute: 0.3 10*3/uL (ref 0.1–1.0)
Monocytes Relative: 8 %
Neutro Abs: 1.6 10*3/uL — ABNORMAL LOW (ref 1.7–7.7)
Neutrophils Relative %: 49 %
Platelets: 178 10*3/uL (ref 150–400)
RBC: 4.08 MIL/uL (ref 3.87–5.11)
RDW: 15.3 % (ref 11.5–15.5)
WBC: 3.3 10*3/uL — ABNORMAL LOW (ref 4.0–10.5)
nRBC: 0 % (ref 0.0–0.2)

## 2023-05-02 LAB — URINALYSIS, ROUTINE W REFLEX MICROSCOPIC
Bilirubin Urine: NEGATIVE
Glucose, UA: NEGATIVE mg/dL
Ketones, ur: NEGATIVE mg/dL
Leukocytes,Ua: NEGATIVE
Nitrite: NEGATIVE
Protein, ur: NEGATIVE mg/dL
Specific Gravity, Urine: 1.014 (ref 1.005–1.030)
pH: 6 (ref 5.0–8.0)

## 2023-05-02 LAB — SURGICAL PCR SCREEN
MRSA, PCR: NEGATIVE
Staphylococcus aureus: NEGATIVE

## 2023-05-02 LAB — TYPE AND SCREEN
ABO/RH(D): B POS
Antibody Screen: NEGATIVE

## 2023-05-02 NOTE — Patient Instructions (Addendum)
Your procedure is scheduled on:  Monday December 16  Report to the Registration Desk on the 1st floor of the [facility] To find out your arrival time, please call [phone] between 1PM - 3PM on:  Friday December 13  If your arrival time is 6:00 am, do not arrive before that time as the [facility] entrance doors do not open until 6:00 am.  REMEMBER: Instructions that are not followed completely may result in serious medical risk, up to and including death; or upon the discretion of your surgeon and anesthesiologist your surgery may need to be rescheduled.  Do not eat food after midnight the night before surgery.  No gum chewing or hard candies.  You may however, drink CLEAR liquids up to 2 hours before you are scheduled to arrive for your surgery. Do not drink anything within 2 hours of your scheduled arrival time.  Clear liquids include: - water  - apple juice without pulp - gatorade (not RED colors) - black coffee or tea (Do NOT add milk or creamers to the coffee or tea) Do NOT drink anything that is not on this list.   In addition, your doctor has ordered for you to drink the provided:  Ensure Pre-Surgery Clear Carbohydrate Drink  Drinking this carbohydrate drink up to two hours before surgery helps to reduce insulin resistance and improve patient outcomes. Please complete drinking 2 hours before scheduled arrival time.  One week prior to surgery:  Monday December 9  Stop Anti-inflammatories (NSAIDS) such as Advil, Aleve, Ibuprofen, Motrin, Naproxen, Naprosyn and Aspirin based products such as Excedrin, Goody's Powder, BC Powder.  meloxicam (MOBIC)  Stop ANY OVER THE COUNTER supplements until after surgery.  You may however, continue to take Tylenol if needed for pain up until the day of surgery.  Continue taking all of your other prescription medications up until the day of surgery.  ON THE DAY OF SURGERY ONLY TAKE THESE MEDICATIONS WITH SIPS OF WATER:  amLODipine  (NORVASC)  busPIRone (BUSPAR)  Fluticasone-Umeclidin-Vilant (TRELEGY ELLIPTA )  rosuvastatin (CRESTOR)   Use inhalers on the day of surgery and bring to the hospital. albuterol (VENTOLIN HFA)    No Alcohol for 24 hours before or after surgery.  No Smoking including e-cigarettes for 24 hours before surgery.  No chewable tobacco products for at least 6 hours before surgery.  No nicotine patches on the day of surgery.  Do not use any "recreational" drugs for at least a week (preferably 2 weeks) before your surgery.  Please be advised that the combination of cocaine and anesthesia may have negative outcomes, up to and including death. If you test positive for cocaine, your surgery will be cancelled.  On the morning of surgery brush your teeth with toothpaste and water, you may rinse your mouth with mouthwash if you wish. Do not swallow any toothpaste or mouthwash.  Use CHG Soap or wipes as directed on instruction sheet.  Do not wear jewelry, make-up, hairpins, clips or nail polish.  For welded (permanent) jewelry: bracelets, anklets, waist bands, etc.  Please have this removed prior to surgery.  If it is not removed, there is a chance that hospital personnel will need to cut it off on the day of surgery.  Do not wear lotions, powders, or perfumes.   Do not shave body hair from the neck down 48 hours before surgery.  Contact lenses, hearing aids and dentures may not be worn into surgery.  Do not bring valuables to the hospital.   is not responsible for any missing/lost belongings or valuables.   Notify your doctor if there is any change in your medical condition (cold, fever, infection).  Wear comfortable clothing (specific to your surgery type) to the hospital.  After surgery, you can help prevent lung complications by doing breathing exercises.  Take deep breaths and cough every 1-2 hours. Your doctor may order a device called an Incentive Spirometer to help you take  deep breaths.   If you are being admitted to the hospital overnight, leave your suitcase in the car. After surgery it may be brought to your room.  In case of increased patient census, it may be necessary for you, the patient, to continue your postoperative care in the Same Day Surgery department.  If you are being discharged the day of surgery, you will not be allowed to drive home. You will need a responsible individual to drive you home and stay with you for 24 hours after surgery.   If you are taking public transportation, you will need to have a responsible individual with you.  Please call the Pre-admissions Testing Dept. at [phone] if you have any questions about these instructions.  Surgery Visitation Policy:  Patients having surgery or a procedure may have two visitors.  Children under the age of 41 must have an adult with them who is not the patient.  Inpatient Visitation:    Visiting hours are 7 a.m. to 8 p.m. Up to four visitors are allowed at one time in a patient room. The visitors may rotate out with other people during the day.  One visitor age 55 or older may stay with the patient overnight and must be in the room by 8 p.m.       Pre-operative 5 CHG Bath Instructions   You can play a key role in reducing the risk of infection after surgery. Your skin needs to be as free of germs as possible. You can reduce the number of germs on your skin by washing with CHG (chlorhexidine gluconate) soap before surgery. CHG is an antiseptic soap that kills germs and continues to kill germs even after washing.   DO NOT use if you have an allergy to chlorhexidine/CHG or antibacterial soaps. If your skin becomes reddened or irritated, stop using the CHG and notify one of our RNs at [phone].   Please shower with the CHG soap starting 4 days before surgery using the following schedule:     Please keep in mind the following:  DO NOT shave, including legs and underarms,  starting the day of your first shower.   You may shave your face at any point before/day of surgery.  Place clean sheets on your bed the day you start using CHG soap. Use a clean washcloth (not used since being washed) for each shower. DO NOT sleep with pets once you start using the CHG.   CHG Shower Instructions:  If you choose to wash your hair and private area, wash first with your normal shampoo/soap.  After you use shampoo/soap, rinse your hair and body thoroughly to remove shampoo/soap residue.  Turn the water OFF and apply about 3 tablespoons (45 ml) of CHG soap to a CLEAN washcloth.  Apply CHG soap ONLY FROM YOUR NECK DOWN TO YOUR TOES (washing for 3-5 minutes)  DO NOT use CHG soap on face, private areas, open wounds, or sores.  Pay special attention to the area where your surgery is being performed.  If you are  having back surgery, having someone wash your back for you may be helpful. Wait 2 minutes after CHG soap is applied, then you may rinse off the CHG soap.  Pat dry with a clean towel  Put on clean clothes/pajamas   If you choose to wear lotion, please use ONLY the CHG-compatible lotions on the back of this paper.     Additional instructions for the day of surgery: DO NOT APPLY any lotions, deodorants, cologne, or perfumes.   Put on clean/comfortable clothes.  Brush your teeth.  Ask your nurse before applying any prescription medications to the skin.      CHG Compatible Lotions   Aveeno Moisturizing lotion  Cetaphil Moisturizing Cream  Cetaphil Moisturizing Lotion  Clairol Herbal Essence Moisturizing Lotion, Dry Skin  Clairol Herbal Essence Moisturizing Lotion, Extra Dry Skin  Clairol Herbal Essence Moisturizing Lotion, Normal Skin  Curel Age Defying Therapeutic Moisturizing Lotion with Alpha Hydroxy  Curel Extreme Care Body Lotion  Curel Soothing Hands Moisturizing Hand Lotion  Curel Therapeutic Moisturizing Cream, Fragrance-Free  Curel Therapeutic Moisturizing  Lotion, Fragrance-Free  Curel Therapeutic Moisturizing Lotion, Original Formula  Eucerin Daily Replenishing Lotion  Eucerin Dry Skin Therapy Plus Alpha Hydroxy Crme  Eucerin Dry Skin Therapy Plus Alpha Hydroxy Lotion  Eucerin Original Crme  Eucerin Original Lotion  Eucerin Plus Crme Eucerin Plus Lotion  Eucerin TriLipid Replenishing Lotion  Keri Anti-Bacterial Hand Lotion  Keri Deep Conditioning Original Lotion Dry Skin Formula Softly Scented  Keri Deep Conditioning Original Lotion, Fragrance Free Sensitive Skin Formula  Keri Lotion Fast Absorbing Fragrance Free Sensitive Skin Formula  Keri Lotion Fast Absorbing Softly Scented Dry Skin Formula  Keri Original Lotion  Keri Skin Renewal Lotion Keri Silky Smooth Lotion  Keri Silky Smooth Sensitive Skin Lotion  Nivea Body Creamy Conditioning Oil  Nivea Body Extra Enriched Lotion  Nivea Body Original Lotion  Nivea Body Sheer Moisturizing Lotion Nivea Crme  Nivea Skin Firming Lotion  NutraDerm 30 Skin Lotion  NutraDerm Skin Lotion  NutraDerm Therapeutic Skin Cream  NutraDerm Therapeutic Skin Lotion  ProShield Protective Hand Cream  Provon moisturizing lotion     How to Use an Incentive Spirometer  An incentive spirometer is a tool that measures how well you are filling your lungs with each breath. Learning to take long, deep breaths using this tool can help you keep your lungs clear and active. This may help to reverse or lessen your chance of developing breathing (pulmonary) problems, especially infection. You may be asked to use a spirometer: After a surgery. If you have a lung problem or a history of smoking. After a long period of time when you have been unable to move or be active. If the spirometer includes an indicator to show the highest number that you have reached, your health care provider or respiratory therapist will help you set a goal. Keep a log of your progress as told by your health care provider. What are the  risks? Breathing too quickly may cause dizziness or cause you to pass out. Take your time so you do not get dizzy or light-headed. If you are in pain, you may need to take pain medicine before doing incentive spirometry. It is harder to take a deep breath if you are having pain. How to use your incentive spirometer  Sit up on the edge of your bed or on a chair. Hold the incentive spirometer so that it is in an upright position. Before you use the spirometer, breathe out normally. Place the  mouthpiece in your mouth. Make sure your lips are closed tightly around it. Breathe in slowly and as deeply as you can through your mouth, causing the piston or the ball to rise toward the top of the chamber. Hold your breath for 3-5 seconds, or for as long as possible. If the spirometer includes a coach indicator, use this to guide you in breathing. Slow down your breathing if the indicator goes above the marked areas. Remove the mouthpiece from your mouth and breathe out normally. The piston or ball will return to the bottom of the chamber. Rest for a few seconds, then repeat the steps 10 or more times. Take your time and take a few normal breaths between deep breaths so that you do not get dizzy or light-headed. Do this every 1-2 hours when you are awake. If the spirometer includes a goal marker to show the highest number you have reached (best effort), use this as a goal to work toward during each repetition. After each set of 10 deep breaths, cough a few times. This will help to make sure that your lungs are clear. If you have an incision on your chest or abdomen from surgery, place a pillow or a rolled-up towel firmly against the incision when you cough. This can help to reduce pain while taking deep breaths and coughing. General tips When you are able to get out of bed: Walk around often. Continue to take deep breaths and cough in order to clear your lungs. Keep using the incentive spirometer until  your health care provider says it is okay to stop using it. If you have been in the hospital, you may be told to keep using the spirometer at home. Contact a health care provider if: You are having difficulty using the spirometer. You have trouble using the spirometer as often as instructed. Your pain medicine is not giving enough relief for you to use the spirometer as told. You have a fever. Get help right away if: You develop shortness of breath. You develop a cough with bloody mucus from the lungs. You have fluid or blood coming from an incision site after you cough. Summary An incentive spirometer is a tool that can help you learn to take long, deep breaths to keep your lungs clear and active. You may be asked to use a spirometer after a surgery, if you have a lung problem or a history of smoking, or if you have been inactive for a long period of time. Use your incentive spirometer as instructed every 1-2 hours while you are awake. If you have an incision on your chest or abdomen, place a pillow or a rolled-up towel firmly against your incision when you cough. This will help to reduce pain. Get help right away if you have shortness of breath, you cough up bloody mucus, or blood comes from your incision when you cough. This information is not intended to replace advice given to you by your health care provider. Make sure you discuss any questions you have with your health care provider. Document Revised: 07/30/2019 Document Reviewed: 07/30/2019 Elsevier Patient Education  2023 [employer]vier Inc.              Preoperative Educational Videos for Total Hip, Knee and Shoulder Replacements  To better prepare for surgery, please view our videos that explain the physical activity and discharge planning required to have the best surgical recovery at [facility].  [email or web address]  Questions? Call [phone] or email  [email or web address]

## 2023-05-09 ENCOUNTER — Other Ambulatory Visit: Payer: Self-pay

## 2023-05-09 ENCOUNTER — Ambulatory Visit: Payer: 59

## 2023-05-09 ENCOUNTER — Ambulatory Visit: Payer: 59 | Admitting: General Practice

## 2023-05-09 ENCOUNTER — Encounter: Payer: Self-pay | Admitting: Orthopedic Surgery

## 2023-05-09 ENCOUNTER — Encounter
Admission: RE | Disposition: A | Discharge status: Home Health | Payer: Self-pay | Source: Home / Self Care | Attending: Orthopedic Surgery

## 2023-05-09 ENCOUNTER — Observation Stay
Admission: RE | Admit: 2023-05-09 | Discharge: 2023-05-10 | Disposition: A | Discharge status: Home Health | Payer: 59 | Attending: Orthopedic Surgery | Admitting: Orthopedic Surgery

## 2023-05-09 ENCOUNTER — Ambulatory Visit: Payer: 59 | Admitting: Urgent Care

## 2023-05-09 DIAGNOSIS — I10 Essential (primary) hypertension: Secondary | ICD-10-CM | POA: Diagnosis not present

## 2023-05-09 DIAGNOSIS — Z79899 Other long term (current) drug therapy: Secondary | ICD-10-CM | POA: Diagnosis not present

## 2023-05-09 DIAGNOSIS — Z96642 Presence of left artificial hip joint: Principal | ICD-10-CM | POA: Diagnosis present

## 2023-05-09 DIAGNOSIS — Z85038 Personal history of other malignant neoplasm of large intestine: Secondary | ICD-10-CM | POA: Insufficient documentation

## 2023-05-09 DIAGNOSIS — M1612 Unilateral primary osteoarthritis, left hip: Secondary | ICD-10-CM | POA: Diagnosis not present

## 2023-05-09 DIAGNOSIS — J45909 Unspecified asthma, uncomplicated: Secondary | ICD-10-CM | POA: Diagnosis not present

## 2023-05-09 HISTORY — PX: TOTAL HIP ARTHROPLASTY: SHX124

## 2023-05-09 LAB — ABO/RH: ABO/RH(D): B POS

## 2023-05-09 SURGERY — ARTHROPLASTY, HIP, TOTAL, ANTERIOR APPROACH
Anesthesia: Spinal | Site: Hip | Laterality: Left

## 2023-05-09 MED ORDER — ALBUTEROL SULFATE (2.5 MG/3ML) 0.083% IN NEBU: 2.5000 mg | INHALATION_SOLUTION | Freq: Four times a day (QID) | RESPIRATORY_TRACT | Status: DC | PRN

## 2023-05-09 MED ORDER — SODIUM CHLORIDE 0.9 % IV SOLN: INTRAVENOUS | Status: DC

## 2023-05-09 MED ORDER — ACETAMINOPHEN 500 MG PO TABS
1000.0000 mg | ORAL_TABLET | Freq: Three times a day (TID) | ORAL | Status: DC
  Administered 2023-05-09 – 2023-05-10 (×2): 1000 mg via ORAL

## 2023-05-09 MED ORDER — KETOROLAC TROMETHAMINE 15 MG/ML IJ SOLN
INTRAMUSCULAR | Status: AC
  Filled 2023-05-09: qty 1

## 2023-05-09 MED ORDER — TRANEXAMIC ACID-NACL 1000-0.7 MG/100ML-% IV SOLN
INTRAVENOUS | Status: AC
  Filled 2023-05-09: qty 100

## 2023-05-09 MED ORDER — ONDANSETRON HCL 4 MG/2ML IJ SOLN
4.0000 mg | Freq: Four times a day (QID) | INTRAMUSCULAR | Status: DC | PRN
Start: 2023-05-09 — End: 2023-05-10

## 2023-05-09 MED ORDER — FENTANYL CITRATE (PF) 100 MCG/2ML IJ SOLN
INTRAMUSCULAR | Status: AC
  Filled 2023-05-09: qty 2

## 2023-05-09 MED ORDER — FENTANYL CITRATE (PF) 100 MCG/2ML IJ SOLN
25.0000 ug | INTRAMUSCULAR | Status: DC | PRN
  Administered 2023-05-09 (×3): 50 ug via INTRAVENOUS

## 2023-05-09 MED ORDER — AMLODIPINE BESYLATE 5 MG PO TABS: 2.5000 mg | ORAL_TABLET | Freq: Every day | ORAL | Status: DC

## 2023-05-09 MED ORDER — KETOROLAC TROMETHAMINE 15 MG/ML IJ SOLN
15.0000 mg | Freq: Four times a day (QID) | INTRAMUSCULAR | Status: AC
  Administered 2023-05-09 – 2023-05-10 (×4): 15 mg via INTRAVENOUS

## 2023-05-09 MED ORDER — CEFAZOLIN SODIUM-DEXTROSE 2-4 GM/100ML-% IV SOLN
2.0000 g | INTRAVENOUS | Status: AC
  Administered 2023-05-09: 2 g via INTRAVENOUS

## 2023-05-09 MED ORDER — PROPOFOL 500 MG/50ML IV EMUL
INTRAVENOUS | Status: DC | PRN
  Administered 2023-05-09: 80 ug/kg/min via INTRAVENOUS

## 2023-05-09 MED ORDER — CHLORHEXIDINE GLUCONATE 0.12 % MT SOLN
OROMUCOSAL | Status: AC
  Filled 2023-05-09: qty 15

## 2023-05-09 MED ORDER — 0.9 % SODIUM CHLORIDE (POUR BTL) OPTIME
TOPICAL | Status: DC | PRN
  Administered 2023-05-09: 500 mL

## 2023-05-09 MED ORDER — DEXAMETHASONE SODIUM PHOSPHATE 10 MG/ML IJ SOLN: 8.0000 mg | Freq: Once | INTRAMUSCULAR | Status: DC

## 2023-05-09 MED ORDER — DOCUSATE SODIUM 100 MG PO CAPS
100.0000 mg | ORAL_CAPSULE | Freq: Two times a day (BID) | ORAL | Status: DC
  Administered 2023-05-09 – 2023-05-10 (×2): 100 mg via ORAL

## 2023-05-09 MED ORDER — MIDAZOLAM HCL 2 MG/2ML IJ SOLN
INTRAMUSCULAR | Status: AC
Start: 2023-05-09 — End: ?
  Filled 2023-05-09: qty 2

## 2023-05-09 MED ORDER — PANTOPRAZOLE SODIUM 40 MG PO TBEC
40.0000 mg | DELAYED_RELEASE_TABLET | Freq: Every day | ORAL | Status: DC
Start: 2023-05-09 — End: 2023-05-10
  Administered 2023-05-09 – 2023-05-10 (×2): 40 mg via ORAL

## 2023-05-09 MED ORDER — OXYCODONE HCL 5 MG PO TABS
5.0000 mg | ORAL_TABLET | Freq: Once | ORAL | Status: AC | PRN
  Administered 2023-05-09: 5 mg via ORAL

## 2023-05-09 MED ORDER — ENOXAPARIN SODIUM 40 MG/0.4ML IJ SOSY
40.0000 mg | PREFILLED_SYRINGE | INTRAMUSCULAR | Status: DC
  Administered 2023-05-10: 40 mg via SUBCUTANEOUS

## 2023-05-09 MED ORDER — DOCUSATE SODIUM 100 MG PO CAPS
ORAL_CAPSULE | ORAL | Status: AC
  Filled 2023-05-09: qty 1

## 2023-05-09 MED ORDER — BUPIVACAINE LIPOSOME 1.3 % IJ SUSP
INTRAMUSCULAR | Status: AC
  Filled 2023-05-09: qty 20

## 2023-05-09 MED ORDER — BUPIVACAINE HCL (PF) 0.5 % IJ SOLN
INTRAMUSCULAR | Status: DC | PRN
  Administered 2023-05-09: 2.5 mL

## 2023-05-09 MED ORDER — OXYCODONE HCL 5 MG PO TABS
ORAL_TABLET | ORAL | Status: AC
  Filled 2023-05-09: qty 1

## 2023-05-09 MED ORDER — CEFAZOLIN SODIUM-DEXTROSE 2-4 GM/100ML-% IV SOLN
INTRAVENOUS | Status: AC
  Filled 2023-05-09: qty 100

## 2023-05-09 MED ORDER — BUSPIRONE HCL 10 MG PO TABS
10.0000 mg | ORAL_TABLET | Freq: Every day | ORAL | Status: DC
  Administered 2023-05-10: 10 mg via ORAL
  Filled 2023-05-09: qty 1

## 2023-05-09 MED ORDER — ORAL CARE MOUTH RINSE: 15.0000 mL | Freq: Once | OROMUCOSAL | Status: AC

## 2023-05-09 MED ORDER — FENTANYL CITRATE (PF) 100 MCG/2ML IJ SOLN
INTRAMUSCULAR | Status: DC | PRN
  Administered 2023-05-09: 50 ug via INTRAVENOUS

## 2023-05-09 MED ORDER — TRANEXAMIC ACID-NACL 1000-0.7 MG/100ML-% IV SOLN
1000.0000 mg | INTRAVENOUS | Status: AC
  Administered 2023-05-09 (×2): 1000 mg via INTRAVENOUS

## 2023-05-09 MED ORDER — BUPIVACAINE-EPINEPHRINE (PF) 0.25% -1:200000 IJ SOLN
INTRAMUSCULAR | Status: AC
  Filled 2023-05-09: qty 30

## 2023-05-09 MED ORDER — TRAMADOL HCL 50 MG PO TABS
50.0000 mg | ORAL_TABLET | Freq: Four times a day (QID) | ORAL | Status: DC | PRN
  Administered 2023-05-09 – 2023-05-10 (×3): 50 mg via ORAL

## 2023-05-09 MED ORDER — HYDROCODONE-ACETAMINOPHEN 5-325 MG PO TABS
1.0000 | ORAL_TABLET | ORAL | Status: DC | PRN
Start: 2023-05-09 — End: 2023-05-10

## 2023-05-09 MED ORDER — TRAMADOL HCL 50 MG PO TABS
ORAL_TABLET | ORAL | Status: AC
  Filled 2023-05-09: qty 1

## 2023-05-09 MED ORDER — PHENYLEPHRINE HCL-NACL 20-0.9 MG/250ML-% IV SOLN
INTRAVENOUS | Status: DC | PRN
  Administered 2023-05-09: 40 ug/min via INTRAVENOUS

## 2023-05-09 MED ORDER — SURGIPHOR WOUND IRRIGATION SYSTEM - OPTIME: TOPICAL | Status: DC | PRN

## 2023-05-09 MED ORDER — PHENYLEPHRINE HCL-NACL 20-0.9 MG/250ML-% IV SOLN
INTRAVENOUS | Status: AC
  Filled 2023-05-09: qty 250

## 2023-05-09 MED ORDER — DEXMEDETOMIDINE HCL IN NACL 80 MCG/20ML IV SOLN
INTRAVENOUS | Status: DC | PRN
  Administered 2023-05-09 (×3): 8 ug via INTRAVENOUS

## 2023-05-09 MED ORDER — METOCLOPRAMIDE HCL 10 MG PO TABS: 5.0000 mg | ORAL_TABLET | Freq: Three times a day (TID) | ORAL | Status: DC | PRN

## 2023-05-09 MED ORDER — SODIUM CHLORIDE 0.9 % IR SOLN
Status: DC | PRN
  Administered 2023-05-09: 150 mL

## 2023-05-09 MED ORDER — MIDAZOLAM HCL 5 MG/5ML IJ SOLN
INTRAMUSCULAR | Status: DC | PRN
  Administered 2023-05-09: 2 mg via INTRAVENOUS

## 2023-05-09 MED ORDER — ONDANSETRON HCL 4 MG PO TABS: 4.0000 mg | ORAL_TABLET | Freq: Four times a day (QID) | ORAL | Status: DC | PRN

## 2023-05-09 MED ORDER — MENTHOL 3 MG MT LOZG: 1.0000 | LOZENGE | OROMUCOSAL | Status: DC | PRN

## 2023-05-09 MED ORDER — ACETAMINOPHEN 325 MG PO TABS: 325.0000 mg | ORAL_TABLET | Freq: Four times a day (QID) | ORAL | Status: DC | PRN

## 2023-05-09 MED ORDER — SODIUM CHLORIDE (PF) 0.9 % IJ SOLN
INTRAMUSCULAR | Status: DC | PRN
  Administered 2023-05-09: 50 mL

## 2023-05-09 MED ORDER — OXYCODONE HCL 5 MG/5ML PO SOLN
5.0000 mg | Freq: Once | ORAL | Status: AC | PRN
Start: 2023-05-09 — End: 2023-05-09

## 2023-05-09 MED ORDER — ROSUVASTATIN CALCIUM 5 MG PO TABS
5.0000 mg | ORAL_TABLET | Freq: Every day | ORAL | Status: DC
Start: 2023-05-10 — End: 2023-05-10
  Administered 2023-05-10: 5 mg via ORAL
  Filled 2023-05-09: qty 1

## 2023-05-09 MED ORDER — KETOROLAC TROMETHAMINE 15 MG/ML IJ SOLN
INTRAMUSCULAR | Status: AC
Start: 2023-05-09 — End: ?
  Filled 2023-05-09: qty 1

## 2023-05-09 MED ORDER — PHENOL 1.4 % MT LIQD: 1.0000 | OROMUCOSAL | Status: DC | PRN

## 2023-05-09 MED ORDER — CHLORHEXIDINE GLUCONATE 0.12 % MT SOLN
15.0000 mL | Freq: Once | OROMUCOSAL | Status: AC
  Administered 2023-05-09: 15 mL via OROMUCOSAL

## 2023-05-09 MED ORDER — CEFAZOLIN SODIUM-DEXTROSE 2-4 GM/100ML-% IV SOLN
2.0000 g | Freq: Four times a day (QID) | INTRAVENOUS | Status: AC
  Administered 2023-05-09 (×2): 2 g via INTRAVENOUS

## 2023-05-09 MED ORDER — ACETAMINOPHEN 500 MG PO TABS
ORAL_TABLET | ORAL | Status: AC
  Filled 2023-05-09: qty 2

## 2023-05-09 MED ORDER — METOCLOPRAMIDE HCL 5 MG/ML IJ SOLN: 5.0000 mg | Freq: Three times a day (TID) | INTRAMUSCULAR | Status: DC | PRN

## 2023-05-09 MED ORDER — LACTATED RINGERS IV SOLN: INTRAVENOUS | Status: DC

## 2023-05-09 MED ORDER — PANTOPRAZOLE SODIUM 40 MG PO TBEC
DELAYED_RELEASE_TABLET | ORAL | Status: AC
Start: 2023-05-09 — End: ?
  Filled 2023-05-09: qty 1

## 2023-05-09 MED ORDER — MORPHINE SULFATE (PF) 4 MG/ML IV SOLN: 0.5000 mg | INTRAVENOUS | Status: DC | PRN

## 2023-05-09 MED ORDER — PHENYLEPHRINE 80 MCG/ML (10ML) SYRINGE FOR IV PUSH (FOR BLOOD PRESSURE SUPPORT)
PREFILLED_SYRINGE | INTRAVENOUS | Status: DC | PRN
  Administered 2023-05-09: 80 ug via INTRAVENOUS

## 2023-05-09 MED ORDER — ONDANSETRON HCL 4 MG/2ML IJ SOLN
INTRAMUSCULAR | Status: DC | PRN
  Administered 2023-05-09: 4 mg via INTRAVENOUS

## 2023-05-09 MED ORDER — DEXAMETHASONE SODIUM PHOSPHATE 10 MG/ML IJ SOLN
INTRAMUSCULAR | Status: DC | PRN
  Administered 2023-05-09: 10 mg via INTRAVENOUS

## 2023-05-09 SURGICAL SUPPLY — 61 items
BLADE CLIPPER SURG (BLADE) IMPLANT
BLADE SAGITTAL AGGR TOOTH XLG (BLADE) ×1 IMPLANT
BNDG COHESIVE 6X5 TAN ST LF (GAUZE/BANDAGES/DRESSINGS) ×1 IMPLANT
CHLORAPREP W/TINT 26 (MISCELLANEOUS) ×1 IMPLANT
DERMABOND ADVANCED .7 DNX12 (GAUZE/BANDAGES/DRESSINGS) ×1 IMPLANT
DRAPE C-ARM XRAY 36X54 (DRAPES) ×1 IMPLANT
DRAPE SHEET LG 3/4 BI-LAMINATE (DRAPES) ×3 IMPLANT
DRAPE TABLE BACK 80X90 (DRAPES) ×1 IMPLANT
DRSG MEPILEX SACRM 8.7X9.8 (GAUZE/BANDAGES/DRESSINGS) ×1 IMPLANT
DRSG OPSITE POSTOP 4X8 (GAUZE/BANDAGES/DRESSINGS) ×1 IMPLANT
ELECT BLADE 4.0 EZ CLEAN MEGAD (MISCELLANEOUS) ×1
ELECT REM PT RETURN 9FT ADLT (ELECTROSURGICAL) ×1
ELECTRODE BLDE 4.0 EZ CLN MEGD (MISCELLANEOUS) ×1 IMPLANT
ELECTRODE REM PT RTRN 9FT ADLT (ELECTROSURGICAL) ×1 IMPLANT
GLOVE BIO SURGEON STRL SZ8 (GLOVE) ×1 IMPLANT
GLOVE BIOGEL PI IND STRL 8 (GLOVE) ×1 IMPLANT
GLOVE PI ORTHO PRO STRL 7.5 (GLOVE) ×2 IMPLANT
GLOVE PI ORTHO PRO STRL SZ8 (GLOVE) ×2 IMPLANT
GLOVE SURG SYN 7.5 E (GLOVE) ×1 IMPLANT
GLOVE SURG SYN 7.5 PF PI (GLOVE) ×1 IMPLANT
GOWN SRG XL LVL 3 NONREINFORCE (GOWNS) ×1 IMPLANT
GOWN STRL REUS W/ TWL LRG LVL3 (GOWN DISPOSABLE) ×1 IMPLANT
GOWN STRL REUS W/ TWL XL LVL3 (GOWN DISPOSABLE) ×1 IMPLANT
HANDLE YANKAUER SUCT OPEN TIP (MISCELLANEOUS) IMPLANT
HEAD CERAMIC FEMORAL 36MM (Head) IMPLANT
HOOD PEEL AWAY T7 (MISCELLANEOUS) ×2 IMPLANT
INSERT 0 DEGREE 36 (Miscellaneous) IMPLANT
IV NS 100ML SINGLE PACK (IV SOLUTION) ×1 IMPLANT
KIT PATIENT CARE HANA TABLE (KITS) ×1 IMPLANT
LIGHT WAVEGUIDE WIDE FLAT (MISCELLANEOUS) ×1 IMPLANT
MANIFOLD NEPTUNE II (INSTRUMENTS) ×1 IMPLANT
MARKER SKIN DUAL TIP RULER LAB (MISCELLANEOUS) ×1 IMPLANT
MAT ABSORB FLUID 56X50 GRAY (MISCELLANEOUS) ×1 IMPLANT
NDL SPNL 20GX3.5 QUINCKE YW (NEEDLE) ×1 IMPLANT
NEEDLE SPNL 20GX3.5 QUINCKE YW (NEEDLE) ×1 IMPLANT
NS IRRIG 500ML POUR BTL (IV SOLUTION) ×1 IMPLANT
PACK HIP COMPR (MISCELLANEOUS) ×1 IMPLANT
PAD ARMBOARD 7.5X6 YLW CONV (MISCELLANEOUS) ×1 IMPLANT
PENCIL SMOKE EVACUATOR (MISCELLANEOUS) ×1 IMPLANT
SCREW HEX LP 6.5X20 (Screw) IMPLANT
SCREW HEX LP 6.5X30 (Screw) IMPLANT
SHELL ACETAB TRIDENT 48 (Shell) IMPLANT
SLEEVE SCD COMPRESS KNEE MED (STOCKING) ×1 IMPLANT
SOLUTION IRRIG SURGIPHOR (IV SOLUTION) ×1 IMPLANT
STEM HIGH OFFSET SZ4 36X103 (Stem) IMPLANT
SURGIFLO W/THROMBIN 8M KIT (HEMOSTASIS) IMPLANT
SUT BONE WAX W31G (SUTURE) ×1 IMPLANT
SUT ETHIBOND 2 V 37 (SUTURE) ×1 IMPLANT
SUT SILK 0 30XBRD TIE 6 (SUTURE) ×1 IMPLANT
SUT STRATA 1 CT-1 DLB (SUTURE) ×1
SUT STRATAFIX 14 PDO 48 VLT (SUTURE) ×1 IMPLANT
SUT STRATAFIX PDO 1 14 VIOLET (SUTURE) ×1
SUT VIC AB 0 CT1 36 (SUTURE) ×1 IMPLANT
SUT VIC AB 2-0 CT2 27 (SUTURE) ×2 IMPLANT
SUTURE STRATA SPIR 4-0 18 (SUTURE) ×1 IMPLANT
SYR 30ML LL (SYRINGE) ×2 IMPLANT
TAPE MICROFOAM 4IN (TAPE) IMPLANT
TOWEL OR 17X26 4PK STRL BLUE (TOWEL DISPOSABLE) IMPLANT
TRAP FLUID SMOKE EVACUATOR (MISCELLANEOUS) ×1 IMPLANT
WAND WEREWOLF FASTSEAL 6.0 (MISCELLANEOUS) ×1 IMPLANT
WATER STERILE IRR 1000ML POUR (IV SOLUTION) ×1 IMPLANT

## 2023-05-09 NOTE — H&P (Signed)
History of Present Illness: Briana Snyder is an 57 y.o. female presents for evaluation of her left hip. Patient reports she has had severe left hip and lateral groin pain for over a year. She describes a aching throbbing pain which is worse with walking and bending over and getting up from a seated position. She reports that affects her activities on a daily basis with severe pain up to a 10 out of 10 at its worst causing her to limp. She has been treated with a hip injection last year which gave her only short-term relief and meloxicam which gives her some mild relief. Overall she feels that her function is limited on a daily basis and is affecting her ability to walk without pain. She is interested in a more permanent solution for her left hip arthritis and comes in today for evaluation. The patient denies fevers, chills, numbness, tingling, shortness of breath, chest pain, recent illness, or any trauma.  Patient is a non-smoker, nondiabetic with a BMI of 28  Past Medical History: Past Medical History:  Diagnosis Date  Anemia  Anxiety  Asthma without status asthmaticus  Colon cancer (CMS-HCC) 2016  GERD (gastroesophageal reflux disease)  Thyroid disease   Past Surgical History: Past Surgical History:  Procedure Laterality Date  right thyroidectomy 1987  BREAST SURGERY Left 2012  left breast bx-benign  laparascopic right hemi colectomy 02/2015  INGUINAL HERNIA REPAIR  varicose vein surgery   Past Family History: Family History  Problem Relation Age of Onset  Breast cancer Mother 15  Prostate cancer Father   Medications: Current Outpatient Medications  Medication Sig Dispense Refill  albuterol 90 mcg/actuation inhaler Inhale 2 inhalations into the lungs every 6 (six) hours as needed for Wheezing  amLODIPine (NORVASC) 2.5 MG tablet  azithromycin (ZITHROMAX) 250 MG tablet TAKE 1 TABLET BY MOUTH EVERY DAY FOR 10 DAYS FOR UPPER RESPIRATORY INFECTION  busPIRone (BUSPAR) 10 MG  tablet Take 10 mg by mouth 2 (two) times daily  butalbital-acetaminophen-caffeine (FIORICET) 50-325-40 mg tablet Take 1 tablet by mouth every 4 (four) hours as needed for Pain  fluticasone (FLONASE) 50 mcg/actuation nasal spray Place 2 sprays into both nostrils once daily  predniSONE (DELTASONE) 10 MG tablet Take 6 tabs (60mg ) by mouth followed by 5 tabs (50mg ), then 4 tabs (40mg ), then 3 tabs (30mg ), then 2 tabs (20mg ), then 1 tab (10mg ) 21 tablet 0  TRELEGY ELLIPTA 100-62.5-25 mcg inhaler Inhale 1 inhalation into the lungs once daily   No current facility-administered medications for this visit.   Allergies: No Known Allergies   Visit Vitals: There were no vitals filed for this visit.   Review of Systems:  A comprehensive 14 point ROS was performed, reviewed, and the pertinent orthopaedic findings are documented in the HPI.  Physical Exam: There is no height or weight on file to calculate BMI. General/Constitutional: No apparent distress: well-nourished and well developed. Lymphatic: No palpable adenopathy. Pulmonary exam: Lungs clear to auscultation bilaterally no wheezing rales or rhonchi Cardiac exam: Regular rate and rhythm no obvious murmurs rubs or gallops. Vascular: No edema, swelling or tenderness, except as noted in detailed exam. Integumentary: No impressive skin lesions present, except as noted in detailed exam. Neuro/Psych: Normal mood and affect, oriented to person, place and time. Musculoskeletal: Normal, except as noted in detailed exam and in HPI.  Left hip exam  SKIN: intact SWELLING: none WARMTH: no warmth TENDERNESS: none, Stinchfield Positive ROM: 0 degrees internal rotation and 30 degrees external rotation and pain with  internal rotation,; Hip Flexion 90 STRENGTH: limited by pain GAIT: antalgic with a mild limp STABILITY: stable to testing CREPITUS: yes LEG LENGTH DISCREPANCY: right longer by .5 cm NEUROLOGICAL EXAM: normal VASCULAR EXAM: normal LUMBAR  SPINE: tenderness: no straight leg raising sign: no motor exam: normal  The contralateral hip was examined for comparison and it showed: TENDERNESS: none ROM: normal and full STRENGTH: normal STABILITY: stable to testing  Hip Imaging :  I have reviewed AP pelvis and lateral hip X-rays (2 views) taken today in the office of the leftl hip which reveal severe degenerative changes with joint space narrowing, femoral deformity with flattening and elongation, osteophyte formation, subchondral cysts and sclerosis of the left hip. The right hip is noted to have moderate degenerative changes with inferior and medial joint space narrowing as well as superior acetabular sclerosis and some femoral head flattening. Multiple surgical vascular clips noted within the left medial thigh and some calcifications in the pelvic brim. No fractures or dislocations noted about either hip or the pelvis.   Assessment:  Encounter Diagnosis  Name Primary?  Left hip pain Yes   Left hip osteoarthritis  Plan: Briana Snyder is a 57 year old female presents with left hip bone on bone arthritis. Based upon the patient's continued symptoms and failure to respond to conservative treatment, I have recommended a left total hip replacement for this patient. A long discussion took place with the patient describing what a total joint replacement is and what the procedure would entail. A hip model, similar to the implants that will be used during the operation, was utilized to demonstrate the implants. Choices of implant manufactures were discussed and reviewed. The ability to secure the implant utilizing cement or cementless (press fit) fixation was discussed. Anterior and posterior exposures were discussed. For this patient an appropriate approach will be anterior.  The hospitalization and post-operative care and rehabilitation were also discussed. The use of perioperative antibiotics and DVT prophylaxis were discussed. The risk, benefits  and alternatives to a surgical intervention were discussed at length with the patient. The patient was also advised of risks related to the medical comorbidities and elevated body mass index (BMI). A lengthy discussion took place to review the most common complications including but not limited to: deep vein thrombosis, pulmonary embolus, heart attack, stroke, infection, wound breakdown, heterotopic ossification, dislocation, numbness, leg length in-equality, intraoperative fracture, damage to nerves, tendon,muscles, arteries or other blood vessels, death and other possible complications from anesthesia. The patient was told that we will take steps to minimize these risks by using sterile technique, antibiotics and DVT prophylaxis when appropriate and follow the patient postoperatively in the office setting to monitor progress. The possibility of recurrent pain, no improvement in pain and actual worsening of pain were also discussed with the patient. The risk of dislocation following total hip replacement was discussed and potential precautions to prevent dislocation were reviewed.   Patient asked about and confirms no history of any reactions to metal or metal allergy in the past.  The discharge plan of care focused on the patient going home following surgery. The patient was encouraged to make the necessary arrangements to have someone stay with them when they are discharged home.   The benefits of surgery were discussed with the patient including the potential for improving the patient's current clinical condition through operative intervention. Alternatives to surgical intervention including continued conservative management were also discussed in detail. All questions were answered to the satisfaction of the patient. The patient participated and  agreed to the plan of care as well as the use of the recommended implants for their total hip replacement surgery. An information packet was given to the patient  to review prior to surgery.   The patient received medical and pulmonary clearance for surgery. All questions answered patient agrees above plan I preparations for left anterior total replacement.  Portions of this record have been created using Scientist, clinical (histocompatibility and immunogenetics). Dictation errors have been sought, but may not have been identified and corrected.  Reinaldo Berber MD

## 2023-05-09 NOTE — TOC Initial Note (Addendum)
Transition of Care [facility]) - Initial/Assessment Note    Patient Details  Name: Briana Snyder MRN: [id number] Date of Birth: 04/15/1966  Transition of Care [facility] CM/SW Contact:    Marlowe Sax, RN Phone Number: 05/09/2023, 4:03 PM  Clinical Narrative:                 The patient is set up with [facility] for [facility] prior to surgery by surgeons office She does not  has a Front wheeled walker and 3 in 1 at home  [employer] to deliver to the bedside  Expected Discharge Plan: Home w Home Health Services Barriers to Discharge: Barriers Resolved   Patient Goals and CMS Choice            Expected Discharge Plan and Services   Discharge Planning Services: CM Consult   Living arrangements for the past 2 months: Single Family Home                           HH Arranged: PT, OT [facility] Agency: [facility]) Date HH Agency Contacted: 05/09/23 Time HH Agency Contacted: 1602 Representative spoke with at [facility] Agency: Adele Dan  Prior Living Arrangements/Services Living arrangements for the past 2 months: Single Family Home Lives with:: Self Patient language and need for interpreter reviewed:: Yes Do you feel safe going back to the place where you live?: Yes      Need for Family Participation in Patient Care: Yes (Comment) Care giver support system in place?: Yes (comment)   Criminal Activity/Legal Involvement Pertinent to Current Situation/Hospitalization: No - Comment as needed  Activities of Daily Living   ADL Screening (condition at time of admission) Independently performs ADLs?: Yes (appropriate for developmental age) Is the patient deaf or have difficulty hearing?: No Does the patient have difficulty seeing, even when wearing glasses/contacts?: No Does the patient have difficulty concentrating, remembering, or making decisions?: No  Permission Sought/Granted   Permission granted to share information with : Yes, Verbal Permission Granted               Emotional Assessment       Orientation: : Oriented to Self, Oriented to Place, Oriented to  Time, Oriented to Situation Alcohol / Substance Use: Not Applicable Psych Involvement: No (comment)  Admission diagnosis:  S/P total left hip arthroplasty [W09.811] Patient Active Problem List   Diagnosis Date Noted   S/P total left hip arthroplasty 05/09/2023   Seizure-like activity (HCC) 12/07/2019   Routine cervical smear 12/07/2019   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) 12/07/2019   Dysuria 12/07/2019   Essential hypertension 10/22/2019   Atopic dermatitis, mild 08/07/2017   Vaginal candidiasis 08/07/2017   Episodic cluster headache, not intractable 08/07/2017   Mild intermittent asthma 08/07/2017   Encounter for general adult medical examination with abnormal findings 08/07/2017   Abnormal Papanicolaou smear of cervix with positive human papilloma virus (HPV) test 08/07/2017   Allergic rhinitis 08/07/2017   Gastroesophageal reflux disease without esophagitis 08/07/2017   GAD (generalized anxiety disorder) 08/07/2017   Vitamin D deficiency 08/07/2017   Benign neoplasm of cecum 02/25/2015   PCP:  Carlean Jews, PA-C Pharmacy:   [facility] [address] [place] [address] Phone: [phone] Fax: [phone]     Social Drivers of Health (SDOH) Social History: SDOH Screenings   Food Insecurity: No Food Insecurity (05/09/2023)  Housing: Low Risk  (05/09/2023)  Transportation  Needs: No Transportation Needs (05/09/2023)  Utilities: Not At Risk (05/09/2023)  Alcohol Screen: Low Risk  (03/07/2023)  Depression (PHQ2-9): Low Risk  (03/07/2023)  Tobacco Use: Medium Risk (05/09/2023)   SDOH Interventions:     Readmission Risk Interventions     No data to display

## 2023-05-09 NOTE — Evaluation (Signed)
Physical Therapy Evaluation Patient Details Name: Briana Snyder MRN: [id number] DOB: 1965-07-20 Today's Date: 05/09/2023  History of Present Illness  Pt is a 57 y.o. female s/p L THA 05/09/23.  PMH includes anemia, anxiety, asthma, colon CA, thyroid disease, R thyroidectomy 1987, L breast sx 2012, inguinal hernia repair, laparascopic R hemi colectomy 02/2015, leukopenia, seizure-like activity, sleep apnea.  Clinical Impression  Prior to surgery, pt was independent with ambulation (limited d/t L hip pain); lives with her daughter in 2nd floor apt (1 level) with 18 steps with B railings to access home.  L hip pain 0/10 at rest beginning of session and 5/10 at rest end of session.  Did well tolerating LE ex's in bed with assist as needed.  Currently pt is SBA with bed mobility; CGA with transfers; and CGA to ambulate 80 feet with RW use.  Pt would currently benefit from skilled PT to address noted impairments and functional limitations (see below for any additional details).  Upon hospital discharge, pt would benefit from ongoing therapy.     If plan is discharge home, recommend the following: A little help with walking and/or transfers;A little help with bathing/dressing/bathroom;Assistance with cooking/housework;Assist for transportation;Help with stairs or ramp for entrance   Can travel by private vehicle    Yes    Equipment Recommendations Rolling walker (2 wheels)  Recommendations for Other Services       Functional Status Assessment Patient has had a recent decline in their functional status and demonstrates the ability to make significant improvements in function in a reasonable and predictable amount of time.     Precautions / Restrictions Precautions Precautions: Anterior Hip;Fall Precaution Booklet Issued: Yes (comment) Restrictions Weight Bearing Restrictions Per Provider Order: Yes LLE Weight Bearing Per Provider Order: Weight bearing as tolerated      Mobility   Bed Mobility Overal bed mobility: Needs Assistance Bed Mobility: Supine to Sit, Sit to Supine     Supine to sit: Supervision, HOB elevated Sit to supine: Supervision, HOB elevated   General bed mobility comments: Pt using UE's to assist with moving L LE; increased effort to perform on own    Transfers Overall transfer level: Needs assistance Equipment used: Rolling walker (2 wheels) Transfers: Sit to/from Stand Sit to Stand: Contact guard assist           General transfer comment: x2 trials from bed; vc's for UE/LE positioning    Ambulation/Gait Ambulation/Gait assistance: Contact guard assist Gait Distance (Feet): 80 Feet Assistive device: Rolling walker (2 wheels) Gait Pattern/deviations: Antalgic, Step-through pattern, Decreased step length - right, Decreased step length - left Gait velocity: decreased     General Gait Details: decreased stance time L LE; steady with RW use  Stairs            Wheelchair Mobility     Tilt Bed    Modified Rankin (Stroke Patients Only)       Balance Overall balance assessment: Needs assistance Sitting-balance support: No upper extremity supported, Feet supported Sitting balance-Leahy Scale: Normal Sitting balance - Comments: steady reaching outside BOS   Standing balance support: Bilateral upper extremity supported, During functional activity, Reliant on assistive device for balance Standing balance-Leahy Scale: Good Standing balance comment: steady ambulating with RW use                             Pertinent Vitals/Pain Pain Assessment Pain Assessment: 0-10 Pain Score: 5  Pain Location: L  hip pain Pain Descriptors / Indicators: Numbness, Sore Pain Intervention(s): Limited activity within patient's tolerance, Monitored during session, Repositioned, Patient requesting pain meds-RN notified, Other (comment) (Nurse bringing pt new ice pack) Vitals (HR and SpO2 on room air) stable and WFL throughout  treatment session.    Home Living Family/patient expects to be discharged to:: Private residence Living Arrangements: Children (Pt's daughter) Available Help at Discharge: Family;Available PRN/intermittently Type of Home: Apartment (2nd floor) Home Access: Stairs to enter Entrance Stairs-Rails: Right;Left;Can reach both Entrance Stairs-Number of Steps: 18   Home Layout: One level Home Equipment: Grab bars - tub/shower      Prior Function Prior Level of Function : Independent/Modified Independent             Mobility Comments: Independent with ambulation; no recent falls reported; working full time at the hospital ADLs Comments: INdependent     Extremity/Trunk Assessment   Upper Extremity Assessment Upper Extremity Assessment: Overall WFL for tasks assessed    Lower Extremity Assessment Lower Extremity Assessment: LLE deficits/detail (R LE WFL) LLE Deficits / Details: at least 3-/5 hip flexion (limited d/t L hip pain); at least 3/5 AROM knee flexion/extension and DF LLE: Unable to fully assess due to pain    Cervical / Trunk Assessment Cervical / Trunk Assessment: Normal  Communication   Communication Communication: No apparent difficulties Cueing Techniques: Verbal cues  Cognition Arousal: Alert Behavior During Therapy: WFL for tasks assessed/performed Overall Cognitive Status: Within Functional Limits for tasks assessed                                          General Comments General comments (skin integrity, edema, etc.): No drainage noted L hip dressing beginning/end of session.  Nursing cleared pt for participation in physical therapy.  Pt agreeable to PT session.  Pt's daughter present during session.    Exercises Total Joint Exercises Ankle Circles/Pumps: AROM, Strengthening, Both, 10 reps, Supine Quad Sets: AROM, Strengthening, Both, 10 reps, Supine Gluteal Sets: AROM, Strengthening, Both, 10 reps, Supine Towel Squeeze: AROM,  Strengthening, Both, 10 reps, Supine Short Arc Quad: AROM, Strengthening, Left, 10 reps, Supine Heel Slides: AAROM, Strengthening, Left, 10 reps, Supine Hip ABduction/ADduction: AAROM, Strengthening, Left, 10 reps, Supine   Assessment/Plan    PT Assessment Patient needs continued PT services  PT Problem List Decreased strength;Decreased activity tolerance;Decreased balance;Decreased mobility;Decreased knowledge of use of DME;Decreased knowledge of precautions;Decreased skin integrity;Pain       PT Treatment Interventions DME instruction;Gait training;Stair training;Functional mobility training;Therapeutic activities;Therapeutic exercise;Balance training;Patient/family education    PT Goals (Current goals can be found in the Care Plan section)  Acute Rehab PT Goals Patient Stated Goal: to improve pain and walking PT Goal Formulation: With patient Time For Goal Achievement: 05/23/23 Potential to Achieve Goals: Good    Frequency BID     Co-evaluation               AM-PAC PT "6 Clicks" Mobility  Outcome Measure Help needed turning from your back to your side while in a flat bed without using bedrails?: None Help needed moving from lying on your back to sitting on the side of a flat bed without using bedrails?: A Little Help needed moving to and from a bed to a chair (including a wheelchair)?: A Little Help needed standing up from a chair using your arms (e.g., wheelchair or bedside chair)?: A Little Help  needed to walk in hospital room?: A Little Help needed climbing 3-5 steps with a railing? : A Little 6 Click Score: 19    End of Session Equipment Utilized During Treatment: Gait belt Activity Tolerance: Patient tolerated treatment well Patient left: in bed;with call bell/phone within reach;with bed alarm set;with nursing/sitter in room;with SCD's reapplied Nurse Communication: Mobility status;Precautions;Patient requests pain meds;Weight bearing status PT Visit Diagnosis:  Other abnormalities of gait and mobility (R26.89);Muscle weakness (generalized) (M62.81);Pain Pain - Right/Left: Left Pain - part of body: Hip    Time: 9518-8416 PT Time Calculation (min) (ACUTE ONLY): 33 min   Charges:   PT Evaluation $PT Eval Low Complexity: 1 Low PT Treatments $Therapeutic Exercise: 8-22 mins $Therapeutic Activity: 8-22 mins PT General Charges $$ ACUTE PT VISIT: 1 Visit        Hendricks Limes, PT 05/09/23, 5:31 PM

## 2023-05-09 NOTE — Progress Notes (Addendum)
Patient is not able to walk the distance required to go the bathroom, or he/she is unable to safely negotiate stairs required to access the bathroom.  A 3in1 BSC will alleviate this problem  

## 2023-05-09 NOTE — Plan of Care (Signed)
continue

## 2023-05-09 NOTE — Anesthesia Procedure Notes (Signed)
Spinal  Patient location during procedure: OR Start time: 05/09/2023 9:02 AM End time: 05/09/2023 9:12 AM Reason for block: surgical anesthesia Staffing Performed: resident/CRNA  Anesthesiologist: Stephanie Coup, MD Resident/CRNA: Katherine Basset, CRNA Performed by: Katherine Basset, CRNA Authorized by: Stephanie Coup, MD   Preanesthetic Checklist Completed: patient identified, IV checked, site marked, risks and benefits discussed, surgical consent, monitors and equipment checked, pre-op evaluation and timeout performed Spinal Block Patient position: sitting Prep: ChloraPrep Patient monitoring: heart rate, continuous pulse ox and blood pressure Approach: midline Location: L3-4 Injection technique: single-shot Needle Needle type: Pencan  Needle gauge: 25 G Needle length: 9 cm Assessment Sensory level: T4 Events: CSF return Additional Notes IV functioning, monitors applied to pt. Expiration date of kit checked and confirmed to be in date. Sterile prep and drape, hand hygiene and sterile gloved used. Pt was positioned and spine was prepped in sterile fashion. Skin was anesthetized with lidocaine. Free flow of clear CSF obtained prior to injecting local anesthetic into CSF x 1 attempt. Spinal needle aspirated freely following injection. Needle was carefully withdrawn, and pt tolerated procedure well. Loss of motor and sensory on exam post injection.

## 2023-05-09 NOTE — Interval H&P Note (Signed)
Patient history and physical updated. Consent reviewed including risks, benefits, and alternatives to surgery. Patient agrees with above plan to proceed with left anterior total hip arthroplasty  

## 2023-05-09 NOTE — Transfer of Care (Signed)
Immediate Anesthesia Transfer of Care Note  Patient: Briana Snyder  Procedure(s) Performed: TOTAL HIP ARTHROPLASTY ANTERIOR APPROACH (Left: Hip)  Patient Location: PACU  Anesthesia Type:General and Spinal  Level of Consciousness: awake, alert , and oriented  Airway & Oxygen Therapy: Patient Spontanous Breathing  Post-op Assessment: Report given to RN and Post -op Vital signs reviewed and stable  Post vital signs: Reviewed and stable  Last Vitals:  Vitals Value Taken Time  BP 108/72 05/09/23 1110  Temp    Pulse 87 05/09/23 1113  Resp 18 05/09/23 1113  SpO2 94 % 05/09/23 1113  Vitals shown include unfiled device data.  Last Pain:  Vitals:   05/09/23 0829  TempSrc: Temporal  PainSc: 0-No pain         Complications: No notable events documented.

## 2023-05-09 NOTE — Op Note (Signed)
Patient Name: Briana Snyder  YQI:[id number]  Pre-Operative Diagnosis: Left hip Osteoarthritis  Post-Operative Diagnosis: (same)  Procedure: Left Total Hip Arthroplasty  Components/Implants: Cup: Trident Tritainum Clusterhole 48/D w/ x2 screws    Liner: Neutral X3 Poly 36/D  Stem: Insignia #4 High Offset  Head: Biolox ceramic 36 +77mm   Date of Surgery: 05/09/2023  Surgeon: Reinaldo Berber MD  Assistant: Amador Cunas PA (present and scrubbed throughout the case, critical for assistance with exposure, retraction, instrumentation, and closure)   Anesthesiologist: Lorette Ang  Anesthesia: Spinal   EBL: 200cc  IVF:600cc  Complications: None   Brief history: The patient is a 57 year old female with a history of osteoarthritis of the left hip with pain limiting their range of motion and activities of daily living, which has failed multiple attempts at conservative therapy.  The risks and benefits of total hip arthroplasty as definitive surgical treatment were discussed with the patient, who opted to proceed with the operation.  After outpatient medical clearance and optimization was completed the patient was admitted to [facility] for the procedure.  All preoperative films were reviewed and an appropriate surgical plan was made prior to surgery.   Description of procedure: The patient was brought to the operating room where laterality was confirmed by all those present to be the left side.  The patient was administered spinal anesthesia on a stretcher prior to being moved supine on the operating room table. Patient was given an intravenous dose of antibiotics for surgical prophylaxis and TXA.  All bony prominences and extremities were well padded and the patient was securely attached to the table boots, a perineal post was placed and the patient had a safety strap placed.  Surgical site was prepped with alcohol and chlorhexidine. The surgical site over the hip was and  draped in typical sterile fashion with multiple layers of adhesive and nonadhesive drapes.  The incision site was marked out with a sterile marker and care was taken to assess the position of the ASIS and ensure appropriate position for the incision.    A surgical timeout was then called with participation of all staff in the room the patient was then a confirmed again and laterality confirmed.  Incision was made over the anterior lateral aspect of the proximal thigh in line with the TFL.  Appropriate retractors were placed and all bleeding vessels were coagulated within the subcutaneous and fatty layers.  An incision was made in the TFL fascia in the interval was carefully identified.  The lateral ascending branches of the circumflex vessels were identified, cauterized and carefully dissected. The main vessels were then tied with a 0 silk hand tie.  Retractors were placed around the superior lateral and inferior medial aspects of the femoral neck and a capsulotomy was performed exposing the hip joint.  Retraction stitches were placed and the capsulotomy to assist with visualization.  Femoral neck cut was then made and the femoral head was extracted after placing the leg in traction.  Bone wax was then applied to the proximal cut surface of the femur and aqua mantis was used to address any bleeding around the femoral neck cut.  Retractors were then placed around the acetabulum to fully visualize the joint space, and the remaining labral tissue was removed and pulvinar was removed.   The acetabulum was then sequentially reamed up to the appropriate size in order to get good fit and fill for the acetabular component while under fluoroscopic guidance.  Acetabular component was then placed and  malleted into a secure fit while confirming position and abduction angle and anteversion utilizing fluoroscopy.  2 screws were then placed in the acetabular cup to assist in securing the cup in place. The cup was irrigated,   a real neutral liner was placed, impacted, and checked for stability. The femur traction was dropped and sequentially externally rotated while performing a release of the posterior and superomedial tissues off of the proximal femur to allow for mobility, care was taken to preserve the external rotators and piriformis attachments.  The remaining interval between the abductors and the capsule was dissected out and a retractor was placed over the superolateral aspect of the femur over the greater trochanter.  The leg was carefully brought down into extension and adducted to provide visualization of the proximal femur for broaching.  The femur was then sequentially broached up to an appropriate size which provided for good fill and stability to the femoral broach.  A trial neck and head were placed on the femoral broach and the leg was brought up for reduction.  The hip was reduced and manual check of stability was performed.  The hip was found to be stable in flexion internal rotation and extension external rotation.  Leg lengths were confirmed on fluoroscopy.   The hip was then dislocated the trial neck and head were removed.  The leg was then brought down into extension and adduction in the proximal femur was reexposed.  The broach trial was removed and the femur was irrigated with normal saline prior to the real femoral stem being implanted.  After the femoral stem was seated and shown to have good fit and fill the appropriate head was impacted the leg was brought up and reduced.  There was good range of motion with stability in flexion internal rotation and extension external rotation on testing.  Leg lengths were found to be appropriate on fluoroscopic evaluation at this time.  The hip was then irrigated with betdine based surgiphor solution and then saline solution.  The capsulotomy was repaired with Ethibond sutures.  A pericapsular and peritrochanteric cocktail with Exparel and bupivacaine was then injected  as well as the subcutaneous tissues. The fascia was closed with a #1 barbed running suture.  The deep tissues were closed with Vicryl sutures the subcutaneous tissues were closed with interrupted Vicryl sutures and a running barbed 4-0 suture.  The skin was then reinforced with Dermabond and a sterile dressing was placed.   The patient was awoken from anesthesia transferred off of the operating room table onto a hospital bed where examination of leg lengths found the leg lengths to be equal with a good distal pulse.  The patient was then transferred to the PACU in stable condition.

## 2023-05-09 NOTE — Anesthesia Preprocedure Evaluation (Signed)
Anesthesia Evaluation  Patient identified by MRN, date of birth, ID band Patient awake    Reviewed: Allergy & Precautions, NPO status , Patient's Chart, lab work & pertinent test results  History of Anesthesia Complications Negative for: history of anesthetic complications  Airway Mallampati: II  TM Distance: >3 FB Neck ROM: Full    Dental  (+) Teeth Intact, Dental Advidsory Given   Pulmonary neg pulmonary ROS, neg shortness of breath, asthma (last inhaler 1 week ago) , former smoker   Pulmonary exam normal        Cardiovascular hypertension, On Medications negative cardio ROS Normal cardiovascular exam     Neuro/Psych  PSYCHIATRIC DISORDERS Anxiety     negative neurological ROS  negative psych ROS   GI/Hepatic negative GI ROS, Neg liver ROS,GERD  Medicated and Controlled,,  Endo/Other  negative endocrine ROS    Renal/GU      Musculoskeletal   Abdominal   Peds  Hematology negative hematology ROS (+) Blood dyscrasia, anemia   Anesthesia Other Findings Past Medical History: No date: 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) No date: Allergic rhinitis No date: Anemia No date: Anxiety No date: Asthma No date: Atopic dermatitis, mild No date: B12 deficiency No date: Benign neoplasm of cecum No date: Cancer (HCC)     Comment:  T2,  No date: Dysuria No date: Episodic cluster headache, not intractable No date: Episodic cluster headache, not intractable No date: Gastroesophageal reflux disease without esophagitis No date: GERD (gastroesophageal reflux disease) No date: Headache No date: Hypertension No date: Leukopenia No date: Seizure-like activity (HCC) No date: Sleep apnea No date: Vitamin D deficiency  Past Surgical History: 2012: BREAST BIOPSY; Left     Comment:  FIBROADENOMA, 1.0 CM.  2012: BREAST LUMPECTOMY; Left 01/28/2015: COLONOSCOPY; N/A     Comment:  Procedure:  COLONOSCOPY;  Surgeon: Kieth Brightly,              MD;  Location: [facility] END[facility];  Service: Endoscopy;                Laterality: N/A; 08/2010: endometrial polyps     Comment:  resected 2000: HERNIA REPAIR     Comment:  inguinal  02/25/2015: LAPAROSCOPIC RIGHT HEMI COLECTOMY; Right     Comment:  Procedure: LAPAROSCOPIC RIGHT HEMI COLECTOMY;  Surgeon:               Kieth Brightly, MD;  Location: [facility] ORS;  Service:              General;  Laterality: Right; 1987: THYROIDECTOMY     Comment:  Right No date: VARICOSE VEIN SURGERY  BMI    Body Mass Index: 28.64 kg/m      Reproductive/Obstetrics negative OB ROS                             Anesthesia Physical Anesthesia Plan  ASA: 2  Anesthesia Plan: Spinal   Post-op Pain Management:    Induction:   PONV Risk Score and Plan: 2 and Ondansetron, Dexamethasone, Propofol infusion, TIVA and Midazolam  Airway Management Planned: Natural Airway and Nasal Cannula  Additional Equipment:   Intra-op Plan:   Post-operative Plan:   Informed Consent: I have reviewed the patients History and Physical, chart, labs and discussed the procedure including the risks, benefits and alternatives for the proposed anesthesia with the patient or authorized representative who has indicated his/her understanding and acceptance.  Dental Advisory Given  Plan Discussed with: Anesthesiologist, CRNA and Surgeon  Anesthesia Plan Comments: (Patient reports no bleeding problems and no anticoagulant use.  Plan for spinal with backup GA  Patient consented for risks of anesthesia including but not limited to:  - adverse reactions to medications - damage to eyes, teeth, lips or other oral mucosa - nerve damage due to positioning  - risk of bleeding, infection and or nerve damage from spinal that could lead to paralysis - risk of headache or failed spinal - damage to teeth, lips or other oral mucosa - sore throat  or hoarseness - damage to heart, brain, nerves, lungs, other parts of body or loss of life  Patient voiced understanding and assent.)       Anesthesia Quick Evaluation

## 2023-05-10 ENCOUNTER — Other Ambulatory Visit: Payer: Self-pay

## 2023-05-10 DIAGNOSIS — Z79899 Other long term (current) drug therapy: Secondary | ICD-10-CM | POA: Diagnosis not present

## 2023-05-10 DIAGNOSIS — I10 Essential (primary) hypertension: Secondary | ICD-10-CM | POA: Diagnosis not present

## 2023-05-10 DIAGNOSIS — Z85038 Personal history of other malignant neoplasm of large intestine: Secondary | ICD-10-CM | POA: Diagnosis not present

## 2023-05-10 DIAGNOSIS — M1612 Unilateral primary osteoarthritis, left hip: Secondary | ICD-10-CM | POA: Diagnosis not present

## 2023-05-10 DIAGNOSIS — J45909 Unspecified asthma, uncomplicated: Secondary | ICD-10-CM | POA: Diagnosis not present

## 2023-05-10 LAB — BASIC METABOLIC PANEL
Anion gap: 12 (ref 5–15)
BUN: 10 mg/dL (ref 6–20)
CO2: 22 mmol/L (ref 22–32)
Calcium: 8.2 mg/dL — ABNORMAL LOW (ref 8.9–10.3)
Chloride: 102 mmol/L (ref 98–111)
Creatinine, Ser: 1.12 mg/dL — ABNORMAL HIGH (ref 0.44–1.00)
GFR, Estimated: 57 mL/min — ABNORMAL LOW (ref >60)
Glucose, Bld: 128 mg/dL — ABNORMAL HIGH (ref 70–99)
Potassium: 3.5 mmol/L (ref 3.5–5.1)
Sodium: 136 mmol/L (ref 135–145)

## 2023-05-10 LAB — CBC
HCT: 31.6 % — ABNORMAL LOW (ref 36.0–46.0)
Hemoglobin: 10.5 g/dL — ABNORMAL LOW (ref 12.0–15.0)
MCH: 30.1 pg (ref 26.0–34.0)
MCHC: 33.2 g/dL (ref 30.0–36.0)
MCV: 90.5 fL (ref 80.0–100.0)
Platelets: 159 10*3/uL (ref 150–400)
RBC: 3.49 MIL/uL — ABNORMAL LOW (ref 3.87–5.11)
RDW: 15.1 % (ref 11.5–15.5)
WBC: 9.4 10*3/uL (ref 4.0–10.5)
nRBC: 0 % (ref 0.0–0.2)

## 2023-05-10 MED ORDER — AMLODIPINE BESYLATE 5 MG PO TABS
ORAL_TABLET | ORAL | Status: AC
  Filled 2023-05-10: qty 1

## 2023-05-10 MED ORDER — DOCUSATE SODIUM 100 MG PO CAPS
100.0000 mg | ORAL_CAPSULE | Freq: Two times a day (BID) | ORAL | 0 refills | Status: DC
  Filled 2023-05-10: qty 10, 5d supply, fill #0

## 2023-05-10 MED ORDER — KETOROLAC TROMETHAMINE 15 MG/ML IJ SOLN
INTRAMUSCULAR | Status: AC
  Filled 2023-05-10: qty 1

## 2023-05-10 MED ORDER — ACETAMINOPHEN 500 MG PO TABS
1000.0000 mg | ORAL_TABLET | Freq: Three times a day (TID) | ORAL | 0 refills | Status: AC
  Filled 2023-05-10: qty 30, 5d supply, fill #0

## 2023-05-10 MED ORDER — CEPHALEXIN 500 MG PO CAPS
500.0000 mg | ORAL_CAPSULE | Freq: Four times a day (QID) | ORAL | 0 refills | Status: AC
  Filled 2023-05-10: qty 12, 3d supply, fill #0

## 2023-05-10 MED ORDER — ACETAMINOPHEN 500 MG PO TABS
ORAL_TABLET | ORAL | Status: AC
  Filled 2023-05-10: qty 2

## 2023-05-10 MED ORDER — TRAMADOL HCL 50 MG PO TABS
50.0000 mg | ORAL_TABLET | Freq: Four times a day (QID) | ORAL | 0 refills | Status: DC | PRN
  Filled 2023-05-10: qty 30, 8d supply, fill #0

## 2023-05-10 MED ORDER — PANTOPRAZOLE SODIUM 40 MG PO TBEC
DELAYED_RELEASE_TABLET | ORAL | Status: AC
  Filled 2023-05-10: qty 1

## 2023-05-10 MED ORDER — CELECOXIB 200 MG PO CAPS
200.0000 mg | ORAL_CAPSULE | Freq: Every day | ORAL | 0 refills | Status: AC
  Filled 2023-05-10: qty 7, 7d supply, fill #0

## 2023-05-10 MED ORDER — SODIUM CHLORIDE 0.9 % IV BOLUS
500.0000 mL | Freq: Once | INTRAVENOUS | Status: AC
  Administered 2023-05-10: 500 mL via INTRAVENOUS

## 2023-05-10 MED ORDER — DOCUSATE SODIUM 100 MG PO CAPS
ORAL_CAPSULE | ORAL | Status: AC
Start: 2023-05-10 — End: ?
  Filled 2023-05-10: qty 1

## 2023-05-10 MED ORDER — ONDANSETRON HCL 4 MG PO TABS
4.0000 mg | ORAL_TABLET | Freq: Four times a day (QID) | ORAL | 0 refills | Status: DC | PRN
  Filled 2023-05-10: qty 20, 5d supply, fill #0

## 2023-05-10 MED ORDER — TRAMADOL HCL 50 MG PO TABS
ORAL_TABLET | ORAL | Status: AC
  Filled 2023-05-10: qty 1

## 2023-05-10 MED ORDER — ENOXAPARIN SODIUM 40 MG/0.4ML IJ SOSY
40.0000 mg | PREFILLED_SYRINGE | INTRAMUSCULAR | 0 refills | Status: DC
  Filled 2023-05-10: qty 5.6, 14d supply, fill #0

## 2023-05-10 MED ORDER — ENOXAPARIN SODIUM 40 MG/0.4ML IJ SOSY
PREFILLED_SYRINGE | INTRAMUSCULAR | Status: AC
  Filled 2023-05-10: qty 0.4

## 2023-05-10 NOTE — Progress Notes (Signed)
DISCHARGE NOTE:   Pt discharged with IV removed and dc instructions given. Pt voiced no further questions or concerns at this time. Pt sent home with both TED hose on and in place, as well as a sharps container for Lovenox injections. Pt wheeled down to medical mall entrance by staff and pt's daughter provided transportation.

## 2023-05-10 NOTE — Discharge Summary (Signed)
Physician Discharge Summary  Patient ID: Briana Snyder MRN: [id number] DOB/AGE: Mar 07, 1966 57 y.o.  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) 12/07/2019   Dysuria 12/07/2019   Essential hypertension 10/22/2019   Atopic dermatitis, mild 08/07/2017   Vaginal candidiasis 08/07/2017   Episodic cluster headache, not intractable 08/07/2017   Mild intermittent asthma 08/07/2017   Encounter for general adult medical examination with abnormal findings 08/07/2017   Abnormal Papanicolaou smear of cervix with positive human papilloma virus (HPV) test 08/07/2017   Allergic rhinitis 08/07/2017   Gastroesophageal reflux disease without esophagitis 08/07/2017   GAD (generalized anxiety disorder) 08/07/2017   Vitamin D deficiency 08/07/2017   Benign neoplasm of cecum 02/25/2015    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 female who was admitted 05/09/2023 with a diagnosis of S/P  total left hip arthroplasty and went to the operating room on 05/09/2023 and underwent the above named procedures.    Surgeries: Procedure(s): TOTAL HIP ARTHROPLASTY ANTERIOR APPROACH on 05/09/2023 Patient tolerated the surgery well. Taken to PACU where she was stabilized and then transferred to the orthopedic floor.  Started on Lovenox 40 mg q 24 hrs. TEDs and SCDs applied bilaterally. Heels elevated on bed. No evidence of DVT. Negative Homan. Physical therapy started on day #1 for gait training and transfer. OT started day #1 for ADL and assisted devices.  Patient's IV was d/c on day #1. Patient was able to safely and independently complete all PT goals. PT recommending discharge to home.    On post op day #1 patient was stable and ready for discharge to home with HHPT.  Implants: Cup: Trident Tritainum Clusterhole 48/D w/ x2 screws    Liner: Neutral X3 Poly 36/D  Stem: Insignia #4 High Offset  Head: Biolox ceramic 36 +73mm   She was given perioperative antibiotics:  Anti-infectives (From admission, onward)    Start     Dose/Rate Route Frequency Ordered Stop   05/10/23 0000  cephALEXin (KEFLEX) 500 MG capsule        500 mg Oral 4 times daily 05/10/23 0813 05/13/23 2359   05/09/23 1530  ceFAZolin (ANCEF) IVPB 2g/100 mL premix        2 g 200 mL/hr over 30 Minutes Intravenous Every 6 hours 05/09/23 1333 05/09/23 2242   05/09/23 0830  ceFAZolin (ANCEF) IVPB 2g/100 mL premix  2 g 200 mL/hr over 30 Minutes Intravenous On call to O.R. 05/09/23 8295 05/09/23 0935     .  She was given sequential compression devices, early ambulation, and Lovenox TEDS for DVT prophylaxis.  Patient was given 3 days of oral extend antibiotics, cephalexin due to a history of neutropenia.  She benefited maximally from the hospital stay and there were no complications.    Recent vital signs:  Vitals:   05/10/23 0538 05/10/23 0717  BP: 95/62 99/68  Pulse: 82 74  Resp: 16 16  Temp: 98 F (36.7 C) 98.5  F (36.9 C)  SpO2: 96% 99%    Recent laboratory studies:  Lab Results  Component Value Date   HGB 10.5 (L) 05/10/2023   HGB 12.0 05/02/2023   HGB 12.5 12/29/2022   Lab Results  Component Value Date   WBC 9.4 05/10/2023   PLT 159 05/10/2023   No results found for: "INR" Lab Results  Component Value Date   NA 136 05/10/2023   K 3.5 05/10/2023   CL 102 05/10/2023   CO2 22 05/10/2023   BUN 10 05/10/2023   CREATININE 1.12 (H) 05/10/2023   GLUCOSE 128 (H) 05/10/2023    Discharge Medications:   Allergies as of 05/10/2023   No Known Allergies      Medication List     STOP taking these medications    acetaminophen 650 MG CR tablet Commonly known as: TYLENOL Replaced by: acetaminophen 500 MG tablet   meloxicam 7.5 MG tablet Commonly known as: MOBIC       TAKE these medications    acetaminophen 500 MG tablet Commonly known as: TYLENOL Take 2 tablets (1,000 mg total) by mouth every 8 (eight) hours. Replaces: acetaminophen 650 MG CR tablet   albuterol (2.5 MG/3ML) 0.083% nebulizer solution Commonly known as: PROVENTIL Take 3 mLs (2.5 mg total) by nebulization every 6 (six) hours as needed for wheezing or shortness of breath.   albuterol 108 (90 Base) MCG/ACT inhaler Commonly known as: VENTOLIN HFA INHALE 1 PUFF INTO THE LUNGS EVERY 4 HOURS AS NEEDED   amLODipine 2.5 MG tablet Commonly known as: NORVASC Take 1 tablet (2.5 mg total) by mouth daily.   busPIRone 10 MG tablet Commonly known as: BUSPAR Take 1 tablet (10 mg total) by mouth 2 (two) times daily. What changed: when to take this   celecoxib 200 MG capsule Commonly known as: CeleBREX Take 1 capsule (200 mg total) by mouth daily for 7 days.   cephALEXin 500 MG capsule Commonly known as: KEFLEX Take 1 capsule (500 mg total) by mouth 4 (four) times daily for 3 days.   cyanocobalamin 1000 MCG tablet Commonly known as: VITAMIN B12 Take 1,000 mcg by mouth daily.   docusate sodium 100 MG  capsule Commonly known as: COLACE Take 1 capsule (100 mg total) by mouth 2 (two) times daily.   enoxaparin 40 MG/0.4ML injection Commonly known as: LOVENOX Inject 0.4 mLs (40 mg total) into the skin daily for 14 days. Start taking on: May 11, 2023   fluticasone 50 MCG/ACT nasal spray Commonly known as: FLONASE Place 1 spray into both nostrils as needed.   hydrocortisone 1 % ointment Apply 1 application topically 2 (two) times daily. What changed:  when to take this reasons to take this   ondansetron 4 MG tablet Commonly known as: ZOFRAN Take 1 tablet (4 mg total) by mouth every 6 (six) hours as needed for nausea.   rosuvastatin 5 MG tablet Commonly known as:  CRESTOR Take 1 tablet (5 mg total) by mouth twice a week.   traMADol 50 MG tablet Commonly known as: ULTRAM Take 1 tablet (50 mg total) by mouth every 6 (six) hours as needed for moderate pain (pain score 4-6).   Trelegy Ellipta 100-62.5-25 MCG/ACT Aepb Generic drug: Fluticasone-Umeclidin-Vilant Inhale 1 puff into the lungs daily.   triamcinolone ointment 0.5 % Commonly known as: KENALOG Apply 1 Application topically 2 (two) times daily. What changed:  when to take this reasons to take this   Vitamin D (Ergocalciferol) 1.25 MG (50000 UNIT) Caps capsule Commonly known as: DRISDOL Take 1 capsule (50,000 Units total) by mouth once a week.               Durable Medical Equipment  (From admission, onward)           Start     Ordered   05/09/23 1624  For home use only DME Bedside commode  Once       Question:  Patient needs a bedside commode to treat with the following condition  Answer:  Impaired mobility   05/09/23 1623   05/09/23 1624  For home use only DME Walker rolling  Once       Question Answer Comment  Walker: With 5 Inch Wheels   Patient needs a walker to treat with the following condition Impaired mobility      05/09/23 1623            Diagnostic Studies: DG HIP UNILAT WITH  PELVIS 2-3 VIEWS LEFT Result Date: 05/09/2023 CLINICAL DATA:  Hip replacement. EXAM: DG HIP (WITH OR WITHOUT PELVIS) 2-3V LEFT COMPARISON:  Radiographs 09/08/2021. FINDINGS: C-arm fluoroscopy was provided in the operating room without the presence of a radiologist.19.4 fluoroscopy time. 2.0 mGy air kerma. Three C-arm fluoroscopic images were obtained intraoperatively and are submitted for post operative interpretation. These images demonstrate interval left total hip arthroplasty with a screw fixed acetabular component. No evidence of complication. There are surgical clips in the left groin. Please see intraoperative findings for further detail. IMPRESSION: Intraoperative fluoroscopic guidance for left total hip arthroplasty. Electronically Signed   By: Carey Bullocks M.D.   On: 05/09/2023 14:37   DG C-Arm 1-60 Min-No Report Result Date: 05/09/2023 Fluoroscopy was utilized by the requesting physician.  No radiographic interpretation.    Disposition:      Follow-up Information     Evon Slack, PA-C Follow up in 2 week(s).   Specialties: Orthopedic Surgery, Emergency Medicine Contact information: [address] [place] [address] [phone]                  Signed: Patience Musca 05/10/2023, 8:15 AM

## 2023-05-10 NOTE — Discharge Instructions (Signed)
Instructions after Anterior Total Hip Replacement        Dr. Serita Butcher., M.D.      Dept. of [facility]  [place], [place]  [address]  Phone: [phone]   Fax: [phone]    DIET: Drink plenty of non-alcoholic fluids. Resume your normal diet. Include foods high in fiber.  ACTIVITY:  You may use crutches or a walker with weight-bearing as tolerated, unless instructed otherwise. You may be weaned off of the walker or crutches by your Physical Therapist.  Continue doing gentle exercises. Exercising will reduce the pain and swelling, increase motion, and prevent muscle weakness.   Please continue to use the TED compression stockings for 2 weeks. You may remove the stockings at night, but should reapply them in the morning. Do not drive or operate any equipment until instructed.  WOUND CARE:  Continue to use ice packs periodically to reduce pain and swelling. You may shower with honeycomb dressing 3 days after your surgery. Do not submerge incision site under water. Remove honeycomb dressing 7 days after surgery and allow dermabond to fall off on its own.   MEDICATIONS: You may resume your regular medications. Please take the pain medication as prescribed on the medication list. Do not take pain medication on an empty stomach. You have been given a prescription for a blood thinner to prevent blood clots. Please take the medication as instructed. (NOTE: After completing a 2 week course of Lovenox, take one Enteric-coated 81 mg aspirin twice a day for 3 additional weeks.) Pain medications and iron supplements can cause constipation. Use a stool softener (Senokot or Colace) on a daily basis and a laxative (dulcolax or miralax) as needed. Do not drive or drink alcoholic beverages when taking pain medications.  POSTOPERATIVE CONSTIPATION PROTOCOL Constipation - defined medically as fewer than three stools per week and  severe constipation as less than one stool per week.  One of the most common issues patients have following surgery is constipation.  Even if you have a regular bowel pattern at home, your normal regimen is likely to be disrupted due to multiple reasons following surgery.  Combination of anesthesia, postoperative narcotics, change in appetite and fluid intake all can affect your bowels.  In order to avoid complications following surgery, here are some recommendations in order to help you during your recovery period.  Colace (docusate) - Pick up an over-the-counter form of Colace or another stool softener and take twice a day as long as you are requiring postoperative pain medications.  Take with a full glass of water daily.  If you experience loose stools or diarrhea, hold the colace until you stool forms back up.  If your symptoms do not get better within 1 week or if they get worse, check with your doctor.  Dulcolax (bisacodyl) - Pick up over-the-counter and take as directed by the product packaging as needed to assist with the movement of your bowels.  Take with a full glass of water.  Use this product as needed if not relieved by Colace only.   MiraLax (polyethylene glycol) - Pick up over-the-counter to have on hand.  MiraLax is a solution that will increase the amount of water in your bowels to assist with bowel movements.  Take as directed and can mix with a glass of water, juice, soda, coffee, or tea.  Take if you go more than two days without a movement. Do not use MiraLax more than  once per day. Call your doctor if you are still constipated or irregular after using this medication for 7 days in a row.  If you continue to have problems with postoperative constipation, please contact the office for further assistance and recommendations.  If you experience "the worst abdominal pain ever" or develop nausea or vomiting, please contact the office immediatly for further recommendations for  treatment.   CALL THE OFFICE FOR: Temperature above 101 degrees Excessive bleeding or drainage on the dressing. Excessive swelling, coldness, or paleness of the toes. Persistent nausea and vomiting.  FOLLOW-UP:  You should have an appointment to return to the office in 2 weeks after surgery. Arrangements have been made for continuation of Physical Therapy (either home therapy or outpatient therapy).

## 2023-05-10 NOTE — Progress Notes (Signed)
   Subjective: 1 Day Post-Op Procedure(s) (LRB): TOTAL HIP ARTHROPLASTY ANTERIOR APPROACH (Left) Patient reports pain as mild.   Patient is well, and has had no acute complaints or problems Denies any CP, SOB, ABD pain. We will continue therapy today.  Plan is to go Home after hospital stay.  Objective: Vital signs in last 24 hours: Temp:  [98 F (36.7 C)-99 F (37.2 C)] 98.5 F (36.9 C) (12/17 0717) Pulse Rate:  [67-87] 74 (12/17 0717) Resp:  [11-24] 16 (12/17 0717) BP: (95-135)/(62-99) 99/68 (12/17 0717) SpO2:  [91 %-100 %] 99 % (12/17 0717) Weight:  [81.7 kg] 81.7 kg (12/16 0829)  Intake/Output from previous day: 12/16 0701 - 12/17 0700 In: 1500 [P.O.:200; I.V.:900; IV Piggyback:400] Out: 750 [Urine:550; Blood:200] Intake/Output this shift: No intake/output data recorded.  Recent Labs    05/10/23 0651  HGB 10.5*   Recent Labs    05/10/23 0651  WBC 9.4  RBC 3.49*  HCT 31.6*  PLT 159   Recent Labs    05/10/23 0651  NA 136  K 3.5  CL 102  CO2 22  BUN 10  CREATININE 1.12*  GLUCOSE 128*  CALCIUM 8.2*   No results for input(s): "LABPT", "INR" in the last 72 hours.  EXAM General - Patient is Alert, Appropriate, and Oriented Extremity - Neurovascular intact Sensation intact distally Intact pulses distally Dorsiflexion/Plantar flexion intact No cellulitis present Compartment soft Dressing - dressing C/D/I and no drainage Motor Function - intact, moving foot and toes well on exam.   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)    Allergic rhinitis    Anemia    Anxiety    Asthma    Atopic dermatitis, mild    B12 deficiency    Benign neoplasm of cecum    Cancer (HCC)    T2,    Dysuria    Episodic cluster headache, not intractable    Episodic cluster headache, not intractable    Gastroesophageal reflux disease without esophagitis    GERD (gastroesophageal reflux disease)    Headache     Hypertension    Leukopenia    Seizure-like activity (HCC)    Sleep apnea    Vitamin D deficiency     Assessment/Plan:   1 Day Post-Op Procedure(s) (LRB): TOTAL HIP ARTHROPLASTY ANTERIOR APPROACH (Left) Principal Problem:   S/P total left hip arthroplasty  Estimated body mass index is 28.64 kg/m as calculated from the following:   Height as of this encounter: 5' 6.5" (1.689 m).   Weight as of this encounter: 81.7 kg. Advance diet Up with therapy Pain well controlled Labs and VSS CM to assist with discharge to home with HHPT today  DVT Prophylaxis - Lovenox, TED hose, and SCDs Weight-Bearing as tolerated to left leg   T. Cranston Neighbor, PA-C [facility] 05/10/2023, 8:09 AM

## 2023-05-10 NOTE — Progress Notes (Signed)
Pts BP 100/68 after NS bolus.Pt asymptomatic and would like to go home. Thayer Ohm PA notified, per Thayer Ohm pt can d/c home.

## 2023-05-10 NOTE — Plan of Care (Signed)
  Problem: Activity: Goal: Ability to avoid complications of mobility impairment will improve Outcome: Progressing Goal: Ability to tolerate increased activity will improve Outcome: Progressing   Problem: Clinical Measurements: Goal: Postoperative complications will be avoided or minimized Outcome: Progressing   Problem: Pain Management: Goal: Pain level will decrease with appropriate interventions Outcome: Progressing   

## 2023-05-10 NOTE — Progress Notes (Signed)
Physical Therapy Treatment Patient Details Name: Briana Snyder MRN: [id number] DOB: [birth year] Today's Date: 05/10/2023   History of Present Illness Pt is a 57 y.o. female s/p L THA 05/09/23.  PMH includes anemia, anxiety, asthma, colon CA, thyroid disease, R thyroidectomy 1987, L breast sx 2012, inguinal hernia repair, laparascopic R hemi colectomy 02/2015, leukopenia, seizure-like activity, sleep apnea.    PT Comments  Pt resting in bed upon PT arrival; agreeable to therapy.  L hip pain 0/10 at rest beginning of session and 4/10 at rest end of session (nurse updated on pt's pain status and overall status).  During session pt modified independent with bed mobility; modified independent with transfers; SBA with ambulation 200 feet with RW use; and SBA navigating 20 steps with UE support.  Reviewed home safety, fall prevention, and safe car transfers: pt verbalizing appropriate understanding.  Pt reports no questions/concerns for home discharge.  Pt's home walker adjusted for proper fit.  Plan for discharge home today.   If plan is discharge home, recommend the following: A little help with walking and/or transfers;A little help with bathing/dressing/bathroom;Assistance with cooking/housework;Assist for transportation;Help with stairs or ramp for entrance   Can travel by private vehicle      Yes  Equipment Recommendations  Rolling walker (2 wheels)    Recommendations for Other Services       Precautions / Restrictions Precautions Precautions: Anterior Hip;Fall Precaution Booklet Issued: Yes (comment) Restrictions Weight Bearing Restrictions Per Provider Order: Yes LLE Weight Bearing Per Provider Order: Weight bearing as tolerated     Mobility  Bed Mobility Overal bed mobility: Needs Assistance Bed Mobility: Supine to Sit, Sit to Supine     Supine to sit: Modified independent (Device/Increase time) Sit to supine: Modified independent (Device/Increase time)   General bed  mobility comments: bed flat; mild increased effort to perform on own; pt using UE's to assist with moving L LE back into bed (sit to supine)    Transfers Overall transfer level: Needs assistance Equipment used: Rolling walker (2 wheels) Transfers: Sit to/from Stand Sit to Stand: Modified independent (Device/Increase time)           General transfer comment: steady transfer from bed; good UE/LE positioning    Ambulation/Gait Ambulation/Gait assistance: Supervision Gait Distance (Feet): 200 Feet Assistive device: Rolling walker (2 wheels) Gait Pattern/deviations: Antalgic, Step-through pattern, Decreased step length - right, Decreased step length - left Gait velocity: decreased     General Gait Details: mild decreased stance time L LE; steady with RW use   Stairs Stairs: Yes Stairs assistance: Supervision Stair Management: Step to pattern Number of Stairs: 20 General stair comments: ascended/descended 16 steps forward with single UE support on L railing; ascended/descended 4 steps sidestepping with L railing; steady safe stairs training   Wheelchair Mobility     Tilt Bed    Modified Rankin (Stroke Patients Only)       Balance Overall balance assessment: Needs assistance Sitting-balance support: No upper extremity supported, Feet supported Sitting balance-Leahy Scale: Normal Sitting balance - Comments: steady reaching outside BOS   Standing balance support: No upper extremity supported Standing balance-Leahy Scale: Good Standing balance comment: steady reaching within BOS                            Cognition Arousal: Alert Behavior During Therapy: WFL for tasks assessed/performed Overall Cognitive Status: Within Functional Limits for tasks assessed  Exercises Total Joint Exercises Ankle Circles/Pumps: AROM, Strengthening, Both, 10 reps, Supine Quad Sets: AROM, Strengthening, Both, 10  reps, Supine Gluteal Sets: AROM, Strengthening, Both, 10 reps, Supine Towel Squeeze: AROM, Strengthening, Both, 10 reps, Supine Short Arc Quad: AROM, Strengthening, Left, 10 reps, Supine Heel Slides: AROM, Strengthening, Left, 10 reps, Supine Hip ABduction/ADduction: AROM, Strengthening, Left, 10 reps, Supine Straight Leg Raises: AAROM, Strengthening, Left, 10 reps, Supine Long Arc Quad: AROM, Strengthening, Left, 10 reps, Seated General Exercises - Lower Extremity Hip Flexion/Marching: AROM, Strengthening, Both, 10 reps, Seated    General Comments General comments (skin integrity, edema, etc.): No drainage noted L hip dressing beginning/end of session.  Nursing cleared pt for participation in physical therapy.  Pt agreeable to PT session.  Pt's daughter present during session.      Pertinent Vitals/Pain Pain Assessment Pain Assessment: 0-10 Pain Score: 4  Pain Location: L hip pain Pain Descriptors / Indicators: Sore Pain Intervention(s): Limited activity within patient's tolerance, Monitored during session, Premedicated before session, Repositioned, Ice applied Vitals (HR and SpO2 on room air) stable and WFL throughout treatment session.    Home Living                          Prior Function            PT Goals (current goals can now be found in the care plan section) Acute Rehab PT Goals Patient Stated Goal: to improve pain and walking PT Goal Formulation: With patient Time For Goal Achievement: 05/23/23 Potential to Achieve Goals: Good Progress towards PT goals: Progressing toward goals    Frequency    BID      PT Plan      Co-evaluation              AM-PAC PT "6 Clicks" Mobility   Outcome Measure  Help needed turning from your back to your side while in a flat bed without using bedrails?: None Help needed moving from lying on your back to sitting on the side of a flat bed without using bedrails?: None Help needed moving to and from a bed to  a chair (including a wheelchair)?: None Help needed standing up from a chair using your arms (e.g., wheelchair or bedside chair)?: None Help needed to walk in hospital room?: A Little Help needed climbing 3-5 steps with a railing? : A Little 6 Click Score: 22    End of Session Equipment Utilized During Treatment: Gait belt Activity Tolerance: Patient tolerated treatment well Patient left: in bed;with call bell/phone within reach;with bed alarm set;with SCD's reapplied;with family/visitor present Nurse Communication: Mobility status;Precautions;Patient requests pain meds;Weight bearing status PT Visit Diagnosis: Other abnormalities of gait and mobility (R26.89);Muscle weakness (generalized) (M62.81);Pain Pain - Right/Left: Left Pain - part of body: Hip     Time: 2841-3244 PT Time Calculation (min) (ACUTE ONLY): 34 min  Charges:    $Gait Training: 8-22 mins $Therapeutic Exercise: 8-22 mins PT General Charges $$ ACUTE PT VISIT: 1 Visit                     Hendricks Limes, PT 05/10/23, 1:14 PM

## 2023-05-10 NOTE — Progress Notes (Signed)
Pt BP is 99/70, HR is 75 notified PA and received orders to give 500 cc bolus as well as hold morning dose of amlodipine. Pt having no symptoms of hypotension.

## 2023-05-10 NOTE — Anesthesia Postprocedure Evaluation (Signed)
Anesthesia Post Note  Patient: Briana Snyder  Procedure(s) Performed: TOTAL HIP ARTHROPLASTY ANTERIOR APPROACH (Left: Hip)  Patient location during evaluation: Nursing Unit Anesthesia Type: Spinal Level of consciousness: awake Pain management: pain level controlled Respiratory status: spontaneous breathing Cardiovascular status: stable Postop Assessment: no headache Anesthetic complications: no   No notable events documented.   Last Vitals:  Vitals:   05/10/23 0538 05/10/23 0717  BP: 95/62 99/68  Pulse: 82 74  Resp: 16 16  Temp: 36.7 C 36.9 C  SpO2: 96% 99%    Last Pain:  Vitals:   05/10/23 0717  TempSrc: Oral  PainSc:                  Jaye Beagle

## 2023-05-11 DIAGNOSIS — D649 Anemia, unspecified: Secondary | ICD-10-CM | POA: Diagnosis not present

## 2023-05-11 DIAGNOSIS — J4520 Mild intermittent asthma, uncomplicated: Secondary | ICD-10-CM | POA: Diagnosis not present

## 2023-05-11 DIAGNOSIS — I10 Essential (primary) hypertension: Secondary | ICD-10-CM | POA: Diagnosis not present

## 2023-05-11 DIAGNOSIS — Z7901 Long term (current) use of anticoagulants: Secondary | ICD-10-CM | POA: Diagnosis not present

## 2023-05-11 DIAGNOSIS — Z85038 Personal history of other malignant neoplasm of large intestine: Secondary | ICD-10-CM | POA: Diagnosis not present

## 2023-05-11 DIAGNOSIS — F10250 Alcohol dependence with alcohol-induced psychotic disorder with delusions: Secondary | ICD-10-CM | POA: Diagnosis not present

## 2023-05-11 DIAGNOSIS — Z7951 Long term (current) use of inhaled steroids: Secondary | ICD-10-CM | POA: Diagnosis not present

## 2023-05-11 DIAGNOSIS — Z79899 Other long term (current) drug therapy: Secondary | ICD-10-CM | POA: Diagnosis not present

## 2023-05-11 DIAGNOSIS — F411 Generalized anxiety disorder: Secondary | ICD-10-CM | POA: Diagnosis not present

## 2023-05-11 DIAGNOSIS — Z471 Aftercare following joint replacement surgery: Secondary | ICD-10-CM | POA: Diagnosis not present

## 2023-05-13 DIAGNOSIS — Z7901 Long term (current) use of anticoagulants: Secondary | ICD-10-CM | POA: Diagnosis not present

## 2023-05-13 DIAGNOSIS — D649 Anemia, unspecified: Secondary | ICD-10-CM | POA: Diagnosis not present

## 2023-05-13 DIAGNOSIS — F411 Generalized anxiety disorder: Secondary | ICD-10-CM | POA: Diagnosis not present

## 2023-05-13 DIAGNOSIS — Z85038 Personal history of other malignant neoplasm of large intestine: Secondary | ICD-10-CM | POA: Diagnosis not present

## 2023-05-13 DIAGNOSIS — Z79899 Other long term (current) drug therapy: Secondary | ICD-10-CM | POA: Diagnosis not present

## 2023-05-13 DIAGNOSIS — J4520 Mild intermittent asthma, uncomplicated: Secondary | ICD-10-CM | POA: Diagnosis not present

## 2023-05-13 DIAGNOSIS — F10250 Alcohol dependence with alcohol-induced psychotic disorder with delusions: Secondary | ICD-10-CM | POA: Diagnosis not present

## 2023-05-13 DIAGNOSIS — I10 Essential (primary) hypertension: Secondary | ICD-10-CM | POA: Diagnosis not present

## 2023-05-13 DIAGNOSIS — Z7951 Long term (current) use of inhaled steroids: Secondary | ICD-10-CM | POA: Diagnosis not present

## 2023-05-13 DIAGNOSIS — Z471 Aftercare following joint replacement surgery: Secondary | ICD-10-CM | POA: Diagnosis not present

## 2023-05-15 NOTE — Procedures (Signed)
[facility] [address] [place], [address]    Complete Pulmonary Function Testing Interpretation:  FINDINGS:  The forced vital capacity is normal FEV1 is mildly decreased.  FEV1 FVC ratio is mildly decreased.  Postbronchodilator no significant change in FEV1 is noted.  Total lung capacity is normal.  Residual volume is normal.  Residual volume total lung capacity ratio is increased.  FRC is normal.  The DLCO is mildly decreased.  IMPRESSION:  This pulmonary function study is consistent with mild obstructive lung disease clinical correlation is recommended.  Briana Pax, MD [facility] Pulmonary Critical Care Medicine Sleep Medicine

## 2023-05-16 DIAGNOSIS — Z85038 Personal history of other malignant neoplasm of large intestine: Secondary | ICD-10-CM | POA: Diagnosis not present

## 2023-05-16 DIAGNOSIS — I10 Essential (primary) hypertension: Secondary | ICD-10-CM | POA: Diagnosis not present

## 2023-05-16 DIAGNOSIS — J4520 Mild intermittent asthma, uncomplicated: Secondary | ICD-10-CM | POA: Diagnosis not present

## 2023-05-16 DIAGNOSIS — F411 Generalized anxiety disorder: Secondary | ICD-10-CM | POA: Diagnosis not present

## 2023-05-16 DIAGNOSIS — Z7951 Long term (current) use of inhaled steroids: Secondary | ICD-10-CM | POA: Diagnosis not present

## 2023-05-16 DIAGNOSIS — Z79899 Other long term (current) drug therapy: Secondary | ICD-10-CM | POA: Diagnosis not present

## 2023-05-16 DIAGNOSIS — Z7901 Long term (current) use of anticoagulants: Secondary | ICD-10-CM | POA: Diagnosis not present

## 2023-05-16 DIAGNOSIS — F10250 Alcohol dependence with alcohol-induced psychotic disorder with delusions: Secondary | ICD-10-CM | POA: Diagnosis not present

## 2023-05-16 DIAGNOSIS — Z471 Aftercare following joint replacement surgery: Secondary | ICD-10-CM | POA: Diagnosis not present

## 2023-05-16 DIAGNOSIS — D649 Anemia, unspecified: Secondary | ICD-10-CM | POA: Diagnosis not present

## 2023-05-17 ENCOUNTER — Other Ambulatory Visit: Payer: Self-pay

## 2023-05-17 ENCOUNTER — Other Ambulatory Visit (HOSPITAL_COMMUNITY): Payer: Self-pay | Admitting: Orthopedic Surgery

## 2023-05-19 ENCOUNTER — Other Ambulatory Visit: Payer: Self-pay

## 2023-05-19 ENCOUNTER — Encounter: Payer: Self-pay | Admitting: Pharmacist

## 2023-05-19 DIAGNOSIS — Z7951 Long term (current) use of inhaled steroids: Secondary | ICD-10-CM | POA: Diagnosis not present

## 2023-05-19 DIAGNOSIS — Z85038 Personal history of other malignant neoplasm of large intestine: Secondary | ICD-10-CM | POA: Diagnosis not present

## 2023-05-19 DIAGNOSIS — Z7901 Long term (current) use of anticoagulants: Secondary | ICD-10-CM | POA: Diagnosis not present

## 2023-05-19 DIAGNOSIS — I10 Essential (primary) hypertension: Secondary | ICD-10-CM | POA: Diagnosis not present

## 2023-05-19 DIAGNOSIS — F411 Generalized anxiety disorder: Secondary | ICD-10-CM | POA: Diagnosis not present

## 2023-05-19 DIAGNOSIS — Z471 Aftercare following joint replacement surgery: Secondary | ICD-10-CM | POA: Diagnosis not present

## 2023-05-19 DIAGNOSIS — D649 Anemia, unspecified: Secondary | ICD-10-CM | POA: Diagnosis not present

## 2023-05-19 DIAGNOSIS — J4520 Mild intermittent asthma, uncomplicated: Secondary | ICD-10-CM | POA: Diagnosis not present

## 2023-05-19 DIAGNOSIS — F10250 Alcohol dependence with alcohol-induced psychotic disorder with delusions: Secondary | ICD-10-CM | POA: Diagnosis not present

## 2023-05-19 DIAGNOSIS — Z79899 Other long term (current) drug therapy: Secondary | ICD-10-CM | POA: Diagnosis not present

## 2023-05-23 ENCOUNTER — Other Ambulatory Visit: Payer: Self-pay

## 2023-05-23 DIAGNOSIS — I10 Essential (primary) hypertension: Secondary | ICD-10-CM | POA: Diagnosis not present

## 2023-05-23 DIAGNOSIS — D649 Anemia, unspecified: Secondary | ICD-10-CM | POA: Diagnosis not present

## 2023-05-23 DIAGNOSIS — J4520 Mild intermittent asthma, uncomplicated: Secondary | ICD-10-CM | POA: Diagnosis not present

## 2023-05-23 DIAGNOSIS — Z7901 Long term (current) use of anticoagulants: Secondary | ICD-10-CM | POA: Diagnosis not present

## 2023-05-23 DIAGNOSIS — F411 Generalized anxiety disorder: Secondary | ICD-10-CM | POA: Diagnosis not present

## 2023-05-23 DIAGNOSIS — Z79899 Other long term (current) drug therapy: Secondary | ICD-10-CM | POA: Diagnosis not present

## 2023-05-23 DIAGNOSIS — F10250 Alcohol dependence with alcohol-induced psychotic disorder with delusions: Secondary | ICD-10-CM | POA: Diagnosis not present

## 2023-05-23 DIAGNOSIS — Z471 Aftercare following joint replacement surgery: Secondary | ICD-10-CM | POA: Diagnosis not present

## 2023-05-23 DIAGNOSIS — Z7951 Long term (current) use of inhaled steroids: Secondary | ICD-10-CM | POA: Diagnosis not present

## 2023-05-23 DIAGNOSIS — Z85038 Personal history of other malignant neoplasm of large intestine: Secondary | ICD-10-CM | POA: Diagnosis not present

## 2023-05-24 ENCOUNTER — Other Ambulatory Visit: Payer: Self-pay

## 2023-05-24 MED ORDER — TRAMADOL HCL 50 MG PO TABS
ORAL_TABLET | ORAL | 0 refills | Status: DC
  Filled 2023-05-24: qty 30, 8d supply, fill #0

## 2023-05-24 MED ORDER — ASPIRIN 81 MG PO TBEC
DELAYED_RELEASE_TABLET | ORAL | 0 refills | Status: DC
  Filled 2023-05-24: qty 42, 21d supply, fill #0

## 2023-05-24 NOTE — Progress Notes (Signed)
 Chief Complaint: Chief Complaint  Patient presents with  . Post Operative Visit     Left direct anterior THA 05/09/23 by Dr. Lorelle Garrison Briana Snyder is a 57 y.o. female who presents today status post left anterior total hip arthroplasty by Dr. Lorelle on 05/09/2023.  Patient doing well.  Pain and swelling improving.  No warmth redness or drainage.  Dressing remains intact.  She is completed Lovenox  today and will start baby aspirin .  She has been taking tramadol  but recently ran out.  She is walking well with a walker.  She denies any calf pain.  Overall doing well and pleased    Past Medical History: Past Medical History:  Diagnosis Date  . Anemia   . Anxiety   . Asthma without status asthmaticus (HHS-HCC)   . Colon cancer (CMS/HHS-HCC) 2016  . GERD (gastroesophageal reflux disease)   . Thyroid  disease     Past Surgical History: Past Surgical History:  Procedure Laterality Date  . right thyroidectomy  1987  . BREAST SURGERY Left 2012   left breast bx-benign  . laparascopic right hemi colectomy  02/2015  . INGUINAL HERNIA REPAIR    . varicose vein surgery      Past Family History: Family History  Problem Relation Age of Onset  . Breast cancer Mother 81  . Prostate cancer Father     Medications: Current Outpatient Medications  Medication Sig Dispense Refill  . albuterol  90 mcg/actuation inhaler Inhale 2 inhalations into the lungs every 6 (six) hours as needed for Wheezing    . amLODIPine  (NORVASC ) 2.5 MG tablet     . azithromycin  (ZITHROMAX ) 250 MG tablet TAKE 1 TABLET BY MOUTH EVERY DAY FOR 10 DAYS FOR UPPER RESPIRATORY INFECTION    . busPIRone  (BUSPAR ) 10 MG tablet Take 10 mg by mouth 2 (two) times daily    . butalbital -acetaminophen -caffeine  (FIORICET) 50-325-40 mg tablet Take 1 tablet by mouth every 4 (four) hours as needed for Pain    . fluticasone  (FLONASE ) 50 mcg/actuation nasal spray Place 2 sprays into both nostrils once daily    . meloxicam  (MOBIC ) 7.5  MG tablet Take 7.5 mg by mouth once daily    . predniSONE  (DELTASONE ) 10 MG tablet Take 6 tabs (60mg ) by mouth followed by 5 tabs (50mg ), then 4 tabs (40mg ), then 3 tabs (30mg ), then 2 tabs (20mg ), then 1 tab (10mg ) 21 tablet 0  . rosuvastatin  (CRESTOR ) 5 MG tablet Take 5 mg by mouth    . TRELEGY ELLIPTA  100-62.5-25 mcg inhaler Inhale 1 inhalation into the lungs once daily    . triamcinolone  0.5 % ointment Apply 1 Application topically 2 (two) times daily     No current facility-administered medications for this visit.    Allergies: No Known Allergies   Review of Systems:  A comprehensive 14 point ROS was performed, reviewed by me today, and the pertinent orthopaedic findings are documented in the HPI.   Exam: BP 112/66   Ht 167.6 cm (5' 6)   Wt 81.9 kg (180 lb 9.6 oz)   BMI 29.15 kg/m  General/Constitutional: The patient appears to be well-nourished, well-developed, and in no acute distress. Neuro/Psych: Normal mood and affect, oriented to person, place and time. Eyes: Non-icteric.  Pupils are equal, round, and reactive to light, and exhibit synchronous movement. ENT: Unremarkable. Lymphatic: No palpable adenopathy. Respiratory: Non-labored breathing Cardiovascular: No edema, swelling or tenderness, except as noted in detailed exam. Integumentary: No impressive skin lesions present, except as noted  in detailed exam. Musculoskeletal: Unremarkable, except as noted in detailed exam.  General: Well developed, well nourished 57 y.o. female in no apparent distress.  Normal affect.  Normal communication.  Patient answers questions appropriately.  Slow gait with minimal antalgic component. Patient ambulates with walker.  Left Lower Extremity: Examination of the left hip reveals the incision to be intact. There is no erythema, warmth, or drainage. The patient had minimal pain with hip internal and external rotation. 4/5 strength with hip abduction and adduction.  The ankle and knee had  full active range of motion with no pain.  There was less than 2 second capillary refill. The patient had a negative straight leg raise.  The sensation was intact to light touch.  The patient has a negative Homan's test.  There is good skin warmth.    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  call for any redness, swelling, warmth, fevers, or for any urgent changes in their health.  2. Patient will continue with physical therapy exercises.  3. Patient will follow up in 4 weeks for xray's of the operative hip.  4. We discussed antibiotic prophylaxis prior to dental procedures  5.  Start aspirin  81 mg twice daily for 3 weeks  This note was generated in part with voice recognition software and I apologize for any typographical errors that were not detected and corrected.   Debby Lonni Amber MPA-C

## 2023-05-26 ENCOUNTER — Other Ambulatory Visit (HOSPITAL_COMMUNITY): Payer: Self-pay

## 2023-05-26 MED ORDER — ENOXAPARIN SODIUM 40 MG/0.4ML IJ SOSY
40.0000 mg | PREFILLED_SYRINGE | INTRAMUSCULAR | 0 refills | Status: DC
  Filled 2023-05-26 – 2023-05-27 (×2): qty 5.6, 14d supply, fill #0

## 2023-05-27 ENCOUNTER — Other Ambulatory Visit: Payer: Self-pay

## 2023-05-27 ENCOUNTER — Other Ambulatory Visit (HOSPITAL_BASED_OUTPATIENT_CLINIC_OR_DEPARTMENT_OTHER): Payer: Self-pay

## 2023-05-27 ENCOUNTER — Other Ambulatory Visit (HOSPITAL_COMMUNITY): Payer: Self-pay

## 2023-05-29 DIAGNOSIS — G4733 Obstructive sleep apnea (adult) (pediatric): Secondary | ICD-10-CM | POA: Diagnosis not present

## 2023-05-30 ENCOUNTER — Other Ambulatory Visit: Payer: Self-pay

## 2023-06-01 ENCOUNTER — Other Ambulatory Visit: Payer: Self-pay

## 2023-06-22 ENCOUNTER — Other Ambulatory Visit: Payer: Self-pay

## 2023-07-04 ENCOUNTER — Ambulatory Visit: Payer: Self-pay | Admitting: Physician Assistant

## 2023-07-15 ENCOUNTER — Other Ambulatory Visit: Payer: Self-pay

## 2023-07-15 ENCOUNTER — Other Ambulatory Visit: Payer: Self-pay | Admitting: Physician Assistant

## 2023-07-15 DIAGNOSIS — J449 Chronic obstructive pulmonary disease, unspecified: Secondary | ICD-10-CM

## 2023-07-17 ENCOUNTER — Other Ambulatory Visit: Payer: Self-pay

## 2023-07-17 ENCOUNTER — Other Ambulatory Visit: Payer: Self-pay | Admitting: Physician Assistant

## 2023-07-17 DIAGNOSIS — J449 Chronic obstructive pulmonary disease, unspecified: Secondary | ICD-10-CM

## 2023-07-18 ENCOUNTER — Other Ambulatory Visit: Payer: Self-pay

## 2023-07-18 MED FILL — Fluticasone-Umeclidinium-Vilanterol AEPB 100-62.5-25 MCG/ACT: RESPIRATORY_TRACT | 30 days supply | Qty: 60 | Fill #0 | Status: CN

## 2023-07-18 MED FILL — Fluticasone-Umeclidinium-Vilanterol AEPB 100-62.5-25 MCG/ACT: RESPIRATORY_TRACT | 30 days supply | Qty: 60 | Fill #0 | Status: AC

## 2023-08-11 ENCOUNTER — Other Ambulatory Visit: Payer: Self-pay

## 2023-08-11 MED FILL — Fluticasone-Umeclidinium-Vilanterol AEPB 100-62.5-25 MCG/ACT: RESPIRATORY_TRACT | 30 days supply | Qty: 60 | Fill #1 | Status: CN

## 2023-08-17 ENCOUNTER — Other Ambulatory Visit: Payer: Self-pay

## 2023-08-17 MED FILL — Fluticasone-Umeclidinium-Vilanterol AEPB 100-62.5-25 MCG/ACT: RESPIRATORY_TRACT | 30 days supply | Qty: 60 | Fill #1 | Status: CN

## 2023-09-22 ENCOUNTER — Other Ambulatory Visit: Payer: Self-pay

## 2023-09-22 ENCOUNTER — Other Ambulatory Visit: Payer: Self-pay | Admitting: Physician Assistant

## 2023-09-22 DIAGNOSIS — I10 Essential (primary) hypertension: Secondary | ICD-10-CM

## 2023-09-22 MED ORDER — AMLODIPINE BESYLATE 2.5 MG PO TABS
2.5000 mg | ORAL_TABLET | Freq: Every day | ORAL | 1 refills | Status: DC
  Filled 2023-09-22: qty 90, 90d supply, fill #0
  Filled 2024-01-18: qty 90, 90d supply, fill #1

## 2023-09-22 MED FILL — Fluticasone-Umeclidinium-Vilanterol AEPB 100-62.5-25 MCG/ACT: RESPIRATORY_TRACT | 30 days supply | Qty: 60 | Fill #1 | Status: CN

## 2023-09-26 ENCOUNTER — Other Ambulatory Visit: Payer: Self-pay

## 2023-09-26 DIAGNOSIS — J449 Chronic obstructive pulmonary disease, unspecified: Secondary | ICD-10-CM

## 2023-09-26 NOTE — Telephone Encounter (Signed)
 Pt advised samples for trelegy ready for pickup

## 2023-10-05 ENCOUNTER — Other Ambulatory Visit: Payer: Self-pay

## 2023-10-05 MED FILL — Fluticasone-Umeclidinium-Vilanterol AEPB 100-62.5-25 MCG/ACT: RESPIRATORY_TRACT | 30 days supply | Qty: 60 | Fill #1 | Status: CN

## 2023-10-06 ENCOUNTER — Other Ambulatory Visit: Payer: Self-pay

## 2023-10-06 MED FILL — Fluticasone-Umeclidinium-Vilanterol AEPB 100-62.5-25 MCG/ACT: RESPIRATORY_TRACT | 30 days supply | Qty: 60 | Fill #1 | Status: CN

## 2023-10-07 ENCOUNTER — Other Ambulatory Visit: Payer: Self-pay | Admitting: Physician Assistant

## 2023-10-07 ENCOUNTER — Other Ambulatory Visit (HOSPITAL_BASED_OUTPATIENT_CLINIC_OR_DEPARTMENT_OTHER): Payer: Self-pay

## 2023-10-07 ENCOUNTER — Other Ambulatory Visit: Payer: Self-pay

## 2023-10-07 ENCOUNTER — Telehealth: Payer: Self-pay

## 2023-10-07 DIAGNOSIS — J4489 Other specified chronic obstructive pulmonary disease: Secondary | ICD-10-CM

## 2023-10-07 MED ORDER — TRELEGY ELLIPTA 100-62.5-25 MCG/ACT IN AEPB
1.0000 | INHALATION_SPRAY | Freq: Every day | RESPIRATORY_TRACT | 11 refills | Status: AC
  Filled 2023-10-07 – 2023-10-11 (×2): qty 60, 30d supply, fill #0
  Filled 2023-11-04: qty 60, 30d supply, fill #1
  Filled 2023-12-09: qty 60, 30d supply, fill #2
  Filled 2024-01-18: qty 60, 30d supply, fill #3
  Filled 2024-02-17: qty 60, 30d supply, fill #4
  Filled 2024-03-23: qty 60, 30d supply, fill #5
  Filled 2024-04-12 – 2024-04-14 (×2): qty 60, 30d supply, fill #6
  Filled 2024-05-14: qty 60, 30d supply, fill #7
  Filled 2024-06-08 (×2): qty 60, 30d supply, fill #8

## 2023-10-07 MED ORDER — BREZTRI AEROSPHERE 160-9-4.8 MCG/ACT IN AERO
2.0000 | INHALATION_SPRAY | Freq: Two times a day (BID) | RESPIRATORY_TRACT | 3 refills | Status: DC
  Filled 2023-10-07: qty 10.7, 30d supply, fill #0

## 2023-10-07 MED FILL — Fluticasone-Umeclidinium-Vilanterol AEPB 100-62.5-25 MCG/ACT: RESPIRATORY_TRACT | 30 days supply | Qty: 60 | Fill #1 | Status: CN

## 2023-10-07 NOTE — Telephone Encounter (Signed)
 Spoke with pt about trelegy not covered as per pt she don't like Breztri advised her that we can send message to phar that pt like to continue trelegy

## 2023-10-10 ENCOUNTER — Other Ambulatory Visit: Payer: Self-pay

## 2023-10-11 ENCOUNTER — Other Ambulatory Visit: Payer: Self-pay

## 2023-10-18 ENCOUNTER — Other Ambulatory Visit: Payer: Self-pay

## 2023-10-26 ENCOUNTER — Ambulatory Visit (INDEPENDENT_AMBULATORY_CARE_PROVIDER_SITE_OTHER): Admitting: Nurse Practitioner

## 2023-10-26 ENCOUNTER — Encounter: Payer: Self-pay | Admitting: Nurse Practitioner

## 2023-10-26 ENCOUNTER — Other Ambulatory Visit: Payer: Self-pay

## 2023-10-26 VITALS — BP 130/88 | HR 66 | Temp 97.7°F | Resp 16 | Ht 66.0 in | Wt 192.8 lb

## 2023-10-26 DIAGNOSIS — E782 Mixed hyperlipidemia: Secondary | ICD-10-CM | POA: Diagnosis not present

## 2023-10-26 DIAGNOSIS — R42 Dizziness and giddiness: Secondary | ICD-10-CM

## 2023-10-26 DIAGNOSIS — R220 Localized swelling, mass and lump, head: Secondary | ICD-10-CM

## 2023-10-26 DIAGNOSIS — F411 Generalized anxiety disorder: Secondary | ICD-10-CM

## 2023-10-26 MED ORDER — ROSUVASTATIN CALCIUM 5 MG PO TABS
5.0000 mg | ORAL_TABLET | ORAL | 3 refills | Status: AC
Start: 2023-10-27 — End: ?
  Filled 2023-10-26: qty 30, fill #0
  Filled 2023-12-21: qty 24, 84d supply, fill #0
  Filled 2024-03-27: qty 24, 84d supply, fill #1
  Filled 2024-04-12: qty 24, 84d supply, fill #2

## 2023-10-26 MED ORDER — BUSPIRONE HCL 10 MG PO TABS
10.0000 mg | ORAL_TABLET | Freq: Two times a day (BID) | ORAL | 1 refills | Status: AC
  Filled 2023-10-26: qty 60, 30d supply, fill #0
  Filled 2023-11-23: qty 60, 30d supply, fill #1
  Filled 2024-01-18: qty 60, 30d supply, fill #2
  Filled 2024-04-12: qty 60, 30d supply, fill #3
  Filled 2024-05-09: qty 60, 30d supply, fill #4
  Filled 2024-06-12: qty 60, 30d supply, fill #5

## 2023-10-26 NOTE — Progress Notes (Signed)
 [facility] [address] [place], [place] [address]  Internal MEDICINE  Office Visit Note  Patient Name: Briana Snyder  [id number]  [id number]  Date of Service: 10/26/2023  Chief Complaint  Patient presents with   Acute Visit    Bump on head.      HPI Briana Snyder presents for an acute sick visit for episodes of dizziness and a bump on the back of her head.  --onset was about a month ago. There is a shallow bump on the left side of the back of the head that came up when some hair was pulled out a couple of months ago. The area feels kind of numb. She has feels of vibrations and then episodes of dizziness multiple times per day.     Current Medication:  Outpatient Encounter Medications as of 10/26/2023  Medication Sig   acetaminophen  (TYLENOL ) 500 MG tablet Take 2 tablets (1,000 mg total) by mouth every 8 (eight) hours.   albuterol  (PROVENTIL ) (2.5 MG/3ML) 0.083% nebulizer solution Take 3 mLs (2.5 mg total) by nebulization every 6 (six) hours as needed for wheezing or shortness of breath.   albuterol  (VENTOLIN  HFA) 108 (90 Base) MCG/ACT inhaler INHALE 1 PUFF INTO THE LUNGS EVERY 4 HOURS AS NEEDED   amLODipine  (NORVASC ) 2.5 MG tablet Take 1 tablet (2.5 mg total) by mouth daily.   aspirin  EC (ASPIRIN  81) 81 MG tablet Take 1 tablet (81 mg total) by mouth 2 (two) times daily for 21 days   cyanocobalamin  (VITAMIN B12) 1000 MCG tablet Take 1,000 mcg by mouth daily.   docusate sodium  (COLACE) 100 MG capsule Take 1 capsule (100 mg total) by mouth 2 (two) times daily.   ergocalciferol  (VITAMIN D2) 1.25 MG (50000 UT) capsule Take 1 capsule (50,000 Units total) by mouth once a week.   fluticasone  (FLONASE ) 50 MCG/ACT nasal spray Place 1 spray into both nostrils as needed.   Fluticasone -Umeclidin-Vilant (TRELEGY ELLIPTA ) 100-62.5-25 MCG/ACT AEPB Inhale 1 puff into the lungs daily.   hydrocortisone  1 % ointment Apply 1 application topically 2 (two) times daily. (Patient taking  differently: Apply 1 application  topically 2 (two) times daily as needed for itching.)   ondansetron  (ZOFRAN ) 4 MG tablet Take 1 tablet (4 mg total) by mouth every 6 (six) hours as needed for nausea.   traMADol  (ULTRAM ) 50 MG tablet Take 1 tablet (50 mg total) by mouth every 6 (six) hours as needed for moderate pain (pain score 4-6).   traMADol  (ULTRAM ) 50 MG tablet Take 1 tablet (50 mg total) by mouth every 6 (six) hours as needed for Pain   triamcinolone  ointment (KENALOG ) 0.5 % Apply 1 Application topically 2 (two) times daily. (Patient taking differently: Apply 1 Application topically 2 (two) times daily as needed (irritation).)   [DISCONTINUED] busPIRone  (BUSPAR ) 10 MG tablet Take 1 tablet (10 mg total) by mouth 2 (two) times daily. (Patient taking differently: Take 10 mg by mouth daily.)   [DISCONTINUED] rosuvastatin  (CRESTOR ) 5 MG tablet Take 1 tablet (5 mg total) by mouth twice a week.   busPIRone  (BUSPAR ) 10 MG tablet Take 1 tablet (10 mg total) by mouth 2 (two) times daily.   enoxaparin  (LOVENOX ) 40 MG/0.4ML injection Inject 0.4 mLs (40 mg total) into the skin daily for 14 days.   rosuvastatin  (CRESTOR ) 5 MG tablet Take 1 tablet (5 mg total) by mouth twice a week.   No facility-administered encounter medications on file as of 10/26/2023.      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)    Allergic rhinitis    Anemia    Anxiety    Asthma    Atopic dermatitis, mild    B12 deficiency    Benign neoplasm of cecum    Cancer (HCC)    T2,    Dysuria    Episodic cluster headache, not intractable    Episodic cluster headache, not intractable    Gastroesophageal reflux disease without esophagitis    GERD (gastroesophageal reflux disease)    Headache    Hypertension    Leukopenia    Seizure-like activity (HCC)    Sleep apnea    Vitamin D  deficiency      Vital Signs: BP 130/88   Pulse 66   Temp  97.7 F (36.5 C)   Resp 16   Ht 5' 6 (1.676 m)   Wt 192 lb 12.8 oz (87.5 kg)   LMP 02/08/2015   SpO2 96%   BMI 31.12 kg/m    Review of Systems  Constitutional: Negative.   HENT: Negative.    Respiratory: Negative.  Negative for cough, chest tightness, shortness of breath and wheezing.   Cardiovascular: Negative.  Negative for chest pain and palpitations.  Gastrointestinal: Negative.   Genitourinary: Negative.   Musculoskeletal: Negative.   Skin:        Lump on left side of back of her scalp  Neurological:  Positive for dizziness. Negative for headaches.  Psychiatric/Behavioral:  Negative for self-injury, sleep disturbance and suicidal ideas. The patient is nervous/anxious.     Physical Exam Vitals reviewed.  Constitutional:      General: She is not in acute distress.    Appearance: Normal appearance. She is obese. She is not ill-appearing.  HENT:     Head: Normocephalic and atraumatic.  Eyes:     Pupils: Pupils are equal, round, and reactive to light.  Cardiovascular:     Rate and Rhythm: Normal rate and regular rhythm.  Pulmonary:     Effort: Pulmonary effort is normal. No respiratory distress.  Skin:    Capillary Refill: Capillary refill takes less than 2 seconds.     Findings: Lesion (left side of the back of her scalp) present.  Neurological:     Mental Status: She is alert and oriented to person, place, and time.  Psychiatric:        Mood and Affect: Mood normal.        Behavior: Behavior normal.      Assessment/Plan: 1. Dizziness of unknown etiology (Primary) CT head ordered  - CT HEAD WO CONTRAST ( ); Future  2. Lump of scalp CT head ordered  - CT HEAD WO CONTRAST ( ); Future  3. Mixed hyperlipidemia Continue rosuvastatin  as prescribed  - rosuvastatin  (CRESTOR ) 5 MG tablet; Take 1 tablet (5 mg total) by mouth 2 (two) times a week.  Dispense: 30 tablet; Refill: 3  4. GAD (generalized anxiety disorder) Continue buspirone  as prescribed.  -  busPIRone  (BUSPAR ) 10 MG tablet; Take 1 tablet (10 mg total) by mouth 2 (two) times daily.  Dispense: 180 tablet; Refill: 1   General Counseling: Briana Snyder verbalizes understanding of the findings of todays visit and agrees with plan of treatment. I have discussed any further diagnostic evaluation that may be needed or ordered today. We also reviewed her medications today. she has been encouraged to call the office with any questions or concerns that should arise related to todays visit.  Counseling:    Orders Placed This Encounter  Procedures   CT HEAD WO CONTRAST ( )    Meds ordered this encounter  Medications   rosuvastatin  (CRESTOR ) 5 MG tablet    Sig: Take 1 tablet (5 mg total) by mouth twice a week.    Dispense:  30 tablet    Refill:  3   busPIRone  (BUSPAR ) 10 MG tablet    Sig: Take 1 tablet (10 mg total) by mouth 2 (two) times daily.    Dispense:  180 tablet    Refill:  1    Return for f/u with lauren for CT head results in a few weeks .  [place] Controlled Substance Database was reviewed by me for overdose risk score (ORS)  Time spent:30 Minutes Time spent with patient included reviewing progress notes, labs, imaging studies, and discussing plan for follow up.   This patient was seen by Mardy Maxin, FNP-C in collaboration with Dr. Sigrid Bathe as a part of collaborative care agreement.  Maan Zarcone R. Maxin, MSN, FNP-C Internal Medicine

## 2023-10-31 ENCOUNTER — Telehealth: Payer: Self-pay | Admitting: Physician Assistant

## 2023-10-31 ENCOUNTER — Ambulatory Visit
Admission: RE | Admit: 2023-10-31 | Discharge: 2023-10-31 | Disposition: A | Discharge status: Home / Self Care | Source: Ambulatory Visit | Attending: Nurse Practitioner | Admitting: Nurse Practitioner

## 2023-10-31 DIAGNOSIS — R42 Dizziness and giddiness: Secondary | ICD-10-CM | POA: Diagnosis not present

## 2023-10-31 DIAGNOSIS — R220 Localized swelling, mass and lump, head: Secondary | ICD-10-CM | POA: Insufficient documentation

## 2023-10-31 NOTE — Telephone Encounter (Signed)
 Left another vm & sent message to schedule follow up for head CT-Toni

## 2023-11-03 ENCOUNTER — Telehealth: Payer: Self-pay | Admitting: Physician Assistant

## 2023-11-03 NOTE — Telephone Encounter (Signed)
 Left 3rd vm to schedule appointment to review CT results. Patient not returning calls or replying to [facility]

## 2023-11-20 ENCOUNTER — Encounter: Payer: Self-pay | Admitting: Nurse Practitioner

## 2023-11-23 ENCOUNTER — Other Ambulatory Visit: Payer: Self-pay

## 2023-12-09 ENCOUNTER — Ambulatory Visit (INDEPENDENT_AMBULATORY_CARE_PROVIDER_SITE_OTHER): Admitting: Physician Assistant

## 2023-12-09 ENCOUNTER — Other Ambulatory Visit: Payer: Self-pay

## 2023-12-09 ENCOUNTER — Encounter: Payer: Self-pay | Admitting: Physician Assistant

## 2023-12-09 VITALS — BP 106/60 | HR 75 | Temp 98.3°F | Resp 16 | Ht 66.0 in | Wt 192.0 lb

## 2023-12-09 DIAGNOSIS — I10 Essential (primary) hypertension: Secondary | ICD-10-CM

## 2023-12-09 DIAGNOSIS — E559 Vitamin D deficiency, unspecified: Secondary | ICD-10-CM

## 2023-12-09 DIAGNOSIS — E782 Mixed hyperlipidemia: Secondary | ICD-10-CM

## 2023-12-09 DIAGNOSIS — J323 Chronic sphenoidal sinusitis: Secondary | ICD-10-CM

## 2023-12-09 DIAGNOSIS — R946 Abnormal results of thyroid function studies: Secondary | ICD-10-CM

## 2023-12-09 DIAGNOSIS — E538 Deficiency of other specified B group vitamins: Secondary | ICD-10-CM

## 2023-12-09 DIAGNOSIS — R5383 Other fatigue: Secondary | ICD-10-CM

## 2023-12-09 MED ORDER — AZITHROMYCIN 250 MG PO TABS
ORAL_TABLET | ORAL | 0 refills | Status: AC
  Filled 2023-12-09: qty 6, 5d supply, fill #0

## 2023-12-09 NOTE — Progress Notes (Signed)
 [facility] [address] [place], [place] [address]  Internal MEDICINE  Office Visit Note  Patient Name: Briana Snyder  [id number]  [id number]  Date of Service: 12/09/2023  Chief Complaint  Patient presents with   Follow-up    Review CT   Gastroesophageal Reflux   Hypertension   Quality Metric Gaps    TDAP, Pneumonia and Shingles vaccines    HPI Pt is here for routine follow up to review CT results -CT head showing normal appearance of brain and no scalp soft tissue abnormality. Does report chronic sphenoid sinusitis -does report congestion and some sinus pressure/headaches. Will treat, but if not improving may need ENT given chronic findings -will order labs -advised to monitor BP as it is on the lower side in office and if dropping could cause dizziness. She reports dizziness has improved since last visit when CT ordered, however she will monitor for this  Current Medication: Outpatient Encounter Medications as of 12/09/2023  Medication Sig   acetaminophen  (TYLENOL ) 500 MG tablet Take 2 tablets (1,000 mg total) by mouth every 8 (eight) hours.   albuterol  (PROVENTIL ) (2.5 MG/3ML) 0.083% nebulizer solution Take 3 mLs (2.5 mg total) by nebulization every 6 (six) hours as needed for wheezing or shortness of breath.   albuterol  (VENTOLIN  HFA) 108 (90 Base) MCG/ACT inhaler INHALE 1 PUFF INTO THE LUNGS EVERY 4 HOURS AS NEEDED   amLODipine  (NORVASC ) 2.5 MG tablet Take 1 tablet (2.5 mg total) by mouth daily.   aspirin  EC (ASPIRIN  81) 81 MG tablet Take 1 tablet (81 mg total) by mouth 2 (two) times daily for 21 days   azithromycin  (ZITHROMAX ) 250 MG tablet Take 2 tablets on day 1, then 1 tablet daily on days 2 through 5   busPIRone  (BUSPAR ) 10 MG tablet Take 1 tablet (10 mg total) by mouth 2 (two) times daily.   cyanocobalamin  (VITAMIN B12) 1000 MCG tablet Take 1,000 mcg by mouth daily.   docusate sodium  (COLACE) 100 MG capsule Take 1 capsule (100 mg total) by mouth 2  (two) times daily.   enoxaparin  (LOVENOX ) 40 MG/0.4ML injection Inject 0.4 mLs (40 mg total) into the skin daily for 14 days.   ergocalciferol  (VITAMIN D2) 1.25 MG (50000 UT) capsule Take 1 capsule (50,000 Units total) by mouth once a week.   fluticasone  (FLONASE ) 50 MCG/ACT nasal spray Place 1 spray into both nostrils as needed.   Fluticasone -Umeclidin-Vilant (TRELEGY ELLIPTA ) 100-62.5-25 MCG/ACT AEPB Inhale 1 puff into the lungs daily.   hydrocortisone  1 % ointment Apply 1 application topically 2 (two) times daily. (Patient taking differently: Apply 1 application  topically 2 (two) times daily as needed for itching.)   ondansetron  (ZOFRAN ) 4 MG tablet Take 1 tablet (4 mg total) by mouth every 6 (six) hours as needed for nausea.   rosuvastatin  (CRESTOR ) 5 MG tablet Take 1 tablet (5 mg total) by mouth 2 (two) times a week.   traMADol  (ULTRAM ) 50 MG tablet Take 1 tablet (50 mg total) by mouth every 6 (six) hours as needed for moderate pain (pain score 4-6).   traMADol  (ULTRAM ) 50 MG tablet Take 1 tablet (50 mg total) by mouth every 6 (six) hours as needed for Pain   triamcinolone  ointment (KENALOG ) 0.5 % Apply 1 Application topically 2 (two) times daily. (Patient taking differently: Apply 1 Application topically 2 (two) times daily as needed (irritation).)   No facility-administered encounter medications on file as of 12/09/2023.    Surgical History: Past Surgical History:  Procedure  Laterality Date   BREAST BIOPSY Left 2012   FIBROADENOMA, 1.0 CM.    BREAST LUMPECTOMY Left 2012   COLONOSCOPY N/A 01/28/2015   Procedure: COLONOSCOPY;  Surgeon: Louanne KANDICE Muse, MD;  Location: [facility];  Service: Endoscopy;  Laterality: N/A;   endometrial polyps  08/2010   resected   HERNIA REPAIR  2000   inguinal    LAPAROSCOPIC RIGHT HEMI COLECTOMY Right 02/25/2015   Procedure: LAPAROSCOPIC RIGHT HEMI COLECTOMY;  Surgeon: Louanne KANDICE Muse, MD;  Location: [facility] ORS;  Service: General;  Laterality:  Right;   THYROIDECTOMY  1987   Right   TOTAL HIP ARTHROPLASTY Left 05/09/2023   Procedure: TOTAL HIP ARTHROPLASTY ANTERIOR APPROACH;  Surgeon: Lorelle Hussar, MD;  Location: [facility] ORS;  Service: Orthopedics;  Laterality: Left;   VARICOSE VEIN SURGERY      Medical History: 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)    Allergic rhinitis    Anemia    Anxiety    Asthma    Atopic dermatitis, mild    B12 deficiency    Benign neoplasm of cecum    Cancer (HCC)    T2,    Dysuria    Episodic cluster headache, not intractable    Episodic cluster headache, not intractable    Gastroesophageal reflux disease without esophagitis    GERD (gastroesophageal reflux disease)    Headache    Hypertension    Leukopenia    Seizure-like activity (HCC)    Sleep apnea    Vitamin D  deficiency     Family History: Family History  Problem Relation Age of Onset   Hypertension Mother    Breast cancer Mother 47   Prostate cancer Father     Social History   Socioeconomic History   Marital status: Divorced    Spouse name: Not on file   Number of children: Not on file   Years of education: Not on file   Highest education level: Not on file  Occupational History   Not on file  Tobacco Use   Smoking status: Former    Current packs/day: 0.00    Average packs/day: 0.5 packs/day for 18.0 years (9.0 ttl pk-yrs)    Types: Cigarettes    Start date: 05/24/1993    Quit date: 05/25/2011    Years since quitting: 12.5    Passive exposure: Never   Smokeless tobacco: Never  Vaping Use   Vaping status: Never Used  Substance and Sexual Activity   Alcohol use: Yes    Comment: 1-2 cans of  beer almost every day   Drug use: No   Sexual activity: Not on file  Other Topics Concern   Not on file  Social History Narrative   Not on file   Social Drivers of Health   Financial Resource Strain: Not on file  Food Insecurity: No Food Insecurity  (05/09/2023)   Hunger Vital Sign    Worried About Running Out of Food in the Last Year: Never true    Ran Out of Food in the Last Year: Never true  Transportation Needs: No Transportation Needs (05/09/2023)   PRAPARE - Administrator, Civil Service (Medical): No    Lack of Transportation (Non-Medical): No  Physical Activity: Not on file  Stress: Not on file  Social Connections: Not on file  Intimate Partner Violence: Not At Risk (05/09/2023)   Humiliation, Afraid, Rape, and Kick questionnaire    Fear of  Current or Ex-Partner: No    Emotionally Abused: No    Physically Abused: No    Sexually Abused: No      Review of Systems  Constitutional:  Negative for chills, fatigue and unexpected weight change.  HENT:  Positive for congestion, postnasal drip and sinus pressure. Negative for rhinorrhea, sneezing and sore throat.   Eyes:  Negative for redness.  Respiratory:  Negative for cough, chest tightness and shortness of breath.   Cardiovascular:  Negative for chest pain and palpitations.  Gastrointestinal:  Negative for abdominal pain, constipation, diarrhea, nausea and vomiting.  Genitourinary:  Negative for dysuria and frequency.  Musculoskeletal:  Negative for joint swelling and neck pain.  Skin:  Negative for rash.  Neurological: Negative.  Negative for tremors and numbness.  Hematological:  Negative for adenopathy. Does not bruise/bleed easily.  Psychiatric/Behavioral:  Negative for behavioral problems (Depression), sleep disturbance and suicidal ideas. The patient is not nervous/anxious.     Vital Signs: BP 106/60   Pulse 75   Temp 98.3 F (36.8 C)   Resp 16   Ht 5' 6 (1.676 m)   Wt 192 lb (87.1 kg)   LMP 02/08/2015   SpO2 96%   BMI 30.99 kg/m    Physical Exam Vitals and nursing note reviewed.  Constitutional:      General: She is not in acute distress.    Appearance: She is well-developed. She is not diaphoretic.  HENT:     Head: Normocephalic and  atraumatic.  Eyes:     Extraocular Movements: Extraocular movements intact.  Neck:     Thyroid : No thyromegaly.     Vascular: No JVD.     Trachea: No tracheal deviation.  Cardiovascular:     Rate and Rhythm: Normal rate and regular rhythm.     Heart sounds: Normal heart sounds. No murmur heard.    No friction rub. No gallop.  Pulmonary:     Effort: Pulmonary effort is normal. No respiratory distress.     Breath sounds: No wheezing or rales.  Chest:     Chest wall: No tenderness.  Musculoskeletal:        General: Normal range of motion.  Skin:    General: Skin is warm and dry.  Neurological:     Mental Status: She is alert and oriented to person, place, and time.  Psychiatric:        Behavior: Behavior normal.        Thought Content: Thought content normal.        Judgment: Judgment normal.        Assessment/Plan: 1. Chronic sphenoidal sinusitis (Primary) Will go ahead and treat as pt reports sinus congestion. If not improving consider ENT for chronic findings - azithromycin  (ZITHROMAX ) 250 MG tablet; Take 2 tablets on day 1, then 1 tablet daily on days 2 through 5  Dispense: 6 tablet; Refill: 0  2. Essential hypertension Borderline low in office and will monitor. May need to decrease medication in future if low  3. Mixed hyperlipidemia - Lipid Panel With LDL/HDL Ratio  4. B12 deficiency - B12 and Folate Panel  5. Vitamin D  deficiency - VITAMIN D  25 Hydroxy (Vit-D Deficiency, Fractures)  6. Abnormal thyroid  exam - TSH + free T4  7. Other fatigue - CBC w/Diff/Platelet - Comprehensive metabolic panel with GFR - TSH + free T4 - Lipid Panel With LDL/HDL Ratio - B12 and Folate Panel - VITAMIN D  25 Hydroxy (Vit-D Deficiency, Fractures) - Fe+TIBC+Fer   General Counseling:  Elcie verbalizes understanding of the findings of todays visit and agrees with plan of treatment. I have discussed any further diagnostic evaluation that may be needed or ordered today. We also  reviewed her medications today. she has been encouraged to call the office with any questions or concerns that should arise related to todays visit.    Orders Placed This Encounter  Procedures   CBC w/Diff/Platelet   Comprehensive metabolic panel with GFR   TSH + free T4   Lipid Panel With LDL/HDL Ratio   B12 and Folate Panel   VITAMIN D  25 Hydroxy (Vit-D Deficiency, Fractures)   Fe+TIBC+Fer    Meds ordered this encounter  Medications   azithromycin  (ZITHROMAX ) 250 MG tablet    Sig: Take 2 tablets on day 1, then 1 tablet daily on days 2 through 5    Dispense:  6 tablet    Refill:  0    This patient was seen by Tinnie Pro, PA-C in collaboration with Dr. Sigrid Bathe as a part of collaborative care agreement.   Total time spent:30 Minutes Time spent includes review of chart, medications, test results, and follow up plan with the patient.      Dr Fozia M Khan Internal medicine

## 2023-12-13 ENCOUNTER — Other Ambulatory Visit: Payer: Self-pay

## 2023-12-15 ENCOUNTER — Other Ambulatory Visit: Payer: Self-pay

## 2023-12-21 ENCOUNTER — Other Ambulatory Visit: Payer: Self-pay

## 2024-01-18 ENCOUNTER — Other Ambulatory Visit: Payer: Self-pay

## 2024-01-21 ENCOUNTER — Other Ambulatory Visit
Admission: RE | Admit: 2024-01-21 | Discharge: 2024-01-21 | Disposition: A | Discharge status: Home / Self Care | Source: Ambulatory Visit | Attending: Physician Assistant | Admitting: Physician Assistant

## 2024-01-21 DIAGNOSIS — E538 Deficiency of other specified B group vitamins: Secondary | ICD-10-CM | POA: Insufficient documentation

## 2024-01-21 DIAGNOSIS — E559 Vitamin D deficiency, unspecified: Secondary | ICD-10-CM | POA: Insufficient documentation

## 2024-01-21 DIAGNOSIS — E782 Mixed hyperlipidemia: Secondary | ICD-10-CM | POA: Diagnosis not present

## 2024-01-21 DIAGNOSIS — R5383 Other fatigue: Secondary | ICD-10-CM | POA: Diagnosis present

## 2024-01-21 DIAGNOSIS — R946 Abnormal results of thyroid function studies: Secondary | ICD-10-CM | POA: Diagnosis not present

## 2024-01-21 LAB — CBC WITH DIFFERENTIAL/PLATELET
Abs Immature Granulocytes: 0.01 K/uL (ref 0.00–0.07)
Basophils Absolute: 0 K/uL (ref 0.0–0.1)
Basophils Relative: 1 %
Eosinophils Absolute: 0.2 K/uL (ref 0.0–0.5)
Eosinophils Relative: 5 %
HCT: 40.3 % (ref 36.0–46.0)
Hemoglobin: 13.2 g/dL (ref 12.0–15.0)
Immature Granulocytes: 0 %
Lymphocytes Relative: 38 %
Lymphs Abs: 1.1 K/uL (ref 0.7–4.0)
MCH: 28.7 pg (ref 26.0–34.0)
MCHC: 32.8 g/dL (ref 30.0–36.0)
MCV: 87.6 fL (ref 80.0–100.0)
Monocytes Absolute: 0.3 K/uL (ref 0.1–1.0)
Monocytes Relative: 9 %
Neutro Abs: 1.4 K/uL — ABNORMAL LOW (ref 1.7–7.7)
Neutrophils Relative %: 47 %
Platelets: 188 K/uL (ref 150–400)
RBC: 4.6 MIL/uL (ref 3.87–5.11)
RDW: 15.2 % (ref 11.5–15.5)
WBC: 3 K/uL — ABNORMAL LOW (ref 4.0–10.5)
nRBC: 0 % (ref 0.0–0.2)

## 2024-01-21 LAB — IRON AND TIBC
Iron: 88 ug/dL (ref 28–170)
Saturation Ratios: 24 % (ref 10.4–31.8)
TIBC: 364 ug/dL (ref 250–450)
UIBC: 276 ug/dL

## 2024-01-21 LAB — COMPREHENSIVE METABOLIC PANEL WITH GFR
ALT: 13 U/L (ref 0–44)
AST: 19 U/L (ref 15–41)
Albumin: 4.1 g/dL (ref 3.5–5.0)
Alkaline Phosphatase: 57 U/L (ref 38–126)
Anion gap: 10 (ref 5–15)
BUN: 12 mg/dL (ref 6–20)
CO2: 27 mmol/L (ref 22–32)
Calcium: 9.1 mg/dL (ref 8.9–10.3)
Chloride: 105 mmol/L (ref 98–111)
Creatinine, Ser: 0.86 mg/dL (ref 0.44–1.00)
GFR, Estimated: >60 mL/min (ref >60)
Glucose, Bld: 89 mg/dL (ref 70–99)
Potassium: 4.1 mmol/L (ref 3.5–5.1)
Sodium: 142 mmol/L (ref 135–145)
Total Bilirubin: 0.7 mg/dL (ref 0.0–1.2)
Total Protein: 7.3 g/dL (ref 6.5–8.1)

## 2024-01-21 LAB — FOLATE: Folate: 7.5 ng/mL (ref >5.9)

## 2024-01-21 LAB — VITAMIN D 25 HYDROXY (VIT D DEFICIENCY, FRACTURES): Vit D, 25-Hydroxy: 49.9 ng/mL (ref 30–100)

## 2024-01-21 LAB — LIPID PANEL
Cholesterol: 175 mg/dL (ref 0–200)
HDL: 75 mg/dL (ref >40)
LDL Cholesterol: 66 mg/dL (ref 0–99)
Total CHOL/HDL Ratio: 2.3 ratio
Triglycerides: 171 mg/dL — ABNORMAL HIGH (ref <150)
VLDL: 34 mg/dL (ref 0–40)

## 2024-01-21 LAB — FERRITIN: Ferritin: 122 ng/mL (ref 11–307)

## 2024-01-21 LAB — VITAMIN B12: Vitamin B-12: 401 pg/mL (ref 180–914)

## 2024-01-21 LAB — TSH: TSH: 1.58 u[IU]/mL (ref 0.350–4.500)

## 2024-01-21 LAB — T4, FREE: Free T4: 0.66 ng/dL (ref 0.61–1.12)

## 2024-01-25 ENCOUNTER — Ambulatory Visit: Payer: Self-pay | Admitting: Physician Assistant

## 2024-01-25 NOTE — Telephone Encounter (Signed)
 Tried to discuss lab results with patient but phone was either disconnected or patient hung up.

## 2024-01-25 NOTE — Telephone Encounter (Signed)
-----   Message from Tinnie MARLA Pro sent at 01/25/2024  1:41 PM EDT ----- Please let her know that overall labs are stable. 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 Triglycerides improving. ----- Message ----- From: Rebecka, Lab In [place] Sent: 01/21/2024  12:36 PM EDT To: Tinnie MARLA Pro, PA-C

## 2024-02-17 ENCOUNTER — Other Ambulatory Visit: Payer: Self-pay

## 2024-02-21 ENCOUNTER — Other Ambulatory Visit: Payer: Self-pay

## 2024-02-21 ENCOUNTER — Other Ambulatory Visit: Payer: Self-pay | Admitting: Physician Assistant

## 2024-02-22 ENCOUNTER — Other Ambulatory Visit: Payer: Self-pay

## 2024-03-05 ENCOUNTER — Telehealth: Payer: Self-pay | Admitting: Physician Assistant

## 2024-03-05 NOTE — Telephone Encounter (Signed)
 Left vm and sent mychart message to confirm 03/09/24 appointment-Toni

## 2024-03-09 ENCOUNTER — Ambulatory Visit (INDEPENDENT_AMBULATORY_CARE_PROVIDER_SITE_OTHER): Payer: Self-pay | Admitting: Physician Assistant

## 2024-03-09 ENCOUNTER — Encounter: Payer: Self-pay | Admitting: Physician Assistant

## 2024-03-09 VITALS — BP 120/84 | HR 81 | Temp 98.3°F | Resp 16 | Ht 66.0 in | Wt 191.6 lb

## 2024-03-09 DIAGNOSIS — R921 Mammographic calcification found on diagnostic imaging of breast: Secondary | ICD-10-CM | POA: Diagnosis not present

## 2024-03-09 DIAGNOSIS — Z0001 Encounter for general adult medical examination with abnormal findings: Secondary | ICD-10-CM | POA: Diagnosis not present

## 2024-03-09 DIAGNOSIS — D72829 Elevated white blood cell count, unspecified: Secondary | ICD-10-CM

## 2024-03-09 NOTE — Progress Notes (Signed)
 [facility] [address] 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 States her use of alcohol fluctuates and is working on decreasing some more now -last pap abnormal and went to GYN for LEEP 1 year ago, She will call to check about follow up on this -Colonoscopy due next year  Current Medication: Outpatient Encounter Medications as of 03/09/2024  Medication Sig   acetaminophen  (TYLENOL ) 500 MG tablet Take 2 tablets (1,000 mg total) by mouth every 8 (eight) hours.   albuterol  (PROVENTIL ) (2.5 MG/3ML) 0.083% nebulizer solution Take 3 mLs (2.5 mg total) by nebulization every 6 (six) hours as needed for wheezing or shortness of breath.   albuterol  (VENTOLIN  HFA) 108 (90 Base) MCG/ACT inhaler INHALE 1 PUFF INTO THE LUNGS EVERY 4 HOURS AS NEEDED   amLODipine  (NORVASC ) 2.5 MG tablet Take 1 tablet (2.5 mg total) by mouth daily.   busPIRone  (BUSPAR ) 10 MG tablet Take 1 tablet (10 mg total) by mouth 2 (two) times daily.   cyanocobalamin  (VITAMIN B12) 1000 MCG tablet Take 1,000 mcg by mouth daily.   fluticasone  (FLONASE ) 50 MCG/ACT nasal spray Place 1 spray into both nostrils as needed.   Fluticasone -Umeclidin-Vilant (TRELEGY ELLIPTA ) 100-62.5-25 MCG/ACT AEPB Inhale 1 puff into the lungs daily.   hydrocortisone  1 % ointment Apply 1 application topically 2 (two) times daily. (Patient taking differently: Apply 1 application  topically 2 (two) times daily as needed for  itching.)   rosuvastatin  (CRESTOR ) 5 MG tablet Take 1 tablet (5 mg total) by mouth 2 (two) times a week.   triamcinolone  ointment (KENALOG ) 0.5 % Apply 1 Application topically 2 (two) times daily. (Patient taking differently: Apply 1 Application topically 2 (two) times daily as needed (irritation).)   [DISCONTINUED] aspirin  EC (ASPIRIN  81) 81 MG tablet Take 1 tablet (81 mg total) by mouth 2 (two) times daily for 21 days   [DISCONTINUED] docusate sodium  (COLACE) 100 MG capsule Take 1 capsule (100 mg total) by mouth 2 (two) times daily.   [DISCONTINUED] enoxaparin  (LOVENOX ) 40 MG/0.4ML injection Inject 0.4 mLs (40 mg total) into the skin daily for 14 days.   [DISCONTINUED] ergocalciferol  (VITAMIN D2) 1.25 MG (50000 UT) capsule Take 1 capsule (50,000 Units total) by mouth once a week.   [DISCONTINUED] ondansetron  (ZOFRAN ) 4 MG tablet Take 1 tablet (4 mg total) by mouth every 6 (six) hours as needed for nausea.   [DISCONTINUED] traMADol  (ULTRAM ) 50 MG tablet Take 1 tablet (50 mg total) by mouth every 6 (six) hours as needed for moderate pain (pain score 4-6).   [DISCONTINUED] traMADol  (ULTRAM ) 50 MG tablet Take 1 tablet (50 mg total) by mouth every 6 (six) hours as needed for Pain   No facility-administered encounter medications on file as of 03/09/2024.    Surgical History: Past Surgical History:  Procedure Laterality Date   BREAST BIOPSY Left 2012   FIBROADENOMA, 1.0 CM.    BREAST LUMPECTOMY Left 2012   COLONOSCOPY N/A 01/28/2015   Procedure: COLONOSCOPY;  Surgeon: Louanne KANDICE Muse, MD;  Location: [facility] ENDOSCOPY;  Service: Endoscopy;  Laterality: N/A;   endometrial polyps  08/2010   resected   HERNIA REPAIR  2000   inguinal    LAPAROSCOPIC RIGHT HEMI COLECTOMY Right 02/25/2015   Procedure: LAPAROSCOPIC RIGHT HEMI COLECTOMY;  Surgeon: Louanne KANDICE Muse, MD;  Location: [facility] ORS;  Service: General;  Laterality: Right;   THYROIDECTOMY  1987   Right   TOTAL HIP ARTHROPLASTY Left  05/09/2023   Procedure: TOTAL HIP ARTHROPLASTY ANTERIOR APPROACH;  Surgeon: Lorelle Hussar, MD;  Location: [facility] ORS;  Service: Orthopedics;  Laterality: Left;   VARICOSE VEIN SURGERY      Medical History: 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)    Allergic rhinitis    Anemia    Anxiety    Asthma    Atopic dermatitis, mild    B12 deficiency    Benign neoplasm of cecum    Cancer (HCC)    T2,    Dysuria    Episodic cluster headache, not intractable    Episodic cluster headache, not intractable    Gastroesophageal reflux disease without esophagitis    GERD (gastroesophageal reflux disease)    Headache    Hypertension    Leukopenia    Seizure-like activity (HCC)    Sleep apnea    Vitamin D  deficiency     Family History: Family History  Problem Relation Age of Onset   Hypertension Mother    Breast cancer Mother 18   Prostate cancer Father       Review of Systems  Constitutional:  Negative for chills, fatigue and unexpected weight change.  HENT:  Negative for congestion, postnasal drip, rhinorrhea, sneezing and sore throat.   Eyes:  Negative for redness.  Respiratory:  Negative for cough, chest tightness and shortness of breath.   Cardiovascular:  Negative for chest pain and palpitations.  Gastrointestinal:  Negative for abdominal pain, constipation, diarrhea, nausea and vomiting.  Genitourinary:  Negative for dysuria and frequency.  Musculoskeletal:  Negative for back pain, joint swelling and neck pain.  Skin:  Negative for rash.  Neurological: Negative.  Negative for tremors and numbness.  Hematological:  Negative for adenopathy. Does not bruise/bleed easily.  Psychiatric/Behavioral:  Negative for behavioral problems (Depression), sleep disturbance and suicidal ideas. The patient is not nervous/anxious.      Vital Signs: BP 120/84   Pulse 81   Temp 98.3 F (36.8 C)   Resp 16   Ht 5' 6  (1.676 m)   Wt 191 lb 9.6 oz (86.9 kg)   LMP 02/08/2015   SpO2 100%   BMI 30.93 kg/m    Physical Exam Vitals and nursing note reviewed.  Constitutional:      General: She is not in acute distress.    Appearance: She is well-developed. She is not diaphoretic.  HENT:     Head: Normocephalic and atraumatic.  Eyes:     Extraocular Movements: Extraocular movements intact.  Neck:     Thyroid : No thyromegaly.     Vascular: No JVD.     Trachea: No tracheal deviation.  Cardiovascular:     Rate and Rhythm: Normal rate and regular rhythm.     Heart sounds: Normal heart sounds. No murmur heard.    No friction rub. No gallop.  Pulmonary:     Effort: Pulmonary effort is normal. No respiratory distress.     Breath sounds: No wheezing or rales.  Chest:     Chest wall: No tenderness.  Abdominal:     Palpations: Abdomen is soft.     Tenderness: There is no abdominal tenderness.  Musculoskeletal:        General: Normal range of motion.  Skin:    General: Skin is warm and dry.  Neurological:     Mental Status: She is alert and oriented to person, place, and time.  Psychiatric:        Behavior: Behavior normal.        Thought Content: Thought content normal.        Judgment: Judgment normal.      LABS: Recent Results (from the past 2160 hours)  CBC with Differential/Platelet     Status: Abnormal   Collection Time: 01/21/24 12:15 PM  Result Value Ref Range   WBC 3.0 (L) 4.0 - 10.5 K/uL   RBC 4.60 3.87 - 5.11 MIL/uL   Hemoglobin 13.2 12.0 - 15.0 g/dL   HCT 59.6 63.9 - 53.9 %   MCV 87.6 80.0 - 100.0 fL   MCH 28.7 26.0 - 34.0 pg   MCHC 32.8 30.0 - 36.0 g/dL   RDW 84.7 88.4 - 84.4 %   Platelets 188 150 - 400 K/uL   nRBC 0.0 0.0 - 0.2 %   Neutrophils Relative % 47 %   Neutro Abs 1.4 (L) 1.7 - 7.7 K/uL   Lymphocytes Relative 38 %   Lymphs Abs 1.1 0.7 - 4.0 K/uL   Monocytes Relative 9 %   Monocytes Absolute 0.3 0.1 - 1.0 K/uL   Eosinophils Relative 5 %   Eosinophils Absolute  0.2 0.0 - 0.5 K/uL   Basophils Relative 1 %   Basophils Absolute 0.0 0.0 - 0.1 K/uL   Immature Granulocytes 0 %   Abs Immature Granulocytes 0.01 0.00 - 0.07 K/uL    Comment: Performed at [facility], [address] [place], [place] [address]  Comprehensive metabolic panel     Status: None   Collection Time: 01/21/24 12:15 PM  Result Value Ref Range   Sodium 142 135 - 145 mmol/L   Potassium 4.1 3.5 - 5.1 mmol/L   Chloride 105 98 - 111 mmol/L   CO2 27 22 - 32 mmol/L   Glucose, Bld 89 70 - 99 mg/dL    Comment: Glucose reference range applies only to samples taken after fasting for at least 8 hours.   BUN 12 6 - 20 mg/dL   Creatinine, Ser 9.13 0.44 - 1.00 mg/dL   Calcium  9.1 8.9 - 10.3 mg/dL   Total Protein 7.3 6.5 - 8.1 g/dL   Albumin 4.1 3.5 - 5.0 g/dL   AST 19 15 - 41 U/L   ALT 13 0 - 44 U/L   Alkaline Phosphatase 57 38 - 126 U/L   Total Bilirubin 0.7 0.0 - 1.2 mg/dL   GFR, Estimated >39 >39 mL/min    Comment: (NOTE) Calculated using the CKD-EPI Creatinine Equation (2021)    Anion gap 10 5 - 15    Comment: Performed at [facility], [address] [place], [place] [address]  TSH     Status: None   Collection Time: 01/21/24 12:15 PM  Result Value Ref Range   TSH 1.580 0.350 - 4.500 uIU/mL    Comment: Performed by a 3rd Generation assay with a functional sensitivity of <=0.01 uIU/mL. Performed at [facility], [address], [place], [place] [address]   T4, free     Status:  None   Collection Time: 01/21/24 12:15 PM  Result Value Ref Range   Free T4 0.66 0.61 - 1.12 ng/dL    Comment: (NOTE) Biotin ingestion may interfere with free T4 tests. If the results are inconsistent with the TSH level, previous test results, or the clinical presentation, then consider biotin interference. If needed, order repeat testing after stopping biotin. Performed at [facility], [address] [place], [place] [address]   Lipid panel     Status:  Abnormal   Collection Time: 01/21/24 12:15 PM  Result Value Ref Range   Cholesterol 175 0 - 200 mg/dL   Triglycerides 828 (H) <150 mg/dL   HDL 75 >59 mg/dL   Total CHOL/HDL Ratio 2.3 RATIO   VLDL 34 0 - 40 mg/dL   LDL Cholesterol 66 0 - 99 mg/dL    Comment:        Total Cholesterol/HDL:CHD Risk Coronary Heart Disease Risk Table                     Men   Women  1/2 Average Risk   3.4   3.3  Average Risk       5.0   4.4  2 X Average Risk   9.6   7.1  3 X Average Risk  23.4   11.0        Use the calculated Patient Ratio above and the CHD Risk Table to determine the patient's CHD Risk.        ATP III CLASSIFICATION (LDL):  <100     mg/dL   Optimal  899-870  mg/dL   Near or Above                    Optimal  130-159  mg/dL   Borderline  839-810  mg/dL   High  >809     mg/dL   Very High Performed at [facility], [address] [place], [place] [address]   Vitamin B12     Status: None   Collection Time: 01/21/24 12:15 PM  Result Value Ref Range   Vitamin B-12 401 180 - 914 pg/mL    Comment: (NOTE) This assay is not validated for testing neonatal or myeloproliferative syndrome specimens for Vitamin B12 levels. Performed at [facility], [address], [place], [place] [address]   Folate     Status: None   Collection Time: 01/21/24 12:15 PM  Result Value Ref Range   Folate 7.5 >5.9 ng/mL    Comment: Performed at [facility], [address] [place], [place] [address]  VITAMIN D  25 Hydroxy (Vit-D Deficiency, Fractures)     Status: None   Collection Time: 01/21/24 12:15 PM  Result Value Ref Range   Vit D, 25-Hydroxy 49.90 30 - 100 ng/mL    Comment: (NOTE) Vitamin D  deficiency has been defined by the Institute of Medicine  and an Endocrine Society practice guideline as a level of serum 25-OH  vitamin D  less than 20 ng/mL (1,2). The Endocrine Society went on to  further define vitamin D  insufficiency as a level between 21 and 29  ng/mL (2).  1.  IOM (Institute of Medicine). 2010. Dietary reference intakes for  calcium  and D. [place]: [employer]. 2. Holick MF, Binkley , Bischoff-Ferrari HA, et al. Evaluation,  treatment, and prevention of vitamin D  deficiency: an Endocrine  Society clinical practice guideline, JCEM. 2011 Jul; 96(7): 1911-30.  Performed at [facility]  [facility], [address], [place], [place] [address]   Ferritin     Status: None   Collection Time: 01/21/24 12:15 PM  Result Value Ref Range   Ferritin 122 11 - 307 ng/mL    Comment: Performed at [facility], [address] [place], [place] [address]  Iron  and TIBC     Status: None   Collection Time: 01/21/24 12:15 PM  Result Value Ref Range   Iron  88 28 - 170 ug/dL   TIBC 635 749 - 549 ug/dL   Saturation Ratios 24 10.4 - 31.8 %   UIBC 276 ug/dL    Comment: Performed at [facility], [address], [place], [place] [address]        Assessment/Plan: 1. Encounter for general adult medical examination with abnormal findings (Primary) CPE performed, labs reviewed, due for mammogram, will follow up with GYN regarding hx of LEEP  2. Leukocytosis, unspecified type Will monitor, appears chronic, She is working on decreasing alcohol intake - CBC w/Diff/Platelet  3. Breast calcifications on mammogram Will move forward with scheduling diagnostic mammogram and US    General Counseling: Leshonda verbalizes understanding of the findings of todays visit and agrees with plan of treatment. I have discussed any further diagnostic evaluation that may be needed or ordered today. We also reviewed her medications today. she has been encouraged to call the office with any questions or concerns that should arise related to todays visit.    Counseling:    Orders Placed This Encounter  Procedures   CBC w/Diff/Platelet    No orders of the defined types were placed in this encounter.   This patient was seen by Tinnie Pro, PA-C in collaboration with Dr. Sigrid Bathe as a part of collaborative care agreement.  Total time spent:35 Minutes  Time spent includes review of chart, medications, test results, and follow up plan with the patient.     Sigrid CHRISTELLA Bathe, MD  Internal Medicine

## 2024-03-20 ENCOUNTER — Ambulatory Visit: Payer: Self-pay | Admitting: Internal Medicine

## 2024-03-23 ENCOUNTER — Ambulatory Visit
Admission: RE | Admit: 2024-03-23 | Discharge: 2024-03-23 | Disposition: A | Discharge status: Home / Self Care | Source: Ambulatory Visit | Attending: Physician Assistant | Admitting: Physician Assistant

## 2024-03-23 DIAGNOSIS — Z1231 Encounter for screening mammogram for malignant neoplasm of breast: Secondary | ICD-10-CM | POA: Diagnosis present

## 2024-03-23 DIAGNOSIS — N6489 Other specified disorders of breast: Secondary | ICD-10-CM

## 2024-03-23 DIAGNOSIS — R921 Mammographic calcification found on diagnostic imaging of breast: Secondary | ICD-10-CM | POA: Insufficient documentation

## 2024-03-26 ENCOUNTER — Encounter: Payer: Self-pay | Admitting: Internal Medicine

## 2024-03-26 ENCOUNTER — Ambulatory Visit (INDEPENDENT_AMBULATORY_CARE_PROVIDER_SITE_OTHER): Admitting: Internal Medicine

## 2024-03-26 VITALS — BP 140/86 | HR 95 | Temp 98.0°F | Resp 16 | Ht 66.0 in | Wt 191.0 lb

## 2024-03-26 DIAGNOSIS — J4489 Other specified chronic obstructive pulmonary disease: Secondary | ICD-10-CM | POA: Diagnosis not present

## 2024-03-26 DIAGNOSIS — G4733 Obstructive sleep apnea (adult) (pediatric): Secondary | ICD-10-CM | POA: Diagnosis not present

## 2024-03-26 DIAGNOSIS — R0602 Shortness of breath: Secondary | ICD-10-CM

## 2024-03-26 NOTE — Patient Instructions (Signed)

## 2024-03-26 NOTE — Progress Notes (Signed)
 [facility] [address] [place], [place] [address]  Pulmonary Sleep Medicine   Office Visit Note  Patient Name: Briana Snyder DOB: Aug 02, 1965 MRN [id number]  Date of Service: 03/26/2024  Complaints/HPI: She has felt a little congested and recently she states she took abx with no real benefits. Patient states she is using her inhalers as prescribed trelegy and albuterol . She notes her nose is stuffy. She sometimes uses flonase . Patient states she is not coughing with no sputum. She is using her CPAP device as prescribed and states that she is doing better. Patient has no issues with the device. She uses the CPAP all night when she uses it. She tries 4x per week to use it  Office Spirometry Results:     ROS  General: (-) fever, (-) chills, (-) night sweats, (-) weakness Skin: (-) rashes, (-) itching,. Eyes: (-) visual changes, (-) redness, (-) itching. Nose and Sinuses: (-) nasal stuffiness or itchiness, (-) postnasal drip, (-) nosebleeds, (-) sinus trouble. Mouth and Throat: (-) sore throat, (-) hoarseness. Neck: (-) swollen glands, (-) enlarged thyroid , (-) neck pain. Respiratory: - cough, (-) bloody sputum, + shortness of breath, - wheezing. Cardiovascular: - ankle swelling, (-) chest pain. Lymphatic: (-) lymph node enlargement. Neurologic: (-) numbness, (-) tingling. Psychiatric: (-) anxiety, (-) depression   Current Medication: Outpatient Encounter Medications as of 03/26/2024  Medication Sig   acetaminophen  (TYLENOL ) 500 MG tablet Take 2 tablets (1,000 mg total) by mouth every 8 (eight) hours.   albuterol  (PROVENTIL ) (2.5 MG/3ML) 0.083% nebulizer solution Take 3 mLs (2.5 mg total) by nebulization every 6 (six) hours as needed for wheezing or shortness of breath.   albuterol  (VENTOLIN  HFA) 108 (90 Base) MCG/ACT inhaler INHALE 1 PUFF INTO THE LUNGS EVERY 4 HOURS AS NEEDED   amLODipine  (NORVASC ) 2.5 MG tablet Take 1 tablet (2.5 mg total) by mouth daily.    busPIRone  (BUSPAR ) 10 MG tablet Take 1 tablet (10 mg total) by mouth 2 (two) times daily.   cyanocobalamin  (VITAMIN B12) 1000 MCG tablet Take 1,000 mcg by mouth daily.   fluticasone  (FLONASE ) 50 MCG/ACT nasal spray Place 1 spray into both nostrils as needed.   Fluticasone -Umeclidin-Vilant (TRELEGY ELLIPTA ) 100-62.5-25 MCG/ACT AEPB Inhale 1 puff into the lungs daily.   hydrocortisone  1 % ointment Apply 1 application topically 2 (two) times daily. (Patient taking differently: Apply 1 application  topically 2 (two) times daily as needed for itching.)   rosuvastatin  (CRESTOR ) 5 MG tablet Take 1 tablet (5 mg total) by mouth 2 (two) times a week.   triamcinolone  ointment (KENALOG ) 0.5 % Apply 1 Application topically 2 (two) times daily. (Patient taking differently: Apply 1 Application topically 2 (two) times daily as needed (irritation).)   No facility-administered encounter medications on file as of 03/26/2024.    Surgical History: Past Surgical History:  Procedure Laterality Date   BREAST BIOPSY Left 2012   FIBROADENOMA, 1.0 CM.    BREAST LUMPECTOMY Left 2012   COLONOSCOPY N/A 01/28/2015   Procedure: COLONOSCOPY;  Surgeon: Louanne KANDICE Muse, MD;  Location: [facility];  Service: Endoscopy;  Laterality: N/A;   endometrial polyps  08/2010   resected   HERNIA REPAIR  2000   inguinal    LAPAROSCOPIC RIGHT HEMI COLECTOMY Right 02/25/2015   Procedure: LAPAROSCOPIC RIGHT HEMI COLECTOMY;  Surgeon: Louanne KANDICE Muse, MD;  Location: [facility] ORS;  Service: General;  Laterality: Right;   THYROIDECTOMY  1987   Right   TOTAL HIP ARTHROPLASTY Left 05/09/2023  Procedure: TOTAL HIP ARTHROPLASTY ANTERIOR APPROACH;  Surgeon: Lorelle Hussar, MD;  Location: [facility] ORS;  Service: Orthopedics;  Laterality: Left;   VARICOSE VEIN SURGERY      Medical History: 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)    Allergic rhinitis    Anemia     Anxiety    Asthma    Atopic dermatitis, mild    B12 deficiency    Benign neoplasm of cecum    Cancer (HCC)    T2,    Dysuria    Episodic cluster headache, not intractable    Episodic cluster headache, not intractable    Gastroesophageal reflux disease without esophagitis    GERD (gastroesophageal reflux disease)    Headache    Hypertension    Leukopenia    Seizure-like activity (HCC)    Sleep apnea    Vitamin D  deficiency     Family History: Family History  Problem Relation Age of Onset   Hypertension Mother    Breast cancer Mother 90   Prostate cancer Father     Social History: Social History   Socioeconomic History   Marital status: Divorced    Spouse name: Not on file   Number of children: Not on file   Years of education: Not on file   Highest education level: Not on file  Occupational History   Not on file  Tobacco Use   Smoking status: Former    Current packs/day: 0.00    Average packs/day: 0.5 packs/day for 18.0 years (9.0 ttl pk-yrs)    Types: Cigarettes    Start date: 05/24/1993    Quit date: 05/25/2011    Years since quitting: 12.8    Passive exposure: Never   Smokeless tobacco: Never  Vaping Use   Vaping status: Never Used  Substance and Sexual Activity   Alcohol use: Yes    Comment: 1-2 cans of  beer almost every day   Drug use: No   Sexual activity: Not on file  Other Topics Concern   Not on file  Social History Narrative   Not on file   Social Drivers of Health   Financial Resource Strain: Not on file  Food Insecurity: No Food Insecurity (05/09/2023)   Hunger Vital Sign    Worried About Running Out of Food in the Last Year: Never true    Ran Out of Food in the Last Year: Never true  Transportation Needs: No Transportation Needs (05/09/2023)   PRAPARE - Administrator, Civil Service (Medical): No    Lack of Transportation (Non-Medical): No  Physical Activity: Not on file  Stress: Not on file  Social Connections: Not on file   Intimate Partner Violence: Not At Risk (05/09/2023)   Humiliation, Afraid, Rape, and Kick questionnaire    Fear of Current or Ex-Partner: No    Emotionally Abused: No    Physically Abused: No    Sexually Abused: No    Vital Signs: Blood pressure (!) 140/86, pulse 95, temperature 98 F (36.7 C), resp. rate 16, height 5' 6 (1.676 m), weight 191 lb (86.6 kg), last menstrual period 02/08/2015, SpO2 99%.  Examination: General Appearance: The patient is well-developed, well-nourished, and in no distress. Skin: Gross inspection of skin unremarkable. Head: normocephalic, no gross deformities. Eyes: no gross deformities noted. ENT: ears appear grossly normal no exudates. Neck: Supple. No thyromegaly. No LAD. Respiratory: no rhonchi noted. Cardiovascular: Normal S1 and S2 without murmur  or rub. Extremities: No cyanosis. pulses are equal. Neurologic: Alert and oriented. No involuntary movements.  LABS: Recent Results (from the past 2160 hours)  CBC with Differential/Platelet     Status: Abnormal   Collection Time: 01/21/24 12:15 PM  Result Value Ref Range   WBC 3.0 (L) 4.0 - 10.5 K/uL   RBC 4.60 3.87 - 5.11 MIL/uL   Hemoglobin 13.2 12.0 - 15.0 g/dL   HCT 59.6 63.9 - 53.9 %   MCV 87.6 80.0 - 100.0 fL   MCH 28.7 26.0 - 34.0 pg   MCHC 32.8 30.0 - 36.0 g/dL   RDW 84.7 88.4 - 84.4 %   Platelets 188 150 - 400 K/uL   nRBC 0.0 0.0 - 0.2 %   Neutrophils Relative % 47 %   Neutro Abs 1.4 (L) 1.7 - 7.7 K/uL   Lymphocytes Relative 38 %   Lymphs Abs 1.1 0.7 - 4.0 K/uL   Monocytes Relative 9 %   Monocytes Absolute 0.3 0.1 - 1.0 K/uL   Eosinophils Relative 5 %   Eosinophils Absolute 0.2 0.0 - 0.5 K/uL   Basophils Relative 1 %   Basophils Absolute 0.0 0.0 - 0.1 K/uL   Immature Granulocytes 0 %   Abs Immature Granulocytes 0.01 0.00 - 0.07 K/uL    Comment: Performed at [facility], [address] [place], [place] [address]  Comprehensive metabolic panel     Status: None    Collection Time: 01/21/24 12:15 PM  Result Value Ref Range   Sodium 142 135 - 145 mmol/L   Potassium 4.1 3.5 - 5.1 mmol/L   Chloride 105 98 - 111 mmol/L   CO2 27 22 - 32 mmol/L   Glucose, Bld 89 70 - 99 mg/dL    Comment: Glucose reference range applies only to samples taken after fasting for at least 8 hours.   BUN 12 6 - 20 mg/dL   Creatinine, Ser 9.13 0.44 - 1.00 mg/dL   Calcium  9.1 8.9 - 10.3 mg/dL   Total Protein 7.3 6.5 - 8.1 g/dL   Albumin 4.1 3.5 - 5.0 g/dL   AST 19 15 - 41 U/L   ALT 13 0 - 44 U/L   Alkaline Phosphatase 57 38 - 126 U/L   Total Bilirubin 0.7 0.0 - 1.2 mg/dL   GFR, Estimated >39 >39 mL/min    Comment: (NOTE) Calculated using the CKD-EPI Creatinine Equation (2021)    Anion gap 10 5 - 15    Comment: Performed at [facility], [address] [place], [place] [address]  TSH     Status: None   Collection Time: 01/21/24 12:15 PM  Result Value Ref Range   TSH 1.580 0.350 - 4.500 uIU/mL    Comment: Performed by a 3rd Generation assay with a functional sensitivity of <=0.01 uIU/mL. Performed at [facility], [address] [place], [place] [address]   T4, free     Status: None   Collection Time: 01/21/24 12:15 PM  Result Value Ref Range   Free T4 0.66 0.61 - 1.12 ng/dL    Comment: (NOTE) Biotin ingestion may interfere with free T4 tests. If the results are inconsistent with the TSH level, previous test results, or the clinical presentation, then consider biotin interference. If needed, order repeat testing after stopping biotin. Performed at [facility], [address], [place], [place] [address]   Lipid panel     Status: Abnormal   Collection Time: 01/21/24 12:15 PM  Result Value  Ref Range   Cholesterol 175 0 - 200 mg/dL   Triglycerides 828 (H) <150 mg/dL   HDL 75 >59 mg/dL   Total CHOL/HDL Ratio 2.3 RATIO   VLDL 34 0 - 40 mg/dL   LDL Cholesterol 66 0 - 99 mg/dL    Comment:        Total Cholesterol/HDL:CHD  Risk Coronary Heart Disease Risk Table                     Men   Women  1/2 Average Risk   3.4   3.3  Average Risk       5.0   4.4  2 X Average Risk   9.6   7.1  3 X Average Risk  23.4   11.0        Use the calculated Patient Ratio above and the CHD Risk Table to determine the patient's CHD Risk.        ATP III CLASSIFICATION (LDL):  <100     mg/dL   Optimal  899-870  mg/dL   Near or Above                    Optimal  130-159  mg/dL   Borderline  839-810  mg/dL   High  >809     mg/dL   Very High Performed at [facility], [address] [place], [place] [address]   Vitamin B12     Status: None   Collection Time: 01/21/24 12:15 PM  Result Value Ref Range   Vitamin B-12 401 180 - 914 pg/mL    Comment: (NOTE) This assay is not validated for testing neonatal or myeloproliferative syndrome specimens for Vitamin B12 levels. Performed at [facility], [address], [place], [place] [address]   Folate     Status: None   Collection Time: 01/21/24 12:15 PM  Result Value Ref Range   Folate 7.5 >5.9 ng/mL    Comment: Performed at [facility], [address] [place], [place] [address]  VITAMIN D  25 Hydroxy (Vit-D Deficiency, Fractures)     Status: None   Collection Time: 01/21/24 12:15 PM  Result Value Ref Range   Vit D, 25-Hydroxy 49.90 30 - 100 ng/mL    Comment: (NOTE) Vitamin D  deficiency has been defined by the Institute of Medicine  and an Endocrine Society practice guideline as a level of serum 25-OH  vitamin D  less than 20 ng/mL (1,2). The Endocrine Society went on to  further define vitamin D  insufficiency as a level between 21 and 29  ng/mL (2).  1. IOM (Institute of Medicine). 2010. Dietary reference intakes for  calcium  and D. [place]: [employer]. 2. Holick MF, Binkley [place], Bischoff-Ferrari HA, et al. Evaluation,  treatment, and prevention of vitamin D  deficiency: an Endocrine  Society clinical practice guideline,  JCEM. 2011 Jul; 96(7): 1911-30.  Performed at [facility], [address] [place], [place] [address]   Ferritin     Status: None   Collection Time: 01/21/24 12:15 PM  Result Value Ref Range   Ferritin 122 11 - 307 ng/mL    Comment: Performed at [facility], [address] [place], [place] [address]  Iron  and TIBC     Status: None   Collection Time: 01/21/24 12:15 PM  Result Value Ref Range   Iron  88 28 - 170 ug/dL   TIBC 635 749 - 549 ug/dL  Saturation Ratios 24 10.4 - 31.8 %   UIBC 276 ug/dL    Comment: Performed at [facility], [address] [place], [place] [address]    Radiology: MM 3D DIAGNOSTIC MAMMOGRAM BILATERAL BREAST Result Date: 03/23/2024 CLINICAL DATA:  LEFT breast calcifications and asymmetry follow-up, 24 month time point. Due for annual. EXAM: DIGITAL DIAGNOSTIC BILATERAL MAMMOGRAM WITH TOMOSYNTHESIS AND CAD TECHNIQUE: Bilateral digital diagnostic mammography and breast tomosynthesis was performed. The images were evaluated with computer-aided detection. COMPARISON:  Previous exam(s). ACR Breast Density Category b: There are scattered areas of fibroglandular density. FINDINGS: LEFT breast calcifications and asymmetry today are consistent with benign dystrophic/rim calcifications, likely secondary to fat necrosis. Although progressively coarsened over time, they are not significantly changed in size from November 2023. There is no mammographic evidence of malignancy in EITHER breast. IMPRESSION: The previously described LEFT breast calcifications and asymmetry today have a definitively benign morphology and demonstrate approximately 2 years of stability in size. No specific further imaging follow-up is needed. There is no mammographic evidence of malignancy in EITHER breast. Patient may return to routine screening. RECOMMENDATION: Patient may return to routine screening mammogram in 1 year.(Code:SM-B-01Y) I have discussed the findings and  recommendations with the patient. If applicable, a reminder letter will be sent to the patient regarding the next appointment. BI-RADS CATEGORY  2: Benign. Electronically Signed   By: Norleen Croak M.D.   On: 03/23/2024 11:06    MM 3D DIAGNOSTIC MAMMOGRAM BILATERAL BREAST Result Date: 03/23/2024 CLINICAL DATA:  LEFT breast calcifications and asymmetry follow-up, 24 month time point. Due for annual. EXAM: DIGITAL DIAGNOSTIC BILATERAL MAMMOGRAM WITH TOMOSYNTHESIS AND CAD TECHNIQUE: Bilateral digital diagnostic mammography and breast tomosynthesis was performed. The images were evaluated with computer-aided detection. COMPARISON:  Previous exam(s). ACR Breast Density Category b: There are scattered areas of fibroglandular density. FINDINGS: LEFT breast calcifications and asymmetry today are consistent with benign dystrophic/rim calcifications, likely secondary to fat necrosis. Although progressively coarsened over time, they are not significantly changed in size from November 2023. There is no mammographic evidence of malignancy in EITHER breast. IMPRESSION: The previously described LEFT breast calcifications and asymmetry today have a definitively benign morphology and demonstrate approximately 2 years of stability in size. No specific further imaging follow-up is needed. There is no mammographic evidence of malignancy in EITHER breast. Patient may return to routine screening. RECOMMENDATION: Patient may return to routine screening mammogram in 1 year.(Code:[id number]-01Y) I have discussed the findings and recommendations with the patient. If applicable, a reminder letter will be sent to the patient regarding the next appointment. BI-RADS CATEGORY  2: Benign. Electronically Signed   By: Norleen Croak M.D.   On: 03/23/2024 11:06    MM 3D DIAGNOSTIC MAMMOGRAM BILATERAL BREAST Result Date: 03/23/2024 CLINICAL DATA:  LEFT breast calcifications and asymmetry follow-up, 24 month time point. Due for annual. EXAM: DIGITAL  DIAGNOSTIC BILATERAL MAMMOGRAM WITH TOMOSYNTHESIS AND CAD TECHNIQUE: Bilateral digital diagnostic mammography and breast tomosynthesis was performed. The images were evaluated with computer-aided detection. COMPARISON:  Previous exam(s). ACR Breast Density Category b: There are scattered areas of fibroglandular density. FINDINGS: LEFT breast calcifications and asymmetry today are consistent with benign dystrophic/rim calcifications, likely secondary to fat necrosis. Although progressively coarsened over time, they are not significantly changed in size from November 2023. There is no mammographic evidence of malignancy in EITHER breast. IMPRESSION: The previously described LEFT breast calcifications and asymmetry today have a definitively benign morphology and demonstrate approximately 2 years of stability in size. No specific  further imaging follow-up is needed. There is no mammographic evidence of malignancy in EITHER breast. Patient may return to routine screening. RECOMMENDATION: Patient may return to routine screening mammogram in 1 year.(Code:[id number]) I have discussed the findings and recommendations with the patient. If applicable, a reminder letter will be sent to the patient regarding the next appointment. BI-RADS CATEGORY  2: Benign. Electronically Signed   By: Norleen Croak M.D.   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) 12/07/2019   Dysuria 12/07/2019   Essential hypertension 10/22/2019   Atopic dermatitis, mild 08/07/2017   Vaginal candidiasis 08/07/2017   Episodic cluster headache, not intractable 08/07/2017   Mild intermittent asthma 08/07/2017   Encounter for general adult medical examination with abnormal  findings 08/07/2017   Abnormal Papanicolaou smear of cervix with positive human papilloma virus (HPV) test 08/07/2017   Allergic rhinitis 08/07/2017   Gastroesophageal reflux disease without esophagitis 08/07/2017   GAD (generalized anxiety disorder) 08/07/2017   Vitamin D  deficiency 08/07/2017   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 SOB (shortness of breath) (Primary) Her spiro shows a slight lower FEV1 from last time. She states she is using meds as prescribed - Spirometry with graph  2. OSA (obstructive sleep apnea) Encourage compliance with CPAP. She does on occasion miss using the device  3. Obesity, morbid (HCC) Obesity Counseling: Had a lengthy discussion regarding patients BMI and weight issues. Patient was instructed on portion control as well as increased activity. Also discussed caloric restrictions with trying to maintain intake less than 2000 Kcal.   4. Obstructive chronic bronchitis without exacerbation (HCC) On trelegy and albuterol . Seems to be under control. She has not had any admissions to the hospital   General Counseling: I have discussed the findings of the evaluation and examination with [place].  I have also discussed any further diagnostic evaluation thatmay be needed or ordered today. Evanthia verbalizes understanding of the findings of todays visit. We also reviewed her medications today and discussed drug interactions and side effects including but not limited excessive drowsiness and altered mental states. We also discussed that there is always a risk not just to her but also people around her. she has been encouraged to call the office with any questions or concerns that should arise related to todays visit.  Orders Placed This Encounter  Procedures   Spirometry with graph    Where should this test be performed?:   [facility]     Time spent: 21  I have personally obtained a history, examined the patient, evaluated laboratory and imaging  results, formulated the assessment and plan and placed orders.    Elfreda DELENA Bathe, MD [facility] Pulmonary and Critical Care Sleep medicine

## 2024-03-27 ENCOUNTER — Other Ambulatory Visit: Payer: Self-pay

## 2024-04-12 ENCOUNTER — Other Ambulatory Visit: Payer: Self-pay | Admitting: Physician Assistant

## 2024-04-12 ENCOUNTER — Other Ambulatory Visit: Payer: Self-pay

## 2024-04-12 DIAGNOSIS — I10 Essential (primary) hypertension: Secondary | ICD-10-CM

## 2024-04-12 MED ORDER — AMLODIPINE BESYLATE 2.5 MG PO TABS
2.5000 mg | ORAL_TABLET | Freq: Every day | ORAL | 1 refills | Status: AC
  Filled 2024-04-12: qty 90, 90d supply, fill #0

## 2024-04-13 ENCOUNTER — Other Ambulatory Visit: Payer: Self-pay

## 2024-04-14 ENCOUNTER — Other Ambulatory Visit: Payer: Self-pay

## 2024-04-16 ENCOUNTER — Other Ambulatory Visit: Payer: Self-pay

## 2024-04-27 ENCOUNTER — Other Ambulatory Visit
Admission: RE | Admit: 2024-04-27 | Discharge: 2024-04-27 | Disposition: A | Attending: Physician Assistant | Admitting: Physician Assistant

## 2024-04-27 LAB — CBC WITH DIFFERENTIAL/PLATELET
Abs Immature Granulocytes: 0.01 K/uL (ref 0.00–0.07)
Basophils Absolute: 0 K/uL (ref 0.0–0.1)
Basophils Relative: 1 %
Eosinophils Absolute: 0.2 K/uL (ref 0.0–0.5)
Eosinophils Relative: 4 %
HCT: 38.9 % (ref 36.0–46.0)
Hemoglobin: 12.8 g/dL (ref 12.0–15.0)
Immature Granulocytes: 0 %
Lymphocytes Relative: 36 %
Lymphs Abs: 1.4 K/uL (ref 0.7–4.0)
MCH: 28.8 pg (ref 26.0–34.0)
MCHC: 32.9 g/dL (ref 30.0–36.0)
MCV: 87.4 fL (ref 80.0–100.0)
Monocytes Absolute: 0.3 K/uL (ref 0.1–1.0)
Monocytes Relative: 8 %
Neutro Abs: 1.9 K/uL (ref 1.7–7.7)
Neutrophils Relative %: 51 %
Platelets: 173 K/uL (ref 150–400)
RBC: 4.45 MIL/uL (ref 3.87–5.11)
RDW: 15.1 % (ref 11.5–15.5)
WBC: 3.8 K/uL — ABNORMAL LOW (ref 4.0–10.5)
nRBC: 0 % (ref 0.0–0.2)

## 2024-05-02 ENCOUNTER — Other Ambulatory Visit: Payer: Self-pay | Admitting: Physician Assistant

## 2024-05-02 ENCOUNTER — Ambulatory Visit: Payer: Self-pay | Admitting: Physician Assistant

## 2024-05-02 ENCOUNTER — Other Ambulatory Visit: Payer: Self-pay

## 2024-05-02 DIAGNOSIS — J449 Chronic obstructive pulmonary disease, unspecified: Secondary | ICD-10-CM

## 2024-05-02 MED ORDER — ALBUTEROL SULFATE HFA 108 (90 BASE) MCG/ACT IN AERS
INHALATION_SPRAY | RESPIRATORY_TRACT | 7 refills | Status: AC
  Filled 2024-05-02: qty 6.7, 33d supply, fill #0

## 2024-05-02 NOTE — Telephone Encounter (Signed)
-----   Message from Tinnie MARLA Pro sent at 05/02/2024 11:56 AM EST ----- Please let her know that her WBC are still a little low which has been chronic for her, but improved since last check ----- Message ----- From: Interface, Lab In [place] Sent: 04/27/2024  11:16 AM EST To: Tinnie MARLA Pro, PA-C

## 2024-05-02 NOTE — Telephone Encounter (Signed)
 Lvm and notified patient via MyChart.

## 2024-05-09 ENCOUNTER — Other Ambulatory Visit: Payer: Self-pay

## 2024-06-04 ENCOUNTER — Other Ambulatory Visit: Payer: Self-pay

## 2024-06-08 ENCOUNTER — Other Ambulatory Visit: Payer: Self-pay

## 2024-06-09 ENCOUNTER — Other Ambulatory Visit: Payer: Self-pay

## 2024-06-12 ENCOUNTER — Other Ambulatory Visit: Payer: Self-pay

## 2025-03-14 ENCOUNTER — Encounter: Admitting: Physician Assistant

## 2025-04-01 ENCOUNTER — Ambulatory Visit: Admitting: Internal Medicine
